# Patient Record
Sex: Female | Born: 1975 | Race: Black or African American | Hispanic: No | Marital: Married | Smoking: Never smoker
Health system: Southern US, Community
[De-identification: ages and names within clinical notes are randomized; demographics above are authoritative.]

## PROBLEM LIST (undated history)

## (undated) ENCOUNTER — Inpatient Hospital Stay (HOSPITAL_COMMUNITY): Payer: Self-pay

## (undated) DIAGNOSIS — D219 Benign neoplasm of connective and other soft tissue, unspecified: Secondary | ICD-10-CM

## (undated) DIAGNOSIS — D649 Anemia, unspecified: Secondary | ICD-10-CM

## (undated) DIAGNOSIS — N736 Female pelvic peritoneal adhesions (postinfective): Secondary | ICD-10-CM

## (undated) DIAGNOSIS — J189 Pneumonia, unspecified organism: Secondary | ICD-10-CM

## (undated) DIAGNOSIS — N9489 Other specified conditions associated with female genital organs and menstrual cycle: Secondary | ICD-10-CM

## (undated) DIAGNOSIS — Z5189 Encounter for other specified aftercare: Secondary | ICD-10-CM

## (undated) DIAGNOSIS — N805 Endometriosis of intestine: Secondary | ICD-10-CM

## (undated) DIAGNOSIS — B999 Unspecified infectious disease: Secondary | ICD-10-CM

## (undated) DIAGNOSIS — L309 Dermatitis, unspecified: Secondary | ICD-10-CM

## (undated) DIAGNOSIS — D249 Benign neoplasm of unspecified breast: Secondary | ICD-10-CM

## (undated) DIAGNOSIS — I10 Essential (primary) hypertension: Secondary | ICD-10-CM

## (undated) HISTORY — DX: Benign neoplasm of connective and other soft tissue, unspecified: D21.9

## (undated) HISTORY — PX: DILATION AND CURETTAGE OF UTERUS: SHX78

## (undated) HISTORY — PX: OTHER SURGICAL HISTORY: SHX169

## (undated) HISTORY — DX: Benign neoplasm of unspecified breast: D24.9

## (undated) HISTORY — DX: Anemia, unspecified: D64.9

## (undated) HISTORY — DX: Female pelvic peritoneal adhesions (postinfective): N73.6

## (undated) HISTORY — DX: Dermatitis, unspecified: L30.9

## (undated) HISTORY — DX: Pneumonia, unspecified organism: J18.9

## (undated) HISTORY — DX: Endometriosis of intestine: N80.5

## (undated) HISTORY — DX: Unspecified infectious disease: B99.9

---

## 2002-02-12 DIAGNOSIS — D219 Benign neoplasm of connective and other soft tissue, unspecified: Secondary | ICD-10-CM

## 2002-02-12 HISTORY — DX: Benign neoplasm of connective and other soft tissue, unspecified: D21.9

## 2003-01-08 ENCOUNTER — Observation Stay (HOSPITAL_COMMUNITY): Admission: AD | Admit: 2003-01-08 | Discharge: 2003-01-09 | Payer: Self-pay | Admitting: Obstetrics and Gynecology

## 2003-03-18 ENCOUNTER — Inpatient Hospital Stay (HOSPITAL_COMMUNITY): Admission: AD | Admit: 2003-03-18 | Discharge: 2003-03-22 | Payer: Self-pay | Admitting: Obstetrics and Gynecology

## 2003-03-19 ENCOUNTER — Encounter (INDEPENDENT_AMBULATORY_CARE_PROVIDER_SITE_OTHER): Payer: Self-pay | Admitting: Specialist

## 2003-03-23 ENCOUNTER — Encounter: Admission: RE | Admit: 2003-03-23 | Discharge: 2003-04-22 | Payer: Self-pay | Admitting: Obstetrics and Gynecology

## 2003-04-11 ENCOUNTER — Inpatient Hospital Stay (HOSPITAL_COMMUNITY): Admission: AD | Admit: 2003-04-11 | Discharge: 2003-04-11 | Payer: Self-pay | Admitting: Obstetrics and Gynecology

## 2003-04-15 ENCOUNTER — Inpatient Hospital Stay (HOSPITAL_COMMUNITY): Admission: AD | Admit: 2003-04-15 | Discharge: 2003-04-25 | Payer: Self-pay | Admitting: Obstetrics and Gynecology

## 2003-04-20 ENCOUNTER — Encounter (INDEPENDENT_AMBULATORY_CARE_PROVIDER_SITE_OTHER): Payer: Self-pay | Admitting: *Deleted

## 2003-05-21 ENCOUNTER — Encounter: Admission: RE | Admit: 2003-05-21 | Discharge: 2003-06-20 | Payer: Self-pay | Admitting: Obstetrics and Gynecology

## 2003-06-02 ENCOUNTER — Other Ambulatory Visit: Admission: RE | Admit: 2003-06-02 | Discharge: 2003-06-02 | Payer: Self-pay | Admitting: Obstetrics and Gynecology

## 2003-06-04 ENCOUNTER — Encounter: Admission: RE | Admit: 2003-06-04 | Discharge: 2003-06-04 | Payer: Self-pay | Admitting: Obstetrics and Gynecology

## 2003-07-21 ENCOUNTER — Encounter: Admission: RE | Admit: 2003-07-21 | Discharge: 2003-08-20 | Payer: Self-pay | Admitting: Obstetrics and Gynecology

## 2003-09-20 ENCOUNTER — Encounter: Admission: RE | Admit: 2003-09-20 | Discharge: 2003-10-20 | Payer: Self-pay | Admitting: Obstetrics and Gynecology

## 2003-10-21 ENCOUNTER — Encounter: Admission: RE | Admit: 2003-10-21 | Discharge: 2003-11-20 | Payer: Self-pay | Admitting: Obstetrics and Gynecology

## 2003-12-21 ENCOUNTER — Encounter: Admission: RE | Admit: 2003-12-21 | Discharge: 2004-01-20 | Payer: Self-pay | Admitting: Obstetrics and Gynecology

## 2004-02-20 ENCOUNTER — Encounter: Admission: RE | Admit: 2004-02-20 | Discharge: 2004-03-21 | Payer: Self-pay | Admitting: Obstetrics and Gynecology

## 2004-03-22 ENCOUNTER — Encounter: Admission: RE | Admit: 2004-03-22 | Discharge: 2004-04-21 | Payer: Self-pay | Admitting: Obstetrics and Gynecology

## 2005-02-12 DIAGNOSIS — I10 Essential (primary) hypertension: Secondary | ICD-10-CM

## 2005-02-12 HISTORY — DX: Essential (primary) hypertension: I10

## 2005-06-06 ENCOUNTER — Other Ambulatory Visit: Admission: RE | Admit: 2005-06-06 | Discharge: 2005-06-06 | Payer: Self-pay | Admitting: Obstetrics and Gynecology

## 2006-10-05 ENCOUNTER — Encounter: Admission: RE | Admit: 2006-10-05 | Discharge: 2006-10-05 | Payer: Self-pay | Admitting: Obstetrics and Gynecology

## 2006-11-01 ENCOUNTER — Ambulatory Visit: Admission: RE | Admit: 2006-11-01 | Discharge: 2006-11-01 | Payer: Self-pay | Admitting: Gynecology

## 2007-02-13 DIAGNOSIS — N805 Endometriosis of intestine: Secondary | ICD-10-CM

## 2007-02-13 DIAGNOSIS — N80559 Endometriosis of other parts of the colon, unspecified depth: Secondary | ICD-10-CM

## 2007-02-13 HISTORY — DX: Endometriosis of other parts of the colon, unspecified depth: N80.559

## 2007-02-13 HISTORY — DX: Endometriosis of intestine: N80.5

## 2008-03-05 ENCOUNTER — Ambulatory Visit: Payer: Self-pay | Admitting: Radiology

## 2008-03-05 ENCOUNTER — Emergency Department (HOSPITAL_BASED_OUTPATIENT_CLINIC_OR_DEPARTMENT_OTHER): Admission: EM | Admit: 2008-03-05 | Discharge: 2008-03-05 | Payer: Self-pay | Admitting: Emergency Medicine

## 2009-11-16 ENCOUNTER — Ambulatory Visit: Payer: Self-pay | Admitting: Diagnostic Radiology

## 2009-11-16 ENCOUNTER — Emergency Department (HOSPITAL_BASED_OUTPATIENT_CLINIC_OR_DEPARTMENT_OTHER): Admission: EM | Admit: 2009-11-16 | Discharge: 2009-11-16 | Payer: Self-pay | Admitting: Emergency Medicine

## 2009-11-19 ENCOUNTER — Emergency Department (HOSPITAL_BASED_OUTPATIENT_CLINIC_OR_DEPARTMENT_OTHER): Admission: EM | Admit: 2009-11-19 | Discharge: 2009-11-19 | Payer: Self-pay | Admitting: Emergency Medicine

## 2010-04-27 LAB — POCT CARDIAC MARKERS
CKMB, poc: <1 ng/mL — ABNORMAL LOW (ref 1.0–8.0)
CKMB, poc: <1 ng/mL — ABNORMAL LOW (ref 1.0–8.0)
Myoglobin, poc: 25.6 ng/mL (ref 12–200)
Myoglobin, poc: 34.2 ng/mL (ref 12–200)
Troponin i, poc: <0.05 ng/mL (ref 0.00–0.09)
Troponin i, poc: <0.05 ng/mL (ref 0.00–0.09)

## 2010-04-27 LAB — CBC
HCT: 38.6 % (ref 36.0–46.0)
Hemoglobin: 12.6 g/dL (ref 12.0–15.0)
MCH: 26.7 pg (ref 26.0–34.0)
MCHC: 32.5 g/dL (ref 30.0–36.0)
MCV: 82 fL (ref 78.0–100.0)
Platelets: 226 10*3/uL (ref 150–400)
RBC: 4.7 MIL/uL (ref 3.87–5.11)
RDW: 11.2 % — ABNORMAL LOW (ref 11.5–15.5)
WBC: 3.3 10*3/uL — ABNORMAL LOW (ref 4.0–10.5)

## 2010-04-27 LAB — BASIC METABOLIC PANEL
BUN: 11 mg/dL (ref 6–23)
CO2: 30 mEq/L (ref 19–32)
Calcium: 9.5 mg/dL (ref 8.4–10.5)
Chloride: 102 mEq/L (ref 96–112)
Creatinine, Ser: 0.8 mg/dL (ref 0.4–1.2)
GFR calc Af Amer: >60 mL/min (ref >60)
GFR calc non Af Amer: >60 mL/min (ref >60)
Glucose, Bld: 99 mg/dL (ref 70–99)
Potassium: 3.7 mEq/L (ref 3.5–5.1)
Sodium: 142 mEq/L (ref 135–145)

## 2010-04-27 LAB — DIFFERENTIAL
Basophils Absolute: 0 10*3/uL (ref 0.0–0.1)
Basophils Relative: 2 % — ABNORMAL HIGH (ref 0–1)
Eosinophils Absolute: 0.2 10*3/uL (ref 0.0–0.7)
Eosinophils Relative: 5 % (ref 0–5)
Lymphocytes Relative: 38 % (ref 12–46)
Lymphs Abs: 1.2 10*3/uL (ref 0.7–4.0)
Monocytes Absolute: 0.3 10*3/uL (ref 0.1–1.0)
Monocytes Relative: 9 % (ref 3–12)
Neutro Abs: 1.6 10*3/uL — ABNORMAL LOW (ref 1.7–7.7)
Neutrophils Relative %: 47 % (ref 43–77)

## 2010-04-27 LAB — D-DIMER, QUANTITATIVE: D-Dimer, Quant: 0.41 ug/mL-FEU (ref 0.00–0.48)

## 2010-06-27 NOTE — Consult Note (Signed)
**Note Terry-Identified via Obfuscation** Terry Bryant, Terry Bryant              ACCOUNT NO.:  [id number]   MEDICAL RECORD NO.:  [id number]          PATIENT TYPE:  OUT   LOCATION:  GYN                          FACILITY:  [facility]   PHYSICIAN:  Terry Bryant, M.D.DATE OF BIRTH:  November 10, 1975   DATE OF CONSULTATION:  11/01/2006  DATE OF DISCHARGE:  11/01/2006                                 CONSULTATION   CHIEF COMPLAINT:  Elevated CA-125 and pelvic cyst.   [age]-year-old African-American female seen in consultation request of  Dr. Estanislado Bryant regarding elevated CA-125.  The patient's history is  predominantly obtained from the patient, as records are not available to  document all of the following history.  Apparently, the patient  developed a postpartum abscess approximately 1 month postpartum in 2005.  She underwent exploratory laparotomy and removal of the left ovarian  abscess.  She subsequently developed pain in her right side, and an MRI  was obtained in August 23rd showing a serpiginous fluid-filled structure  in the right adnexa, consistent with a hydrosalpinx.  There are also 2  small fluid-filled structures which are part of the hydrosalpinx or  possibly associated with the ovary.  The right ovary is otherwise normal  except for some follicles.  It is unclear to me as to why CA-125 values  have been obtained, but they are nonetheless slightly elevated with a  value 53 units/mL in July 2008, and a repeat in August 2008 of 65  units/mL.  Aside from right lower quadrant discomfort and pain, the  patient seems to be doing well.  She has previously used birth control  pills, but more recently has a Mirena IUD in place and has irregular  menstrual periods.   PAST MEDICAL HISTORY/MEDICAL ILLNESSES:  None.   CURRENT MEDICATIONS:  None.   DRUG ALLERGIES:  None.   FAMILY HISTORY:  Negative gynecologic, breast or colon cancer.   REVIEW OF SYSTEMS:  Ten-point comprehensive review of systems negative  except as noted  above.   PHYSICAL EXAM:  Height 5 feet 6, weight 147 pounds, blood pressure  130/70, pulse 80, respiratory rate 20.  GENERAL:  The patient is a healthy African-American female in no acute  distress.  HEENT:  Negative.  NECK:  Supple without thyromegaly.  There is no supraclavicular or  inguinal adenopathy.  ABDOMEN:  Soft and nontender.  All incisions are well-healed.  No  hernias are noted.  On deep palpation there is no tenderness or rebound.  PELVIC:  Exam EGBUS, vagina, urethra are normal.  The cervix is parous.  There is an IUD string in place.  Uterus is anterior.  Normal shape,  size and consistency.  I do not palpate any adnexal masses.   IMPRESSION:  The patient with a past history of a tubo-ovarian abscess,  now with a serpiginous cystic area in the right adnexa consistent with a  hydrosalpinx.  Her CA-125 is slightly elevated, which is not surprising  given the evidence of prior infection and hydrosalpinx.  There is no  evidence that the patient has a malignancy on any of the imaging  available.  I, therefore, believe  that her elevated CA-125 and the MRI  findings of the pelvis are consistent with the sequelae of pelvic  inflammatory disease or postpartum infection.  Certainly, removal or  drainage and the hydrosalpinx might improve her pain, although certainly  would compromise any future fertility.  before undertaking any surgery,  we would recommend the patient be evaluated by a reproductive endocrine  and infertility specialist so as to give advice as to whether the tube  should be removed, or whether salpingostomy ought to be considered..  Clearly, the patient understands that if her tube is removed, she would  require in vitro fertilization for subsequent pregnancies.  I would  suggest following the CA-125 at the present time, and repeating the test  in approximately 2-3 months.  Would not suggest any further imaging  unless her CA-125 continues to rise more  significantly.      Terry Bryant, M.D.  Electronically Signed     DC/MEDQ  D:  11/03/2006  T:  11/04/2006  Job:  [id number]   cc:   Terry Bryant, M.D.  Fax: [phone]   Terry Bryant, R.N.  [address]  [place], [place] [address]

## 2010-06-30 NOTE — H&P (Signed)
NAME:  Terry Bryant, Terry Bryant                        ACCOUNT NO.:  [id number]   MEDICAL RECORD NO.:  [id number]                   PATIENT TYPE:  INP   LOCATION:  9172                                 FACILITY:  [facility]   PHYSICIAN:  Janine Limbo, M.D.            DATE OF BIRTH:  May 14, 1975   DATE OF ADMISSION:  03/18/2003  DATE OF DISCHARGE:                                HISTORY & PHYSICAL   REASON FOR ADMISSION:  The patient is a 35 year old Gravida I, Para 0 at 40  weeks who presents with definite spontaneous rupture of membranes at  approximately 10:45 a.m. today with possible leaking since yesterday.  Definitive meconium stained fluid was noticed by the patient today, but some  increased wetness was noted since yesterday.  She reports sporadic uterine  contractions and positive fetal movement.  The pregnancy has been remarkable  for:  1. PENICILLIN allergy.  2. History of anemia.  3. Hemorrhoids.  4. Fibroids.   PRENATAL LABS:  Blood type is O positive.  Rh antibody negative.  VDRL non-  reactive.  Rubella titer positive.  Hepatitis B surface antigen negative.  Sickle cell test negative.  GC and Chlamydia cultures were negative at 36  weeks.  Pap was normal.  Glucose challenge was elevated.  A three hour GGT  was normal. AP was normal.  Varicella titer is immune.  Group B Strep  culture was negative at 36 weeks.  An EDC of March 18, 2003 was  established by last menstrual period and was in agreement with an ultrasound  at approximately 13 and 18 weeks.   HISTORY OF PRESENT PREGNANCY:  The patient entered care at approximately six  weeks.  She had a fall at eight weeks while skating, but no problems from  that time.  She was seen at 11 weeks for her first visit.  The uterus was  measuring slightly a size larger than dates.  She had an ultrasound at her  next visit which showed multiple fibroids with the largest one at 4.1 cm.  She had another ultrasound done at 19 weeks that  showed normal growth and  development.  She had one episode of increased watery discharge at 26 weeks  with negative findings.  She had a positive Varicella titer noted.  Her  Glucola was 149 with a three hour GTT being normal.  She was admitted for a  23 hour observation from November 26 to January 09, 2003 for preterm labor.  Her ultrasound was normal.  She had a negative fetal fibronectin.  She was  on terbutaline.  Her cervix was outer open 1 cm and inner os closed and  soft.  She continued to have some contractions throughout the rest of her  pregnancy, but no further significant contractions.  The rest of her  pregnancy was essentially uncomplicated.  She made some indications that she  might be interested in an IUD.  However,  with her fibroids we would need to  ensure postpartum that these were not intramural fibroids.  She had an  evaluation at 48 and 1/2 weeks for an elevated blood pressure, but no  significant findings were noted on evaluation.   OB HISTORY:  The patient is a primigravida.   PAST MEDICAL HISTORY:  1. She was on Ortho-Evra in the past, but stopped in April of 2004.  2. She was treated for Chlamydia at age 35.  3. She reports the usual childhood illnesses.  4. Questionable Varicella history, but did have a positive Varicella titer.  5. History of anemia.  She had a flexible sigmoidoscopy in December of 2003     with hemorrhoids noted.  6. Occasional urinary tract infections.   ALLERGIES:  1. PENICILLIN which causes a questionable type of reaction.  2. BEE STINGS which cause swelling.   FAMILY HISTORY:  Maternal aunt has congestive heart failure, but is still  living.  Maternal grandfather had a MI, congestive heart failure and bypass  surgery.  Maternal grandmother had chronic hypertension.  Father had  questionable insulin-dependent diabetes mellitus.  Maternal grandfather also  had diabetes.  Paternal grandmother had a CVA and stroke.  A sister had   lupus.   GENETIC HISTORY:  Remarkable for the patient having twins on both sides.   SOCIAL HISTORY:  The patient is married to the father of the baby and he is  involved and supportive.  His name is Itzia Cunliffe.  The patient is [place] and of the [place] faith.  She has been followed by the certified  nurse midwife service at [facility].  She denies any alcohol, drug  or tobacco use during this pregnancy.  She has two years of college.  She is  employed in Manufacturing engineer.  Her husband has one year of college and  he is employed at [employer].   PHYSICAL EXAMINATION:  VITAL SIGNS:  Vital signs are stable and she is  afebrile.  HEENT:  Within normal limits.  LUNGS:  Breath sounds are clear.  HEART:  Regular rate and rhythm without murmur.  BREASTS:  Soft and non-tender.  ABDOMEN:  Fundal height is approximately 38 cm.  Estimated fetal weight is 7  to 7.5 pounds.  Uterine contractions are every five minutes, mild to  moderate quality.  CERVICAL EXAM:  Cervical exam was performed at the office.  She was 1, 60%  with a probable vertex and a -3 station and ballotable.  PELVIC EXAM:  Deferred when she came to the hospital, but a bedside  ultrasound was performed verifying vertex presentation.  The patient is  noted to be leaking light meconium stained fluid.  Fetal heart rate is  reactive with no decelerations.  EXTREMITIES:  Deep tendon reflexes are 2+ without clonus.  There is a trace  edema noted.   IMPRESSION:  1. Intrauterine pregnancy at 40 weeks.  2. Spontaneous rupture of membranes with questionable prolonged leaking.  3. Negative Group B Strep.  4. Meconium stained fluid.   PLAN:  1. Admit to birthing suite for consult with Dr. Stefano Gaul who is the     attending physician.  2. Routine certified nurse midwife orders.  3. Plan IV antibiotics with clindamycin 900 mg q.8h.  4. IV pain medication and epidural p.r.n. 5. We will plan IUPC insertion with next  cervical exam for amnioinfusion for     better monitoring of contractions.     [name] [name],  C.N.M.                   Janine Limbo, M.D.   Leeanne Mannan  D:  03/18/2003  T:  03/18/2003  Job:  [id number]

## 2010-06-30 NOTE — Discharge Summary (Signed)
Terry Bryant, Terry Bryant                        ACCOUNT NO.:  [id number]   MEDICAL RECORD NO.:  [id number]                   PATIENT TYPE:  INP   LOCATION:  9162                                 FACILITY:  [facility]   PHYSICIAN:  Naima A. Dillard, M.D.              DATE OF BIRTH:  1975/11/02   DATE OF ADMISSION:  01/08/2003  DATE OF DISCHARGE:  01/09/2003                                 DISCHARGE SUMMARY   ADMISSION DIAGNOSES:  1. Intrauterine pregnancy 30-2/7 weeks.  2. Preterm uterine contractions.   DISCHARGE DIAGNOSES:  1. Intrauterine pregnancy 30-2/7 weeks.  2. Preterm uterine contractions.   PROCEDURE:  None.   HOSPITAL COURSE:  Ms. Pulido is a 35 year old gravida 1, para 0, at 30-2/7  weeks who presented with right lower quadrant pain for two days on the  evening of January 08, 2003. Her pregnancy was remarkable for: 1)  Penicillin allergy; 2) Anemia; 3) Hemorrhoids; 4) Fibroids. On admission,  the patient was having contractions every two to three minutes initially.  She received terbutaline subcu and they then spaced out somewhat to every  four to five minutes. Cervix was fingertip, external os closed, internal os  25%,  and presentation was at a -3 station. GC, Chlamydia, and group B strep  cultures were done.  Fetal  fibronectin was done, and a UA and CBC were  done. The fetal fibronectin was negative; however, the patient continued to  contract. She was then admitted for overnight observation. By the morning of  January 09, 2003, she was still having contractions every nine minutes with  irritability between. She had had two variables at approximately 7 a.m., but  fetal heart rate had been reactive prior to and subsequently to that. She  had been maintained on 5 mg of p.o. terbutaline through the night. She had  an ultrasound subsequently that showed an intrauterine pregnancy vertex with  an estimated fetal weight in the 75th to 90th percentile. Cervix was 4 cm  long,  fluid was 15.3, BPP was 6/8 with no fetal breathing movements, but was  8/10 with an inclusion of reactive NST. 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 Prior to going to ultrasound, fetal heart rate was  reactive with minimal contractions. Upon return to ultrasound she had a  segment of contractions very two to four minutes. She did receive one dose  of subcu terbutaline with resolution of that pattern. A consultation was  held with Dr. Normand Sloop. The patient was deemed to have received full benefit  of her hospital stay and was discharged home.   DISCHARGE INSTRUCTIONS:  The patient is to be on bedrest. She is given a  note to be out of work for January 11, 2003, and January 12, 2003. She is  to maintain pelvic rest.   DISCHARGE MEDICATIONS:  Terbutaline 5 mg one p.o. q.4h. around-the-clock  while awake, prenatal vitamins one p.o. daily.  DISCHARGE FOLLOWUP:  On January 12, 2003, with an  appointment at [facility]. Signs and symptoms of preterm labor were also reviewed with the  patient.     [name] [name], C.N.M.                   Naima A. Normand Sloop, M.D.    Leeanne Mannan  D:  01/09/2003  T:  01/09/2003  Job:  [id number]

## 2010-06-30 NOTE — Discharge Summary (Signed)
Terry Bryant, Terry Bryant                        ACCOUNT NO.:  [id number]   MEDICAL RECORD NO.:  [id number]                   PATIENT TYPE:  INP   LOCATION:  9116                                 FACILITY:  [facility]   PHYSICIAN:  Terry Fat. Bryant, M.D.              DATE OF BIRTH:  Jan 01, 1976   DATE OF ADMISSION:  03/18/2003  DATE OF DISCHARGE:  03/22/2003                                 DISCHARGE SUMMARY   ADMISSION DIAGNOSES:  1. Intrauterine pregnancy at term.  2. Spontaneous rupture of the membranes.  3. Negative group B Streptococcus.  4. Meconium-stained fluid.   DISCHARGE DIAGNOSES:  1. Intrauterine pregnancy at term.  2. Spontaneous rupture of the membranes.  3. Negative group B Streptococcus.  4. Meconium-stained fluid.  5. Status post low transverse cesarean section.  6. Failure to progress in labor.  7. Breast feeding.  8. Plans oral contraceptives for birth control.   PROCEDURE THIS ADMISSION:  Primary low transverse cesarean section for  delivery of a viable female infant named Terry Bryant who had Apgars of 7 and 9  and weighed 6 pounds 6 ounces on March 19, 2003 attended in delivery by  Dr. Osborn Bryant and Terry Bryant, C.N.M.   HOSPITAL COURSE:  Terry Bryant is a 35 year old black female gravida 1 para 0  at [redacted] weeks gestation who presented on March 18, 2003 with spontaneous  rupture of the membranes and meconium-stained fluid, possibly with rupture  since March 17, 2003.  She was admitted to labor and delivery at [facility] and started on IV antibiotics secondary to possibly prolonged  rupture of the membranes.  She was also started on Pitocin augmentation per  low-dose protocol and progressed in labor to approximately 6 cm by 3 a.m. on  March 19, 2003. S he had an IUPC at this time and was noted to have  adequate labor and was continued with adequate labor throughout the night  and by 6:45 a.m. was still 6 cm, 70%, with increased edema of the cervix  and  the vertex still at a -1 to -2 station.  At that time the fetal heart rate  was reassuring and the patient was offered another hour of observation prior  to proceeding with cesarean section for failure to progress.  However, about  45 minutes later the fetal heart rate started developing variables with late  return and her cervix was reexamined and was noted to have still made no  more change, and the patient was recommended at this point to proceed with  cesarean section secondary to failure to progress.  The Pitocin was  discontinued and the fetal heart rate was more reassuring subsequently.  The  patient then did proceed with a cesarean section for delivery of a viable  female infant named Terry Bryant who weighed 6 pounds 6 ounces and had Apgars of  7 and 9.  The delivery was attended by Dr. Marylene Land  Su Bryant and Terry Bryant, C.N.M.  The infant was in an OP presentation at delivery.  Please  see operative note for further details.  Postoperatively the patient has  done well.  She is ambulating, voiding, and eating without difficulty.  Her  vital signs are stable and she was afebrile throughout her hospital stay.  She did have orthostatic vital signs performed secondary to anemia and they  were stable.  She is breastfeeding without difficulty.  She plans to use  oral contraceptives at this time but is considering the possibility of an  IUD in the future.  She is deemed ready for discharge today.   DISCHARGE INSTRUCTIONS:  As per the [facility] OB/GYN handout.   DISCHARGE MEDICATIONS:  1. Motrin 600 mg p.o. q.6h. p.r.n. for pain.  2. Tylox one to two p.o. q.4-6h. p.r.n. for pain.  3. Prenatal vitamins daily.  4. Iron supplement daily.   DISCHARGE LABORATORY DATA:  Her hemoglobin is 7.5, wbc count is 24.2,  platelets are 211.   DISCHARGE FOLLOW-UP:  Will be in 4-6 weeks at [facility] OB[facility]N or  p.r.n.     Terry Bryant, C.N.M.              Terry Fat  Bryant, M.D.    Terry Bryant  D:  03/22/2003  T:  03/22/2003  Job:  [id number]

## 2010-06-30 NOTE — Op Note (Signed)
[name], [name]                        ACCOUNT NO.:  [id number]   MEDICAL RECORD NO.:  [id number]                   PATIENT TYPE:  INP   LOCATION:  9318                                 FACILITY:  [facility]   PHYSICIAN:  Crist Fat. Rivard, M.D.              DATE OF BIRTH:  Oct 06, 1975   DATE OF PROCEDURE:  04/20/2003  DATE OF DISCHARGE:                                 OPERATIVE REPORT   PREOPERATIVE DIAGNOSIS:  Probable left tubo-ovarian abscess.   POSTOPERATIVE DIAGNOSES:  1. Left tubo-ovarian abscess.  2. Pelvic adhesions.  3. Right ovarian simple cyst.   ANESTHESIA:  General.   PROCEDURES:  1. Laparoscopy.  2. Exploratory laparotomy.  3. Lysis of adhesions.  4. Left salpingo-oophorectomy.   SURGEON:  Crist Fat. Rivard, M.D.   ASSISTANTRenaldo Reel. Latham, C.N.M.   ESTIMATED BLOOD LOSS:  500 mL.   DESCRIPTION OF PROCEDURE:  After being informed of the planned procedure  with possible complications including bleeding, infection, injury to other  organs, need for laparotomy, informed consent was obtained.  The patient is  taken to OR #8, given general anesthesia with endotracheal intubation  without any complication.  She is placed in the lithotomy position, prepped  and draped in a sterile fashion.  A Foley catheter is inserted in her  bladder and an intrauterine manipulator is placed in her uterus.  We proceed  with infiltration of the umbilical area using 5 mL of Marcaine 0.25%,  perform a semi-elliptical incision, insert Veress needle, insufflate  pneumoperitoneum with a maximum pressure of 15 mmHg of CO2.  We remove the  Veress needle and insert the 10 mm trocar.  We can then insert the  laparoscope mounted on video monitor.   Observation:  Upon entry we note adhesions covering the whole lower abdomen  from the umbilicus to the cul-de-sac involving the bowels with the anterior  abdominal wall, left and right pelvic wall, and it is completely impossible  for [place] to go  around these adhesions and definitely impossible to reach the  pelvis.  The scope and the 10 mm trocar are then removed.  The incision is  closed with subcuticular suture of 4-0 Vicryl, and the decision is made to  proceed with laparotomy.  The patient is repositioned in dorsal decubitus  position, and we perform a midline incision from umbilicus to symphysis  pubis.  This is brought down to the fascia.  The fascia is incised in a  midline fashion and peritoneum is entered bluntly.  We can now complete the  incision down to the bladder flap, place self-retaining retractor, and  proceed with systematic lysis of all adhesions to be able to pack the bowels  back up in the upper abdomen and to evaluate the pelvis.  This is done both  bluntly and sharply and is actually fairly simple since these adhesions are  fairly young.  We are then able  to locate the left tube, which is grossly  dilated to about three times its normal diameter.  It is stuck into a thick  adhesion process with the posterior cul-de-sac and with the ovary.  With  blunt dissection we are able to free both the tube and the ovary and by the  same token, we rupture the ovarian abscess, which has a significant amount  of purulent material.  This is cultured both for aerobes and anaerobes.  There is very little, if any, healthy ovarian tissue and the tube is in poor  condition, the mesosalpinx having been torn during the dissection.  Decision  is made to proceed with left salpingo-oophorectomy after we visualize the  right adnexa, which shows a normal right tube and a right ovary with a small  simple cyst of about 2 cm.  The infundibulopelvic ligament on the left is  isolated and clamped with Rogers clamp to reduce the bleeding from the blunt  dissection, and the tubo-ovarian ligament and tube are then clamped with a  Rogers clamp as well.  Before sectioning, we isolate the round ligament,  clamp, section, and suture it, which allows  [place] entry into the  retroperitoneum to evaluate the location of the left ureter.  It is very  difficult to visualize the left ureter due to the inflammatory process and  thickening of all tissue surrounding the area, but we are able to palpate it  away from the IP ligament and we feel confident we can section this ligament  safely.  This is performed with Mayo scissors.  The IP ligament is then  sutured with a transfix suture of 0 Vicryl.  The tube and tubo-ovarian  ligament are sectioned.  The tube and ovary are removed, and this pedicle is  sutured with a transfix suture of 0 Vicryl as well.  A dose of indigo  carmine is given to the patient IV to further elaborate on the integrity of  the left ureter.  We then proceed with profuse irrigation of the pelvis with  warm saline until clear return.  We note a site of bleeding on the left  round ligament, which is again clamped and sutured, which controls the  bleeding.  There is a little bit of oozing in the posterior cul-de-sac,  which greatly reduces with compression and warm irrigation.  We turn our  attention to the right ovary, and cautery allows [place] to just drain the small  cyst.  No further treatment is performed on that ovary.  A sheet of  Interceed is placed on the uterus, all the surface of dissection of  adhesions with the bowel to reduce the risk of recurrence of adhesions, and  a sheet of Gelfoam is placed in the posterior cul-de-sac as well as the  anterior cul-de-sac to reduce bleeding.  We then irrigate again with warm  saline.  We are satisfied with hemostasis, and we remove our abdominal  packs.  The bowel is then evaluated for all the areas where adhesions were  sectioned and we confirm integrity of the bowel.  We then remove retractors  and proceed with closure of the abdominal wall.  The fascia is closed with  two running suture of 1 Vicryl meeting midline.  The wound is irrigated with warm saline and the skin is closed with  staples.   Instrument and sponge count is complete x2.  Estimated blood loss is 500 mL,  and the patient received two units of packed red cells  during the procedure  due to her preop hemoglobin being 7.7.  Instrument and sponge count is  complete x2.  The procedure is well-tolerated by the patient, who is taken  to the recovery room in a well and stable condition.  The intrauterine  manipulator has been removed, though the Foley is kept in place.                                               Crist Fat Rivard, M.D.    SAR/MEDQ  D:  04/20/2003  T:  04/21/2003  Job:  [id number]

## 2010-06-30 NOTE — Op Note (Signed)
Terry Bryant, Terry Bryant                        ACCOUNT NO.:  [id number]   MEDICAL RECORD NO.:  [id number]                   PATIENT TYPE:  INP   LOCATION:  9116                                 FACILITY:  [facility]   PHYSICIAN:  Osborn Coho, M.D.                DATE OF BIRTH:  03/24/1975   DATE OF PROCEDURE:  03/19/2003  DATE OF DISCHARGE:                                 OPERATIVE REPORT   PREOPERATIVE DIAGNOSIS:  1. Term intrauterine pregnancy.  2. Arrest of dilation.   POSTOPERATIVE DIAGNOSIS:  1. Term intrauterine pregnancy.  2. Arrest of dilation.   PROCEDURE:  Primary low transverse cesarean section via Pfannenstiel skin  incision.   ANESTHESIA:  Epidural.   ATTENDING PHYSICIAN:  Osborn Coho, M.D.   ASSISTANT:  Concha Pyo. Duplantis, C.N.M.   FLUIDS REPLACED:  2200 mL.   ESTIMATED BLOOD LOSS:  1500 mL.   URINE OUTPUT:  200 mL.   COMPLICATIONS:  None.   FINDINGS:  Live female infant with Apgars of 7 at one minute and 9 at five  minutes.  Weight 6 pounds 6 ounces.  Approximately 2 cm intramural fibroid  right lateral uterus.   DESCRIPTION OF PROCEDURE:  The patient was taken to the operating room after  the risks, benefits and alternatives discussed with the patient and the  patient verbalized understanding and consent signed and witnessed.  The  patient was given a surgical level via the epidural and prepped and draped  in the normal sterile fashion.  A Pfannenstiel skin incision was made and  carried through to the underlying layer of fascia with the Bovie.  The  fascia was extended bilaterally with the Mayo scissors.  The muscle was  separated in the midline and the peritoneum entered bluntly and extended  manually.  The bladder blade was placed and the bladder flap created with  Metzenbaum scissors.  The uterine incision was made with the scalpel and  extended bilaterally with the bandage scissors.  The infant was delivered  without difficulty.  There was a  full body cord noted starting around the  nuchal region.  The infant was handed to the awaiting pediatricians.  The  placenta was removed manually and sent to pathology.  The placenta tore off  in pieces as it was being manually removed.  The uterus was cleared of all  clots and debris.  There was a significant amount of bleeding noted and 40  units of Pitocin were needed for the uterus to become firm.  There was an  approximately 2 cm fibroid at the right aspect of the uterine incision in  the intramural layer.  The uterine incision  was repaired with 0 Vicryl in a running locked fashion  and a second imbricating layer was performed.  Several figure-of-eight 0  Vicryl stitches were placed for hemostasis.  Irrigation was performed.  The  peritoneum was repaired with 3-0 chromic in a  running fashion after the  uterine incision was noted to be hemostatic.  The fascia was repaired with 0  Vicryl in a running locked fashion.  The subcutaneous tissue was made  hemostatic with the Bovie after irrigation.  The skin was closed with  staples.  The patient tolerated the procedure well and returns to the  recovery room in stable condition.                                               Osborn Coho, M.D.    AR/MEDQ  D:  03/19/2003  T:  03/19/2003  Job:  [id number]

## 2010-06-30 NOTE — Discharge Summary (Signed)
Terry Bryant, Terry Bryant                        ACCOUNT NO.:  [id number]   MEDICAL RECORD NO.:  [id number]                   PATIENT TYPE:  INP   LOCATION:  9318                                 [facility]ACILITY:  [facility]   PHYSICIAN:  Naima A. Dillard, M.D.              DATE OF BIRTH:  1975/09/09   DATE OF ADMISSION:  04/15/2003  DATE OF DISCHARGE:  04/25/2003                                 DISCHARGE SUMMARY   ADMITTING DIAGNOSES:  1. Possible left pelvic abscess and right hemorrhagic cyst.  2. Status post cesarean section x4 weeks.   DISCHARGE DIAGNOSES:  1. Left tuboovarian abscess.  2. Pelvic adhesions.  3. Right ovarian simple cyst.  4. Anemia.   PROCEDURES:  1. Laparoscopic procedure.  2. Exploratory laparotomy.  3. Lysis of adhesions.  4. Left salpingo-oophorectomy.   HOSPITAL COURSE:  Terry Bryant is a 36 year old gravida 1 para 1 at  approximately 4 weeks post C-section who was evaluated at [facility] for pain and fever.  She had never had a fever in her postoperative stay  following her cesarean section and was discharged home in  the usual manner.  She had been evaluated this past weekend for a suture noted from the vagina  and that suture was evaluated and actually cut at the length of the cervix.  She denied any fever and pain at that time.  The fever and pain started on  April 15, 2003.  She denied any other problems.  On admission she had left  lower quadrant tenderness.  There was positive rebound on the left and  minimal guarding.  Her incision was clean, dry, and intact.  She had right  adnexal tenderness.  She had an ultrasound that showed a right hemorrhagic  cyst about 3-4 cm and a left ovarian abscess about 4 cm.  The patient was  admitted for IV therapy.  She was started on gentamycin and clindamycin.  She began to have less pin the next day.  Blood cultures were negative.  GC  and chlamydia cultures were negative.  However, over the next several days  she  continued to spike fevers.  A CT scan was done showing bilateral pelvic  adnexal fluid collections.  She remained with a normal white blood cell  count.  Temperature went up to a maximum of 103.2.  Infectious disease was  consulted on hospital day #2.  Zosyn was begun and gentamycin and  clindamycin were discontinued.  Infectious disease official consult was done  with a recommendation to continue Zosyn.  On March 7 she continued to spike  temperatures.  She was made n.p.o. after midnight that night for surgery the  next day by Dr. Estanislado Pandy.  That was done on March 8.  A laparoscopic procedure  was attempted; however, it was unable to be done secondary to inability to  view pelvic structures due to adhesions.  An exploratory laparotomy, lysis  of adhesions, and left salpingo-oophorectomy were done.  Findings were a  left tuboovarian abscess, pelvic adhesions, and a right ovarian simple cyst.  The patient received 2 units packed red blood cells during surgery due to  hemoglobin of approximately 7.7.  She had some diarrhea prior to that time.  C. difficile cultures were done and were negative.  She continued on the  Zosyn.  The abscess culture E. coli.  The white blood cell count remained  11.6, hemoglobin was stable.  The plan was made to keep her until she had  been afebrile for 48 hours.  She began to have some diarrhea on  postoperative day #2 and C. difficile toxin assay were negative.  Cultures  remained E. coli sensitive to the antibiotics that she was on.  Over the  next few days she continued to have some temperature spikes; however, she  began to feel better.  She was placed on Augmentin.  By April 24, 2003 she  had been afebrile for 24 hours.  The plan was made to evaluate her on the  following day for possible discharge.   On April 25, 2003 which was postoperative day #5 and day #10 of antibiotics,  the patient was doing well.  She was up ad lib, she was tolerating a regular  diet.   She did have some slight swelling in the left side of her abdomen;  however, this was soft and nontender.  She denied any nausea, vomiting, or  diarrhea.  She did have some oral thrush with white patches on her tongue  but no pain.  Her incision was intact with staples intact.  She had been  afebrile for 48 hours.  Dr. Normand Sloop was consulted and the decision was made  to discharge the patient home.  HIV testing was done prior to her discharge;  results are currently pending. The patient was deemed to have received the  full benefit of her hospital stay and was discharged home.   DISCHARGE INSTRUCTIONS:  The patient is to continue to rest.   DISCHARGE MEDICATIONS:  1. Augmentin 875 mg p.o. b.i.d. for 12 more days for a total of 21 days.  2. Nystatin oral solution 5 mL q.6h. p.r.n. oral thrush.  3. Prenatal vitamins one p.o. daily.  4. Motrin 600 mg p.o. q.6h. p.r.n. pain.  5. Tylox one to two p.o. q.3-4h. p.r.n. pain.   DISCHARGE FOLLOW-UP:  On Tuesday, April 27, 2003 in the office for staple  removal.  The patient is to call with any fever above 100.4, significant  abdominal pain, or any other problems.     [name] [name], C.N.M.                   Naima A. Normand Sloop, M.D.    Leeanne Mannan  D:  04/25/2003  T:  04/26/2003  Job:  [id number]

## 2010-06-30 NOTE — H&P (Signed)
NAME:  Terry Bryant, START                        ACCOUNT NO.:  [id number]   MEDICAL RECORD NO.:  [id number]                   PATIENT TYPE:  INP   LOCATION:  9318                                 FACILITY:  [facility]   PHYSICIAN:  Osborn Coho, M.D.                DATE OF BIRTH:  1975/07/27   DATE OF ADMISSION:  04/15/2003  DATE OF DISCHARGE:                                HISTORY & PHYSICAL   HISTORY OF PRESENT ILLNESS:  Terry Bryant presents today complaining of lower  abdominal pain that started this morning and was evaluated at [facility] office for this pain and fever that she noted that started  this morning.  She is status post cesarean section on March 19, 2003, for  failure to progress in labor, having reached 6 cm and failing to progress  beyond that and developed a nonreassuring fetal heart rate tracing also.  She did receive antibiotics postpartum and was never febrile in her  postoperative stay.  She was also evaluated this past weekend for a suture  noted from her vagina and that suture was evaluated and actually cut at the  length of the cervix.  She denied any pain at that time and denied any fever  at the time when she was seen on the weekend, but reports that pain and  fever have started today.  She denies any nausea, vomiting, headaches, or  visual disturbances.  She denies any urinary symptoms or constipation also.  Upon discharge she was breast feeding her infant.   OB-GYN HISTORY:  She is gravida 1, para 1-0-0-1 now and four weeks'  postpartum.  She, as previously mentioned, just had a cesarean section for  delivery on March 19, 2003, for failure to progress.  She used to be on  Ortho-Evra, stopped that in April of 2004 and plans to go on oral  contraceptives following her six-week checkup.  She was diagnosed with  Chlamydia at age 88 and was treated for that, has had no problems since that  time.   PAST MEDICAL HISTORY:  1. She reports having had the  usual childhood diseases.  2. She reports a history of anemia.  3. History of hemorrhoids and flexible sigmoidoscopy in December of 2003.  4. History of occasional urinary tract infection.   ALLERGIES:  She is allergic to PENICILLIN, but cannot remember what kind of  reaction she gets.   FAMILY HISTORY:  Significant for maternal aunt with congestive heart failure  and maternal grandfather with MI, congestive heart failure, and history of  bypass surgery, maternal grandmother with hypertension, father with insulin-  dependent diabetes, maternal grandmother with CVA, and sister with lupus.   SOCIAL HISTORY:  She is married to Terry Bryant who is involved and  supportive.  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  They are of the [place] faith.  They deny  any illicit drug use,  alcohol, or smoking.   PHYSICAL EXAMINATION:  VITAL SIGNS:  Temperature is 101.2.  Her other vital  signs are stable.  LUNGS:  Clear.  HEART:  Regular rate and rhythm.  BREASTS:  Soft and nontender.  ABDOMEN:  Soft with bowel sounds bilaterally.  Left lower quadrant  tenderness.  Left is greater than right and positive rebound on the left  with minimal guarding.  Her incision is clean and dry and well-healed.  PELVIC:  Speculum exam reveals mild uterine bleeding.  Os is closed.  Uterus  feels about 9-10 weeks' size and has right adnexal tenderness and increased  tenderness on the left.  She had ultrasound at the office today that showed  a right hemorrhagic cyst about 3-4 cm and a left ovarian abscess about 4 cm.   ASSESSMENT:  A 35 year old with left pelvic abscess and right hemorrhagic  cyst, status post cesarean section x4 weeks.   PLAN:  Dr. Su Hilt is to admit for IV antibiotics and to follow her closely.     Concha Pyo. Duplantis, C.N.M.              Osborn Coho, M.D.    SJD/MEDQ  D:  04/15/2003  T:  04/15/2003  Job:  [id number]

## 2010-06-30 NOTE — H&P (Signed)
Terry Bryant, Terry Bryant                        ACCOUNT NO.:  [id number]   MEDICAL RECORD NO.:  [id number]                   PATIENT TYPE:  INP   LOCATION:  9162                                 [facility]ILITY[facility]  [facility]   PHYSICIAN:  Naima A. Dillard, M.D.              DATE OF BIRTH:  08-22-1975   DATE OF ADMISSION:  01/08/2003  DATE OF DISCHARGE:                                HISTORY & PHYSICAL   BRIEF HISTORY:  This is a 35 year old gravida 1, para 0 at 30-2/7 weeks who  presents with complaints of right lower quadrant pain for 2 days.  She  denies leaking or bleeding and reports positive fetal movement.  Pregnancy  has been remarkable for (1) PENICILLIN allergy, (2) anemia, (3) hemorrhoids,  (4) fibroids - largest of which is 4 cm.  She presented this evening to the  maternity admissions and was found to be having contractions every 2-3  minutes.  She was given terbutaline twice with a slight reduction in  contractions but the contractions persisted every 4-5 minutes.  Her cervix  was minimally changed but decision was made to admit the patient for  monitoring and ultrasound in the morning.   PAST OBSTETRICAL HISTORY:  Patient is a primigravida.   MEDICAL HISTORY:  Remarkable for history of Chlamydia at age 82, occasional  yeast infections, history of anemia for which she uses iron, history of  hemorrhoids.   FAMILY HISTORY:  Remarkable for an aunt and grandfather with heart disease,  a grandmother with hypertension, father with diabetes and a grandmother with  stroke and a sister with lupus.   GENETIC HISTORY:  Remarkable for twins on both sides of the family.   PRENATAL LABORATORIES:  Hemoglobin 12.1, platelets 250.  Blood type O  positive, antibody screen negative, sickle cell negative, RPR nonreactive,  hepatitis negative, HIV declined, Pap smear normal, gonorrhea negative,  Chlamydia negative, cystic fibrosis negative.   SOCIAL HISTORY:  Patient is married to [name] who is  involved and  supportive.  She is of the [employer].  She works as a Archivist.  She denies any alcohol, tobacco, or drug use.   OBJECTIVE DATA:  VITAL SIGNS:  Stable, afebrile.  HEENT:  Within normal limits.  THYROID:  Normal, not enlarged.  CHEST:  Clear to auscultation.  HEART RATE:  Regular rate and rhythm.  ABDOMEN:  Gravid at 30 cm.  Unsure presentation.  Fetal monitor denotes a  reactive fetal heart rate with uterine contractions currently every 4-5  minutes despite two doses of terbutaline.  CERVICAL:  Fingertip dilated and the external os closed and the internal os  25% effaced, -3 station with unsure presentation.  Fetal fibronectin was  negative.  GC, Chlamydia, and group B strep are pending.  Urinalysis shows  specific gravity 1.010 and all parameters are negative.  CBC shows a white  count of 7.4, hemoglobin of 11.2, and platelets  of 224.  EXTREMITIES:  Within normal limits.   ASSESSMENT:  1. Intrauterine pregnancy at 30-2/7 weeks.  2. Preterm labor with minimal cervical change which is persistent.  3. Fetal fibronectin negative.   PLAN:  1. Admit to antenatal unit per Dr. Normand Sloop.  2. Routine MD orders.  3. Continuous fetal monitoring.  4. Terbutaline 5 mg p.o. q.4h.  5. Ambien p.r.n.  6. Ultrasound in the morning to assess cervical length and fetal status and     further assessment to occur in the morning.     Marie L. Williams, C.N.M.                 Naima A. Normand Sloop, M.D.    MLW/MEDQ  D:  01/08/2003  T:  01/08/2003  Job:  [id number]

## 2010-12-29 ENCOUNTER — Other Ambulatory Visit: Payer: Self-pay | Admitting: Obstetrics and Gynecology

## 2010-12-29 DIAGNOSIS — N6320 Unspecified lump in the left breast, unspecified quadrant: Secondary | ICD-10-CM

## 2011-01-03 ENCOUNTER — Emergency Department (INDEPENDENT_AMBULATORY_CARE_PROVIDER_SITE_OTHER): Payer: Self-pay

## 2011-01-03 ENCOUNTER — Emergency Department (HOSPITAL_BASED_OUTPATIENT_CLINIC_OR_DEPARTMENT_OTHER)
Admission: EM | Admit: 2011-01-03 | Discharge: 2011-01-03 | Disposition: A | Discharge status: Home / Self Care | Payer: Self-pay | Attending: Emergency Medicine | Admitting: Emergency Medicine

## 2011-01-03 ENCOUNTER — Other Ambulatory Visit: Payer: Self-pay

## 2011-01-03 ENCOUNTER — Encounter: Payer: Self-pay | Admitting: Family Medicine

## 2011-01-03 DIAGNOSIS — I10 Essential (primary) hypertension: Secondary | ICD-10-CM | POA: Insufficient documentation

## 2011-01-03 DIAGNOSIS — M549 Dorsalgia, unspecified: Secondary | ICD-10-CM

## 2011-01-03 DIAGNOSIS — M546 Pain in thoracic spine: Secondary | ICD-10-CM | POA: Insufficient documentation

## 2011-01-03 HISTORY — DX: Essential (primary) hypertension: I10

## 2011-01-03 MED ORDER — KETOROLAC TROMETHAMINE 60 MG/2ML IM SOLN
60.0000 mg | Freq: Once | INTRAMUSCULAR | Status: AC
  Administered 2011-01-03: 60 mg via INTRAMUSCULAR
  Filled 2011-01-03: qty 2

## 2011-01-03 MED ORDER — HYDROCODONE-ACETAMINOPHEN 5-500 MG PO TABS: 1.0000 | ORAL_TABLET | Freq: Four times a day (QID) | ORAL | Status: AC | PRN

## 2011-01-03 MED ORDER — HYDROCODONE-ACETAMINOPHEN 5-325 MG PO TABS
1.0000 | ORAL_TABLET | Freq: Once | ORAL | Status: AC
  Administered 2011-01-03: 1 via ORAL
  Filled 2011-01-03: qty 1

## 2011-01-03 NOTE — ED Provider Notes (Signed)
History     CSN: [id number] Arrival date & time: 01/03/2011 10:25 AM   First MD Initiated Contact with Patient 01/03/11 1049      Chief Complaint  Patient presents with  . Back Pain    (Consider location/radiation/quality/duration/timing/severity/associated sxs/prior treatment) Patient is a 35 y.o. female presenting with back pain.  Back Pain  This is a new problem. The current episode started 2 days ago. The problem occurs constantly. The problem has been gradually worsening. The pain is associated with no known injury. The pain is present in the thoracic spine. The quality of the pain is described as aching. The pain does not radiate. The pain is severe. The symptoms are aggravated by bending and twisting. The pain is the same all the time. Pertinent negatives include no fever, no abdominal pain, no abdominal swelling, no dysuria, no pelvic pain, no paresthesias, no tingling and no weakness. She has tried NSAIDs for the symptoms. The treatment provided mild relief.    Past Medical History  Diagnosis Date  . Hypertension     Past Surgical History  Procedure Date  . Tubal ligation   . Cesarean section     No family history on file.  History  Substance Use Topics  . Smoking status: Never Smoker   . Smokeless tobacco: Not on file  . Alcohol Use: No    OB History    Grav Para Term Preterm Abortions TAB SAB Ect Mult Living                  Review of Systems  Constitutional: Negative for fever.  Gastrointestinal: Negative for abdominal pain.  Genitourinary: Negative for dysuria and pelvic pain.  Musculoskeletal: Positive for back pain.  Neurological: Negative for tingling, weakness and paresthesias.  All other systems reviewed and are negative.    Allergies  Penicillins  Home Medications   Current Outpatient Rx  Name Route Sig Dispense Refill  . NIFEDIPINE ER OSMOTIC 30 MG PO TB24 Oral Take 30 mg by mouth daily.        BP 144/99  Pulse 72  Temp(Src) 98.1  F (36.7 C) (Oral)  Ht 5\' 6"  (1.676 m)  Wt 150 lb (68.04 kg)  BMI 24.21 kg/m2  SpO2 100%  Physical Exam  Nursing note and vitals reviewed. Constitutional: She is oriented to person, place, and time. She appears well-developed and well-nourished. No distress.  HENT:  Head: Normocephalic and atraumatic.  Neck: Normal range of motion. Neck supple.  Cardiovascular: Normal rate and regular rhythm.  Exam reveals no gallop and no friction rub.   No murmur heard. Pulmonary/Chest: Effort normal and breath sounds normal. No respiratory distress. She has no wheezes.  Abdominal: Soft. She exhibits no distension. There is no tenderness.  Musculoskeletal: Normal range of motion.       There is ttp in the soft tissues of the thoracic region.  There is no bony ttp or stepoffs.  Neurological: She is alert and oriented to person, place, and time.  Skin: Skin is warm and dry. She is not diaphoretic.    ED Course  Procedures (including critical care time)  Labs Reviewed - No data to display No results found.   No diagnosis found.   Date: 01/03/2011  Rate: 67  Rhythm: normal sinus rhythm  QRS Axis: normal  Intervals: normal  ST/T Wave abnormalities: normal  Conduction Disutrbances:none  Narrative Interpretation:   Old EKG Reviewed: unchanged    MDM  Pain appears to be musculoskeletal in  etiology.  Sats are 100%, HR 72, doubt PE.  EKG looks okay.  Will discharge with nsaids, pain meds, time.  Return prn.        Geoffery Lyons, MD 01/03/11 1210

## 2011-01-03 NOTE — ED Notes (Signed)
Pt c/o upper back pain x 3 days. Pt sts pain worse with movement and deep inspiration. Pt denies injury, denies cough, fever, n/v, cp.

## 2011-01-12 ENCOUNTER — Other Ambulatory Visit: Payer: Self-pay | Admitting: Obstetrics and Gynecology

## 2011-01-12 ENCOUNTER — Ambulatory Visit
Admission: RE | Admit: 2011-01-12 | Discharge: 2011-01-12 | Disposition: A | Discharge status: Home / Self Care | Payer: Self-pay | Source: Ambulatory Visit | Attending: Obstetrics and Gynecology | Admitting: Obstetrics and Gynecology

## 2011-01-12 ENCOUNTER — Other Ambulatory Visit: Payer: Self-pay

## 2011-01-12 DIAGNOSIS — N6320 Unspecified lump in the left breast, unspecified quadrant: Secondary | ICD-10-CM

## 2011-01-30 ENCOUNTER — Ambulatory Visit (INDEPENDENT_AMBULATORY_CARE_PROVIDER_SITE_OTHER): Payer: Self-pay | Admitting: Surgery

## 2011-02-16 ENCOUNTER — Ambulatory Visit (INDEPENDENT_AMBULATORY_CARE_PROVIDER_SITE_OTHER): Payer: BC Managed Care – PPO | Admitting: Surgery

## 2011-02-16 ENCOUNTER — Encounter (HOSPITAL_COMMUNITY): Payer: Self-pay | Admitting: *Deleted

## 2011-02-16 ENCOUNTER — Encounter (INDEPENDENT_AMBULATORY_CARE_PROVIDER_SITE_OTHER): Payer: Self-pay | Admitting: Surgery

## 2011-02-16 VITALS — BP 120/76 | HR 72 | Temp 97.4°F | Resp 12 | Ht 65.0 in | Wt 150.4 lb

## 2011-02-16 DIAGNOSIS — IMO0001 Reserved for inherently not codable concepts without codable children: Secondary | ICD-10-CM

## 2011-02-16 DIAGNOSIS — Z5189 Encounter for other specified aftercare: Secondary | ICD-10-CM

## 2011-02-16 DIAGNOSIS — D249 Benign neoplasm of unspecified breast: Secondary | ICD-10-CM | POA: Insufficient documentation

## 2011-02-16 HISTORY — DX: Benign neoplasm of unspecified breast: D24.9

## 2011-02-16 HISTORY — DX: Reserved for inherently not codable concepts without codable children: IMO0001

## 2011-02-16 HISTORY — DX: Encounter for other specified aftercare: Z51.89

## 2011-02-16 NOTE — Patient Instructions (Signed)
We will schedule outpatient surgery to remove the lump (Papilloma) from your LEFT breast

## 2011-02-16 NOTE — Progress Notes (Signed)
Patient ID: Terry Bryant, female   DOB: [birth year], 36 y.o.   MRN: [id number]  Chief Complaint  Patient presents with  . Other    new pt- eval lt breast papilloma   Referred by Dr. Guinevere Ferrari  HPI Terry Bryant is a 36 y.o. female.  She found a left breast mass about a month ago. She then had a mammogram and ultrasound and a mass was noted in the 3:00 position left breast at the areolar margin. Biopsy showed an intraductal papilloma. We were asked to see her to evaluate her for possible excisional biopsy.  The patient is having no symptoms. She's had no prior breast problems. She has a negative family history for breast and ovarian cancer. HPI  Past Medical History  Diagnosis Date  . Hypertension     Past Surgical History  Procedure Date  . Tubal ligation   . Cesarean section     Family History  Problem Relation Age of Onset  . Heart disease Mother   . Heart disease Father     Social History History  Substance Use Topics  . Smoking status: Never Smoker   . Smokeless tobacco: Not on file  . Alcohol Use: Yes     rarely    Allergies  Allergen Reactions  . Penicillins     Current Outpatient Prescriptions  Medication Sig Dispense Refill  . NIFEdipine (PROCARDIA XL/ADALAT-CC) 30 MG 24 hr tablet Take 30 mg by mouth daily.          Review of Systems Review of Systems  Constitutional: Negative for fever, chills and unexpected weight change.  HENT: Negative for hearing loss, congestion, sore throat, trouble swallowing and voice change.   Eyes: Negative for visual disturbance.  Respiratory: Negative for cough and wheezing.   Cardiovascular: Negative for chest pain, palpitations and leg swelling.  Gastrointestinal: Negative for nausea, vomiting, abdominal pain, diarrhea, constipation, blood in stool, abdominal distention and anal bleeding.  Genitourinary: Negative for hematuria, vaginal bleeding and difficulty urinating.  Musculoskeletal: Negative for arthralgias.    Skin: Negative for rash and wound.  Neurological: Negative for seizures, syncope and headaches.  Hematological: Negative for adenopathy. Does not bruise/bleed easily.  Psychiatric/Behavioral: Negative for confusion.    Blood pressure 120/76, pulse 72, temperature 97.4 F (36.3 C), temperature source Temporal, resp. rate 12, height 5\' 5"  (1.651 m), weight 150 lb 6.4 oz (68.221 kg).  Physical Exam Physical Exam  Vitals reviewed. Constitutional: She is oriented to person, place, and time. She appears well-developed and well-nourished. No distress.  HENT:  Head: Normocephalic and atraumatic.  Mouth/Throat: Oropharynx is clear and moist.  Eyes: Conjunctivae and EOM are normal. Pupils are equal, round, and reactive to light. No scleral icterus.  Neck: Normal range of motion. Neck supple. No tracheal deviation present. No thyromegaly present.  Cardiovascular: Normal rate, regular rhythm, normal heart sounds and intact distal pulses.  Exam reveals no gallop and no friction rub.   No murmur heard. Pulmonary/Chest: Effort normal and breath sounds normal. No respiratory distress. She has no wheezes. She has no rales.    Abdominal: Soft. Bowel sounds are normal. She exhibits no distension and no mass. There is no tenderness. There is no rebound and no guarding.  Musculoskeletal: Normal range of motion. She exhibits no edema and no tenderness.  Neurological: She is alert and oriented to person, place, and time.  Skin: Skin is warm and dry. No rash noted. She is not diaphoretic. No erythema.  Psychiatric: She has a  normal mood and affect. Her behavior is normal. Judgment and thought content normal.  Breast mass per diagram. Lymphatics: No axillary or supraclavicular nodes  Data Reviewed I have reviewed her mammogram reports, ultrasound reports, biopsy reports, and pathology report.  Assessment    Left breast intraductal papilloma, 3 o'clock position    Plan    Excisional biopsy I have  discussed the indications for the lumpectomy and described the procedure. She understand that the chance of removal of the abnormal area is very good.We also discussed the possibility of a second procedure to get additional tissue if this is cancer. Risks of surgery such as bleeding and infection have also been explained, as well as the implications of not doing the surgery. She understands and wishes to proceed.        Nhu Glasby J 02/16/2011, 10:14 AM

## 2011-02-20 ENCOUNTER — Encounter (HOSPITAL_COMMUNITY): Payer: Self-pay | Admitting: Pharmacy Technician

## 2011-02-22 ENCOUNTER — Ambulatory Visit (HOSPITAL_COMMUNITY): Payer: BC Managed Care – PPO | Admitting: Anesthesiology

## 2011-02-22 ENCOUNTER — Encounter (HOSPITAL_COMMUNITY): Payer: Self-pay | Admitting: Anesthesiology

## 2011-02-22 ENCOUNTER — Encounter (HOSPITAL_COMMUNITY)
Admission: RE | Disposition: A | Discharge status: Home / Self Care | Payer: Self-pay | Source: Ambulatory Visit | Attending: Surgery

## 2011-02-22 ENCOUNTER — Encounter (HOSPITAL_COMMUNITY): Payer: Self-pay | Admitting: *Deleted

## 2011-02-22 ENCOUNTER — Ambulatory Visit (HOSPITAL_COMMUNITY)
Admission: RE | Admit: 2011-02-22 | Discharge: 2011-02-22 | Disposition: A | Discharge status: Home / Self Care | Payer: BC Managed Care – PPO | Source: Ambulatory Visit | Attending: Surgery | Admitting: Surgery

## 2011-02-22 ENCOUNTER — Other Ambulatory Visit (INDEPENDENT_AMBULATORY_CARE_PROVIDER_SITE_OTHER): Payer: Self-pay | Admitting: Surgery

## 2011-02-22 DIAGNOSIS — Z79899 Other long term (current) drug therapy: Secondary | ICD-10-CM | POA: Insufficient documentation

## 2011-02-22 DIAGNOSIS — I10 Essential (primary) hypertension: Secondary | ICD-10-CM | POA: Insufficient documentation

## 2011-02-22 DIAGNOSIS — D249 Benign neoplasm of unspecified breast: Secondary | ICD-10-CM

## 2011-02-22 HISTORY — PX: BREAST CYST EXCISION: SHX579

## 2011-02-22 HISTORY — DX: Encounter for other specified aftercare: Z51.89

## 2011-02-22 LAB — SURGICAL PCR SCREEN: MRSA, PCR: NEGATIVE

## 2011-02-22 SURGERY — EXCISION, CYST, BREAST
Anesthesia: General | Site: Breast | Laterality: Left | Wound class: Clean

## 2011-02-22 MED ORDER — ACETAMINOPHEN 10 MG/ML IV SOLN
INTRAVENOUS | Status: DC | PRN
  Administered 2011-02-22: 1000 mg via INTRAVENOUS

## 2011-02-22 MED ORDER — HYDROCODONE-ACETAMINOPHEN 5-325 MG PO TABS
ORAL_TABLET | ORAL | Status: AC
  Filled 2011-02-22: qty 1

## 2011-02-22 MED ORDER — BUPIVACAINE-EPINEPHRINE (PF) 0.5% -1:200000 IJ SOLN
INTRAMUSCULAR | Status: AC
  Filled 2011-02-22: qty 10

## 2011-02-22 MED ORDER — DEXAMETHASONE SODIUM PHOSPHATE 10 MG/ML IJ SOLN
INTRAMUSCULAR | Status: DC | PRN
  Administered 2011-02-22: 10 mg via INTRAVENOUS

## 2011-02-22 MED ORDER — ACETAMINOPHEN 10 MG/ML IV SOLN
INTRAVENOUS | Status: AC
  Filled 2011-02-22: qty 100

## 2011-02-22 MED ORDER — LIDOCAINE HCL 1 % IJ SOLN
INTRAMUSCULAR | Status: AC
  Filled 2011-02-22: qty 20

## 2011-02-22 MED ORDER — ONDANSETRON HCL 4 MG/2ML IJ SOLN
INTRAMUSCULAR | Status: DC | PRN
  Administered 2011-02-22: 4 mg via INTRAVENOUS

## 2011-02-22 MED ORDER — CIPROFLOXACIN IN D5W 400 MG/200ML IV SOLN
400.0000 mg | INTRAVENOUS | Status: AC
  Administered 2011-02-22: 400 mg via INTRAVENOUS

## 2011-02-22 MED ORDER — CHLORHEXIDINE GLUCONATE 4 % EX LIQD
1.0000 | Freq: Once | CUTANEOUS | Status: DC
Start: 2011-02-23 — End: 2011-02-22

## 2011-02-22 MED ORDER — HYDROCODONE-ACETAMINOPHEN 5-325 MG PO TABS: 1.0000 | ORAL_TABLET | Freq: Four times a day (QID) | ORAL | Status: AC | PRN

## 2011-02-22 MED ORDER — CHLORHEXIDINE GLUCONATE 4 % EX LIQD: 1.0000 "application " | Freq: Once | CUTANEOUS | Status: DC

## 2011-02-22 MED ORDER — LIDOCAINE HCL 1 % IJ SOLN
INTRAMUSCULAR | Status: DC | PRN
  Administered 2011-02-22: 50 mg via INTRADERMAL

## 2011-02-22 MED ORDER — BUPIVACAINE HCL (PF) 0.25 % IJ SOLN
INTRAMUSCULAR | Status: DC | PRN
  Administered 2011-02-22: 30 mL

## 2011-02-22 MED ORDER — LACTATED RINGERS IV SOLN: INTRAVENOUS | Status: DC

## 2011-02-22 MED ORDER — LACTATED RINGERS IV SOLN
INTRAVENOUS | Status: DC
  Administered 2011-02-22: 1000 mL via INTRAVENOUS

## 2011-02-22 MED ORDER — BUPIVACAINE HCL (PF) 0.25 % IJ SOLN
INTRAMUSCULAR | Status: AC
  Filled 2011-02-22: qty 30

## 2011-02-22 MED ORDER — LACTATED RINGERS IV SOLN
INTRAVENOUS | Status: DC | PRN
  Administered 2011-02-22 (×2): via INTRAVENOUS

## 2011-02-22 MED ORDER — PROMETHAZINE HCL 25 MG/ML IJ SOLN: 6.2500 mg | INTRAMUSCULAR | Status: DC | PRN

## 2011-02-22 MED ORDER — FENTANYL CITRATE 0.05 MG/ML IJ SOLN
INTRAMUSCULAR | Status: DC | PRN
  Administered 2011-02-22: 50 ug via INTRAVENOUS
  Administered 2011-02-22: 25 ug via INTRAVENOUS

## 2011-02-22 MED ORDER — CIPROFLOXACIN IN D5W 400 MG/200ML IV SOLN
INTRAVENOUS | Status: AC
  Filled 2011-02-22: qty 200

## 2011-02-22 MED ORDER — 0.9 % SODIUM CHLORIDE (POUR BTL) OPTIME
TOPICAL | Status: DC | PRN
  Administered 2011-02-22: 1000 mL

## 2011-02-22 MED ORDER — PROPOFOL 10 MG/ML IV EMUL
INTRAVENOUS | Status: DC | PRN
  Administered 2011-02-22: 200 mg via INTRAVENOUS

## 2011-02-22 MED ORDER — FENTANYL CITRATE 0.05 MG/ML IJ SOLN: 25.0000 ug | INTRAMUSCULAR | Status: DC | PRN

## 2011-02-22 MED ORDER — HYDROCODONE-ACETAMINOPHEN 5-325 MG PO TABS
1.0000 | ORAL_TABLET | ORAL | Status: DC | PRN
  Administered 2011-02-22: 1 via ORAL

## 2011-02-22 MED ORDER — MIDAZOLAM HCL 5 MG/5ML IJ SOLN
INTRAMUSCULAR | Status: DC | PRN
  Administered 2011-02-22: 2 mg via INTRAVENOUS

## 2011-02-22 MED ORDER — EPHEDRINE SULFATE 50 MG/ML IJ SOLN
INTRAMUSCULAR | Status: DC | PRN
  Administered 2011-02-22: 10 mg via INTRAVENOUS

## 2011-02-22 SURGICAL SUPPLY — 32 items
ADH SKN CLS APL DERMABOND .7 (GAUZE/BANDAGES/DRESSINGS) ×2
BINDER BREAST XLRG (GAUZE/BANDAGES/DRESSINGS) ×1 IMPLANT
BLADE HEX COATED 2.75 (ELECTRODE) ×2 IMPLANT
BLADE SURG 15 STRL LF DISP TIS (BLADE) ×1 IMPLANT
BLADE SURG 15 STRL SS (BLADE) ×2
CANISTER SUCTION 1200CC (MISCELLANEOUS) ×2 IMPLANT
CHLORAPREP W/TINT 26ML (MISCELLANEOUS) ×2 IMPLANT
CLOTH BEACON ORANGE TIMEOUT ST (SAFETY) ×1 IMPLANT
DECANTER SPIKE VIAL GLASS SM (MISCELLANEOUS) ×1 IMPLANT
DERMABOND ADVANCED (GAUZE/BANDAGES/DRESSINGS) ×2
DERMABOND ADVANCED .7 DNX12 (GAUZE/BANDAGES/DRESSINGS) ×1 IMPLANT
DRAPE LAPAROTOMY TRNSV 102X78 (DRAPE) ×2 IMPLANT
DRAPE UTILITY XL STRL (DRAPES) ×2 IMPLANT
ELECT REM PT RETURN 9FT ADLT (ELECTROSURGICAL) ×2
ELECTRODE REM PT RTRN 9FT ADLT (ELECTROSURGICAL) ×1 IMPLANT
GLOVE EUDERMIC 7 POWDERFREE (GLOVE) ×2 IMPLANT
GOWN PREVENTION PLUS XLARGE (GOWN DISPOSABLE) ×4 IMPLANT
NDL HYPO 25X1 1.5 SAFETY (NEEDLE) ×1 IMPLANT
NEEDLE HYPO 25X1 1.5 SAFETY (NEEDLE) ×2 IMPLANT
NS IRRIG 1000ML POUR BTL (IV SOLUTION) ×1 IMPLANT
PACK BASIC VI WITH GOWN DISP (CUSTOM PROCEDURE TRAY) ×2 IMPLANT
PENCIL BUTTON HOLSTER BLD 10FT (ELECTRODE) ×2 IMPLANT
SPONGE LAP 4X18 X RAY DECT (DISPOSABLE) ×2 IMPLANT
SUT MNCRL AB 4-0 PS2 18 (SUTURE) ×2 IMPLANT
SUT VIC AB 3-0 SH 18 (SUTURE) ×1 IMPLANT
SYR BULB IRRIGATION 50ML (SYRINGE) ×1 IMPLANT
SYR CONTROL 10ML LL (SYRINGE) ×2 IMPLANT
TOWEL OR 17X26 10 PK STRL BLUE (TOWEL DISPOSABLE) ×1 IMPLANT
TOWEL OR NON WOVEN STRL DISP B (DISPOSABLE) ×2 IMPLANT
WATER STERILE IRR 1000ML POUR (IV SOLUTION) ×2 IMPLANT
YANKAUER SUCT BULB TIP 10FT TU (MISCELLANEOUS) ×2 IMPLANT
YANKAUER SUCT BULB TIP NO VENT (SUCTIONS) ×2 IMPLANT

## 2011-02-22 NOTE — Transfer of Care (Signed)
Immediate Anesthesia Transfer of Care Note  Patient: Terry Bryant  Procedure(s) Performed:  CYST EXCISION BREAST - Removal Left Breast Mass  Patient Location: PACU  Anesthesia Type: General  Level of Consciousness: awake and alert   Airway & Oxygen Therapy: Patient Spontanous Breathing and Patient connected to face mask oxygen  Post-op Assessment: Report given to PACU RN and Post -op Vital signs reviewed and stable  Post vital signs: Reviewed and stable  Complications: No apparent anesthesia complications

## 2011-02-22 NOTE — Progress Notes (Signed)
Pt up in room and ambulated in hall to bathroom.  Pt tolerated well. Pt voided mod amount.

## 2011-02-22 NOTE — H&P (View-Only) (Signed)
Patient ID: Terry Bryant, female   DOB: 02/11/1976, 36 y.o.   MRN: [id number]  Chief Complaint  Patient presents with  . Other    new pt- eval lt breast papilloma   Referred by Dr. Brozzetti  HPI Terry Bryant is a 36 y.o. female.  She found a left breast mass about a month ago. She then had a mammogram and ultrasound and a mass was noted in the 3:00 position left breast at the areolar margin. Biopsy showed an intraductal papilloma. We were asked to see her to evaluate her for possible excisional biopsy.  The patient is having no symptoms. She's had no prior breast problems. She has a negative family history for breast and ovarian cancer. HPI  Past Medical History  Diagnosis Date  . Hypertension     Past Surgical History  Procedure Date  . Tubal ligation   . Cesarean section     Family History  Problem Relation Age of Onset  . Heart disease Mother   . Heart disease Father     Social History History  Substance Use Topics  . Smoking status: Never Smoker   . Smokeless tobacco: Not on file  . Alcohol Use: Yes     rarely    Allergies  Allergen Reactions  . Penicillins     Current Outpatient Prescriptions  Medication Sig Dispense Refill  . NIFEdipine (PROCARDIA XL/ADALAT-CC) 30 MG 24 hr tablet Take 30 mg by mouth daily.          Review of Systems Review of Systems  Constitutional: Negative for fever, chills and unexpected weight change.  HENT: Negative for hearing loss, congestion, sore throat, trouble swallowing and voice change.   Eyes: Negative for visual disturbance.  Respiratory: Negative for cough and wheezing.   Cardiovascular: Negative for chest pain, palpitations and leg swelling.  Gastrointestinal: Negative for nausea, vomiting, abdominal pain, diarrhea, constipation, blood in stool, abdominal distention and anal bleeding.  Genitourinary: Negative for hematuria, vaginal bleeding and difficulty urinating.  Musculoskeletal: Negative for arthralgias.    Skin: Negative for rash and wound.  Neurological: Negative for seizures, syncope and headaches.  Hematological: Negative for adenopathy. Does not bruise/bleed easily.  Psychiatric/Behavioral: Negative for confusion.    Blood pressure 120/76, pulse 72, temperature 97.4 F (36.3 C), temperature source Temporal, resp. rate 12, height 5' 5" (1.651 m), weight 150 lb 6.4 oz (68.221 kg).  Physical Exam Physical Exam  Vitals reviewed. Constitutional: She is oriented to person, place, and time. She appears well-developed and well-nourished. No distress.  HENT:  Head: Normocephalic and atraumatic.  Mouth/Throat: Oropharynx is clear and moist.  Eyes: Conjunctivae and EOM are normal. Pupils are equal, round, and reactive to light. No scleral icterus.  Neck: Normal range of motion. Neck supple. No tracheal deviation present. No thyromegaly present.  Cardiovascular: Normal rate, regular rhythm, normal heart sounds and intact distal pulses.  Exam reveals no gallop and no friction rub.   No murmur heard. Pulmonary/Chest: Effort normal and breath sounds normal. No respiratory distress. She has no wheezes. She has no rales.    Abdominal: Soft. Bowel sounds are normal. She exhibits no distension and no mass. There is no tenderness. There is no rebound and no guarding.  Musculoskeletal: Normal range of motion. She exhibits no edema and no tenderness.  Neurological: She is alert and oriented to person, place, and time.  Skin: Skin is warm and dry. No rash noted. She is not diaphoretic. No erythema.  Psychiatric: She has a   normal mood and affect. Her behavior is normal. Judgment and thought content normal.  Breast mass per diagram. Lymphatics: No axillary or supraclavicular nodes  Data Reviewed I have reviewed her mammogram reports, ultrasound reports, biopsy reports, and pathology report.  Assessment    Left breast intraductal papilloma, 3 o'clock position    Plan    Excisional biopsy I have  discussed the indications for the lumpectomy and described the procedure. She understand that the chance of removal of the abnormal area is very good.We also discussed the possibility of a second procedure to get additional tissue if this is cancer. Risks of surgery such as bleeding and infection have also been explained, as well as the implications of not doing the surgery. She understands and wishes to proceed.        Harshita Bernales J 02/16/2011, 10:14 AM    

## 2011-02-22 NOTE — Anesthesia Procedure Notes (Signed)
Procedure Name: LMA Insertion Date/Time: 02/22/2011 8:45 AM Performed by: [place], Zhoey Blackstock C Pre-anesthesia Checklist: Patient identified, Timeout performed, Emergency Drugs available, Suction available and Patient being monitored Patient Re-evaluated:Patient Re-evaluated prior to inductionOxygen Delivery Method: Circle System Utilized Preoxygenation: Pre-oxygenation with 100% oxygen Intubation Type: IV induction LMA: LMA inserted LMA Size: 4.0 Number of attempts: 1 Tube secured with: Tape Dental Injury: Teeth and Oropharynx as per pre-operative assessment

## 2011-02-22 NOTE — Anesthesia Preprocedure Evaluation (Signed)
Anesthesia Evaluation  Patient identified by MRN, date of birth, ID band Patient awake    Reviewed: Allergy & Precautions, H&P , NPO status , Patient's Chart, lab work & pertinent test results  Airway Mallampati: II TM Distance: >3 FB Neck ROM: full    Dental No notable dental hx. (+) Teeth Intact and Dental Advidsory Given   Pulmonary neg pulmonary ROS,  clear to auscultation  Pulmonary exam normal       Cardiovascular Exercise Tolerance: Good hypertension, On Medications neg cardio ROS regular Normal    Neuro/Psych Negative Neurological ROS  Negative Psych ROS   GI/Hepatic negative GI ROS, Neg liver ROS,   Endo/Other  Negative Endocrine ROS  Renal/GU negative Renal ROS  Genitourinary negative   Musculoskeletal   Abdominal Normal abdominal exam  (+)   Peds  Hematology negative hematology ROS (+)   Anesthesia Other Findings   Reproductive/Obstetrics negative OB ROS                           Anesthesia Physical Anesthesia Plan  ASA: II  Anesthesia Plan: General and General LMA   Post-op Pain Management:    Induction:   Airway Management Planned:   Additional Equipment:   Intra-op Plan:   Post-operative Plan:   Informed Consent: I have reviewed the patients History and Physical, chart, labs and discussed the procedure including the risks, benefits and alternatives for the proposed anesthesia with the patient or authorized representative who has indicated his/her understanding and acceptance.   Dental Advisory Given  Plan Discussed with: CRNA  Anesthesia Plan Comments:         Anesthesia Quick Evaluation

## 2011-02-22 NOTE — Op Note (Signed)
Mitzy MYSHA PEELER  Sep 21, 1975  [id number]  02/22/2011   Preoperative diagnosis: left breast mass probable papilloma  Postoperative diagnosis: the same  Procedure: removal of left breast mass  Surgeon: Currie Paris, MD, FACS  Anesthesia: General  Clinical History and Indications: this patient presented with a palpable left breast mass in the 3:00 position of the left breast near the areolar margin. A needle core biopsy had shown what appeared to be a papilloma and excision was recommended. The patient agreed to proceed.  Description of Procedure:I saw the patient in the holding area and reviewed the plans with her again. She had no further questions. The left breast was marked as the operative side.  The patient was taken to the operating room and identified with the patient's cooperation the exact location of the mass which was at the areolar margin and marked that. The patient then underwent satisfactory general anesthesia. The left breast was prepped and draped. A timeout was done.  I made a curvilinear incision at the areolar margin. The underlying breast tissue was quite dense. I elevated the skin flap medially towards the nipple and then laterally. The nodular density was palpable within this dense breast tissue. I grasped the area with an Allis clamp. Using the cautery I excised widely around the mass. I thought I had it completely out. Careful palpation of the remaining breast tissue showed an area of what I thought was dense fibrosis and a second 1 cm nodule thought might represent a fibroadenoma. These 2 areas were also excised. They were very deep.  For several minutes making sure everything was dry. I then injected 30 cc of 0.25% plain Marcaine to help with postop pain relief. The incision was closed with several layers of 3-0 Vicryl followed by 4-0 Monocryl subcuticular and Dermabond on the skin.    The patient tolerated the procedure well. There were no operative  complications. All counts were correct.   EBL: minimal  Currie Paris, MD, FACS 02/22/2011 9:26 AM

## 2011-02-22 NOTE — Interval H&P Note (Signed)
History and Physical Interval Note:  02/22/2011 8:29 AM  Terry Bryant  has presented today for surgery, with the diagnosis of papilloma left breast  The various methods of treatment have been discussed with the patient and family. After consideration of risks, benefits and other options for treatment, the patient has consented to  Procedure(s): CYST EXCISION BREAST as a surgical intervention .  The patients' history has been reviewed, patient examined, no change in status, stable for surgery.  I have reviewed the patients' chart and labs.  Questions were answered to the patient's satisfaction.     [name]

## 2011-02-22 NOTE — Anesthesia Postprocedure Evaluation (Signed)
Anesthesia Post Note  Patient: Terry Bryant  Procedure(s) Performed:  CYST EXCISION BREAST - Removal Left Breast Mass  Anesthesia type: General  Patient location: PACU  Post pain: Pain level controlled  Post assessment: Post-op Vital signs reviewed  Last Vitals:  Filed Vitals:   02/22/11 1015  BP: 117/72  Pulse: 68  Temp:   Resp: 15    Post vital signs: Reviewed  Level of consciousness: sedated  Complications: No apparent anesthesia complications

## 2011-02-23 MED FILL — Mupirocin Oint 2%: CUTANEOUS | Qty: 22 | Status: AC

## 2011-02-26 ENCOUNTER — Telehealth (INDEPENDENT_AMBULATORY_CARE_PROVIDER_SITE_OTHER): Payer: Self-pay | Admitting: General Surgery

## 2011-02-26 ENCOUNTER — Encounter (HOSPITAL_COMMUNITY): Payer: Self-pay | Admitting: Surgery

## 2011-02-26 NOTE — Telephone Encounter (Signed)
Patient made aware of path results. Will follow up at appt and call with any questions prior.  

## 2011-02-26 NOTE — Telephone Encounter (Signed)
Message copied by Liliana Cline on Mon Feb 26, 2011  9:13 AM ------      Message from: Currie Paris      Created: Fri Feb 23, 2011  4:33 PM       Tell her path is benign and as expected

## 2011-03-06 ENCOUNTER — Encounter (INDEPENDENT_AMBULATORY_CARE_PROVIDER_SITE_OTHER): Payer: BC Managed Care – PPO | Admitting: Surgery

## 2011-03-13 ENCOUNTER — Ambulatory Visit (INDEPENDENT_AMBULATORY_CARE_PROVIDER_SITE_OTHER): Payer: BC Managed Care – PPO | Admitting: Surgery

## 2011-03-13 ENCOUNTER — Encounter (INDEPENDENT_AMBULATORY_CARE_PROVIDER_SITE_OTHER): Payer: Self-pay | Admitting: Surgery

## 2011-03-13 VITALS — BP 140/86 | HR 70 | Temp 97.8°F | Resp 18 | Ht 65.0 in | Wt 153.8 lb

## 2011-03-13 DIAGNOSIS — D249 Benign neoplasm of unspecified breast: Secondary | ICD-10-CM

## 2011-03-13 NOTE — Progress Notes (Signed)
Terry Bryant    [id number] 03/13/2011    [birth year]   CC: Post op Excisonal left breast biopsy  HPI: The patient returns for post op follow-up. She underwent a excision of a left peri-areolar intraductal papilloma  on 1/0/10/13. Over all she feels that she is doing well. Mild burning pain at incision  PE: The incision is healing nicely and there is no evidence of infection or hematoma.  Marland Kitchen  DATA REVIEWED: Pathology report showed Intraductal papilloma and a nearby tubular adenoma  IMPRESSION: Patient doing well.   PLAN: RTC PRN. Gave her copy of path.

## 2011-03-13 NOTE — Patient Instructions (Signed)
We will see you again on an as needed basis. Please call the office at [phone] if you have any questions or concerns. Thank you for allowing us to take care of you.  

## 2011-06-01 ENCOUNTER — Telehealth: Payer: Self-pay | Admitting: Obstetrics and Gynecology

## 2011-06-01 NOTE — Telephone Encounter (Signed)
Routed to triage poole

## 2011-06-06 ENCOUNTER — Ambulatory Visit (INDEPENDENT_AMBULATORY_CARE_PROVIDER_SITE_OTHER): Payer: BC Managed Care – PPO | Admitting: Obstetrics and Gynecology

## 2011-06-06 ENCOUNTER — Ambulatory Visit (INDEPENDENT_AMBULATORY_CARE_PROVIDER_SITE_OTHER): Payer: BC Managed Care – PPO

## 2011-06-06 ENCOUNTER — Encounter: Payer: Self-pay | Admitting: Obstetrics and Gynecology

## 2011-06-06 VITALS — BP 118/64 | Wt 150.0 lb

## 2011-06-06 DIAGNOSIS — N926 Irregular menstruation, unspecified: Secondary | ICD-10-CM

## 2011-06-06 DIAGNOSIS — N80559 Endometriosis of other parts of the colon, unspecified depth: Secondary | ICD-10-CM | POA: Insufficient documentation

## 2011-06-06 DIAGNOSIS — N736 Female pelvic peritoneal adhesions (postinfective): Secondary | ICD-10-CM

## 2011-06-06 DIAGNOSIS — N805 Endometriosis of intestine: Secondary | ICD-10-CM

## 2011-06-06 HISTORY — DX: Female pelvic peritoneal adhesions (postinfective): N73.6

## 2011-06-06 LAB — US OB TRANSVAGINAL

## 2011-06-06 NOTE — Progress Notes (Signed)
S: 36 yo pt known for endometriosis and S/P LSO with trying to conceive since January 2012 (Mirena removed)     LMP: 04/30/11  5+2 weeks with EDD:02/04/12  Cycle was monthly since April 2012     Mild cramping on/off. No bleeding.   O: VS normal      Pelvic exam normal with no cervical motion tenderness. Uterus approx 10 weeks ( fibroids?)      Ultrasound today:  yolk sac seen, multiple fibroids (largest right post at 4 cm), S=D  A: IUP confirmed P: ultrasound in 2 weeks for viability      PNV

## 2011-06-19 ENCOUNTER — Encounter: Payer: Self-pay | Admitting: Obstetrics and Gynecology

## 2011-06-19 ENCOUNTER — Ambulatory Visit (INDEPENDENT_AMBULATORY_CARE_PROVIDER_SITE_OTHER): Payer: BC Managed Care – PPO

## 2011-06-19 ENCOUNTER — Ambulatory Visit (INDEPENDENT_AMBULATORY_CARE_PROVIDER_SITE_OTHER): Payer: BC Managed Care – PPO | Admitting: Obstetrics and Gynecology

## 2011-06-19 VITALS — BP 110/82 | Ht 64.5 in | Wt 156.0 lb

## 2011-06-19 DIAGNOSIS — O3680X0 Pregnancy with inconclusive fetal viability, not applicable or unspecified: Secondary | ICD-10-CM

## 2011-06-19 DIAGNOSIS — N926 Irregular menstruation, unspecified: Secondary | ICD-10-CM

## 2011-06-19 LAB — US OB TRANSVAGINAL

## 2011-06-19 NOTE — Progress Notes (Signed)
Pt has no complaints today.  Here for viability u/s.  Sono:  AUA: [redacted]w[redacted]d  EDC  02/03/12 FHR:  166 Single IUP. + FHT Yok sac and amnion seen. C/w LMP GA Multiple fibroids again noted. No change from 06/06/11 study.

## 2011-06-20 LAB — PRENATAL PANEL VII
Basophils Relative: 0 % (ref 0–1)
HCT: 32.5 % — ABNORMAL LOW (ref 36.0–46.0)
HIV: NONREACTIVE
Hemoglobin: 10 g/dL — ABNORMAL LOW (ref 12.0–15.0)
Hepatitis B Surface Ag: NEGATIVE
Lymphs Abs: 1.4 10*3/uL (ref 0.7–4.0)
MCHC: 30.8 g/dL (ref 30.0–36.0)
Monocytes Absolute: 0.5 10*3/uL (ref 0.1–1.0)
Monocytes Relative: 7 % (ref 3–12)
Neutro Abs: 4.2 10*3/uL (ref 1.7–7.7)
Neutrophils Relative %: 66 % (ref 43–77)
RBC: 4.18 MIL/uL (ref 3.87–5.11)
Rh Type: POSITIVE
Rubella: 215.4 IU/mL — ABNORMAL HIGH

## 2011-06-21 LAB — CULTURE, OB URINE

## 2011-07-02 ENCOUNTER — Encounter: Payer: BC Managed Care – PPO | Admitting: Obstetrics and Gynecology

## 2011-07-11 ENCOUNTER — Ambulatory Visit (INDEPENDENT_AMBULATORY_CARE_PROVIDER_SITE_OTHER): Payer: BC Managed Care – PPO | Admitting: Obstetrics and Gynecology

## 2011-07-11 DIAGNOSIS — Z331 Pregnant state, incidental: Secondary | ICD-10-CM

## 2011-07-17 ENCOUNTER — Encounter: Payer: Self-pay | Admitting: Obstetrics and Gynecology

## 2011-07-17 ENCOUNTER — Ambulatory Visit (INDEPENDENT_AMBULATORY_CARE_PROVIDER_SITE_OTHER): Payer: BC Managed Care – PPO | Admitting: Obstetrics and Gynecology

## 2011-07-17 ENCOUNTER — Ambulatory Visit (INDEPENDENT_AMBULATORY_CARE_PROVIDER_SITE_OTHER): Payer: BC Managed Care – PPO

## 2011-07-17 VITALS — BP 108/72 | Wt 159.0 lb

## 2011-07-17 DIAGNOSIS — Z2089 Contact with and (suspected) exposure to other communicable diseases: Secondary | ICD-10-CM

## 2011-07-17 DIAGNOSIS — Z98891 History of uterine scar from previous surgery: Secondary | ICD-10-CM

## 2011-07-17 DIAGNOSIS — Z202 Contact with and (suspected) exposure to infections with a predominantly sexual mode of transmission: Secondary | ICD-10-CM

## 2011-07-17 DIAGNOSIS — O3680X0 Pregnancy with inconclusive fetal viability, not applicable or unspecified: Secondary | ICD-10-CM

## 2011-07-17 DIAGNOSIS — Z9889 Other specified postprocedural states: Secondary | ICD-10-CM

## 2011-07-17 DIAGNOSIS — Z331 Pregnant state, incidental: Secondary | ICD-10-CM

## 2011-07-17 HISTORY — DX: History of uterine scar from previous surgery: Z98.891

## 2011-07-17 LAB — POCT WET PREP (WET MOUNT): Clue Cells Wet Prep Whiff POC: POSITIVE

## 2011-07-17 MED ORDER — METRONIDAZOLE 500 MG PO TABS: 500.0000 mg | ORAL_TABLET | Freq: Two times a day (BID) | ORAL | Status: AC

## 2011-07-17 NOTE — Progress Notes (Signed)
Pt here for new ob visit History reviewed Pt without complaints Physical Examination: General appearance - alert, well appearing, and in no distress Mental status - normal mood, behavior, speech, dress, motor activity, and thought processes Neck - supple, no significant adenopathy, thyroid exam: thyroid is normal in size without nodules or tenderness Chest - clear to auscultation, no wheezes, rales or rhonchi, symmetric air entry Heart - normal rate and regular rhythm Abdomen - soft, nontender, nondistended, no masses or organomegaly Breasts - breasts appear normal, no suspicious masses, no skin or nipple changes or axillary nodes Pelvic - normal external genitalia, vulva, vagina, cervix, uterus and adnexa.  12 week size Rectal - normal rectal, no masses, rectal exam not indicated Back exam - full range of motion, no tenderness, palpable spasm or pain on motion Neurological - alert, oriented, normal speech, no focal findings or movement disorder noted Musculoskeletal - no joint tenderness, deformity or swelling Extremities - no edema, redness or tenderness in the calves or thighs Skin - normal coloration and turgor, no rashes, no suspicious skin lesions noted New OB exam BV will treat with flagyl H/o cesarean section need to review op note to determine if pt a candidate for VBAC Pap sent no Mammogram due no declined genetic testing. US done to establish viability I was unable to hear FHTS RT 4 weeks

## 2011-07-17 NOTE — Patient Instructions (Signed)
ABCs of Pregnancy A Antepartum care is very important. Be sure you see your doctor and get prenatal care as soon as you think you are pregnant. At this time, you will be tested for infection, genetic abnormalities and potential problems with you and the pregnancy. This is the time to discuss diet, exercise, work, medications, labor, pain medication during labor and the possibility of a cesarean delivery. Ask any questions that may concern you. It is important to see your doctor regularly throughout your pregnancy. Avoid exposure to toxic substances and chemicals - such as cleaning solvents, lead and mercury, some insecticides, and paint. Pregnant women should avoid exposure to paint fumes, and fumes that cause you to feel ill, dizzy or faint. When possible, it is a good idea to have a pre-pregnancy consultation with your caregiver to begin some important recommendations your caregiver suggests such as, taking folic acid, exercising, quitting smoking, avoiding alcoholic beverages, etc. B Breastfeeding is the healthiest choice for both you and your baby. It has many nutritional benefits for the baby and health benefits for the mother. It also creates a very tight and loving bond between the baby and mother. Talk to your doctor, your family and friends, and your employer about how you choose to feed your baby and how they can support you in your decision. Not all birth defects can be prevented, but a woman can take actions that may increase her chance of having a healthy baby. Many birth defects happen very early in pregnancy, sometimes before a woman even knows she is pregnant. Birth defects or abnormalities of any child in your or the father's family should be discussed with your caregiver. Get a good support bra as your breast size changes. Wear it especially when you exercise and when nursing.  C Celebrate the news of your pregnancy with the your spouse/father and family. Childbirth classes are helpful to  take for you and the spouse/father because it helps to understand what happens during the pregnancy, labor and delivery. Cesarean delivery should be discussed with your doctor so you are prepared for that possibility. The pros and cons of circumcision if it is a boy, should be discussed with your pediatrician. Cigarette smoking during pregnancy can result in low birth weight babies. It has been associated with infertility, miscarriages, tubal pregnancies, infant death (mortality) and poor health (morbidity) in childhood. Additionally, cigarette smoking may cause long-term learning disabilities. If you smoke, you should try to quit before getting pregnant and not smoke during the pregnancy. Secondary smoke may also harm a mother and her developing baby. It is a good idea to ask people to stop smoking around you during your pregnancy and after the baby is born. Extra calcium is necessary when you are pregnant and is found in your prenatal vitamin, in dairy products, green leafy vegetables and in calcium supplements. D A healthy diet according to your current weight and height, along with vitamins and mineral supplements should be discussed with your caregiver. Domestic abuse or violence should be made known to your doctor right away to get the situation corrected. Drink more water when you exercise to keep hydrated. Discomfort of your back and legs usually develops and progresses from the middle of the second trimester through to delivery of the baby. This is because of the enlarging baby and uterus, which may also affect your balance. Do not take illegal drugs. Illegal drugs can seriously harm the baby and you. Drink extra fluids (water is best) throughout pregnancy to help   your body keep up with the increases in your blood volume. Drink at least 6 to 8 glasses of water, fruit juice, or milk each day. A good way to know you are drinking enough fluid is when your urine looks almost like clear water or is very light  yellow.  E Eat healthy to get the nutrients you and your unborn baby need. Your meals should include the five basic food groups. Exercise (30 minutes of light to moderate exercise a day) is important and encouraged during pregnancy, if there are no medical problems or problems with the pregnancy. Exercise that causes discomfort or dizziness should be stopped and reported to your caregiver. Emotions during pregnancy can change from being ecstatic to depression and should be understood by you, your partner and your family. F Fetal screening with ultrasound, amniocentesis and monitoring during pregnancy and labor is common and sometimes necessary. Take 400 micrograms of folic acid daily both before, when possible, and during the first few months of pregnancy to reduce the risk of birth defects of the brain and spine. All women who could possibly become pregnant should take a vitamin with folic acid, every day. It is also important to eat a healthy diet with fortified foods (enriched grain products, including cereals, rice, breads, and pastas) and foods with natural sources of folate (orange juice, green leafy vegetables, beans, peanuts, broccoli, asparagus, peas, and lentils). The father should be involved with all aspects of the pregnancy including, the prenatal care, childbirth classes, labor, delivery, and postpartum time. Fathers may also have emotional concerns about being a father, financial needs, and raising a family. G Genetic testing should be done appropriately. It is important to know your family and the father's history. If there have been problems with pregnancies or birth defects in your family, report these to your doctor. Also, genetic counselors can talk with you about the information you might need in making decisions about having a family. You can call a major medical center in your area for help in finding a board-certified genetic counselor. Genetic testing and counseling should be done  before pregnancy when possible, especially if there is a history of problems in the mother's or father's family. Certain ethnic backgrounds are more at risk for genetic defects. H Get familiar with the hospital where you will be having your baby. Get to know how long it takes to get there, the labor and delivery area, and the hospital procedures. Be sure your medical insurance is accepted there. Get your home ready for the baby including, clothes, the baby's room (when possible), furniture and car seat. Hand washing is important throughout the day, especially after handling raw meat and poultry, changing the baby's diaper or using the bathroom. This can help prevent the spread of many bacteria and viruses that cause infection. Your hair may become dry and thinner, but will return to normal a few weeks after the baby is born. Heartburn is a common problem that can be treated by taking antacids recommended by your caregiver, eating smaller meals 5 or 6 times a day, not drinking liquids when eating, drinking between meals and raising the head of your bed 2 to 3 inches. I Insurance to cover you, the baby, doctor and hospital should be reviewed so that you will be prepared to pay any costs not covered by your insurance plan. If you do not have medical insurance, there are usually clinics and services available for you in your community. Take 30 milligrams of iron during   your pregnancy as prescribed by your doctor to reduce the risk of low red blood cells (anemia) later in pregnancy. All women of childbearing age should eat a diet rich in iron. J There should be a joint effort for the mother, father and any other children to adapt to the pregnancy financially, emotionally, and psychologically during the pregnancy. Join a support group for moms-to-be. Or, join a class on parenting or childbirth. Have the family participate when possible. K Know your limits. Let your caregiver know if you experience any of the  following:   Pain of any kind.   Strong cramps.   You develop a lot of weight in a short period of time (5 pounds in 3 to 5 days).   Vaginal bleeding, leaking of amniotic fluid.   Headache, vision problems.   Dizziness, fainting, shortness of breath.   Chest pain.   Fever of 102 F (38.9 C) or higher.   Gush of clear fluid from your vagina.   Painful urination.   Domestic violence.   Irregular heartbeat (palpitations).   Rapid beating of the heart (tachycardia).   Constant feeling sick to your stomach (nauseous) and vomiting.   Trouble walking, fluid retention (edema).   Muscle weakness.   If your baby has decreased activity.   Persistent diarrhea.   Abnormal vaginal discharge.   Uterine contractions at 20-minute intervals.   Back pain that travels down your leg.  L Learn and practice that what you eat and drink should be in moderation and healthy for you and your baby. Legal drugs such as alcohol and caffeine are important issues for pregnant women. There is no safe amount of alcohol a woman can drink while pregnant. Fetal alcohol syndrome, a disorder characterized by growth retardation, facial abnormalities, and central nervous system dysfunction, is caused by a woman's use of alcohol during pregnancy. Caffeine, found in tea, coffee, soft drinks and chocolate, should also be limited. Be sure to read labels when trying to cut down on caffeine during pregnancy. More than 200 foods, beverages, and over-the-counter medications contain caffeine and have a high salt content! There are coffees and teas that do not contain caffeine. M Medical conditions such as diabetes, epilepsy, and high blood pressure should be treated and kept under control before pregnancy when possible, but especially during pregnancy. Ask your caregiver about any medications that may need to be changed or adjusted during pregnancy. If you are currently taking any medications, ask your caregiver if it  is safe to take them while you are pregnant or before getting pregnant when possible. Also, be sure to discuss any herbs or vitamins you are taking. They are medicines, too! Discuss with your doctor all medications, prescribed and over-the-counter, that you are taking. During your prenatal visit, discuss the medications your doctor may give you during labor and delivery. N Never be afraid to ask your doctor or caregiver questions about your health, the progress of the pregnancy, family problems, stressful situations, and recommendation for a pediatrician, if you do not have one. It is better to take all precautions and discuss any questions or concerns you may have during your office visits. It is a good idea to write down your questions before you visit the doctor. O Over-the-counter cough and cold remedies may contain alcohol or other ingredients that should be avoided during pregnancy. Ask your caregiver about prescription, herbs or over-the-counter medications that you are taking or may consider taking while pregnant.  P Physical activity during pregnancy can   benefit both you and your baby by lessening discomfort and fatigue, providing a sense of well-being, and increasing the likelihood of early recovery after delivery. Light to moderate exercise during pregnancy strengthens the belly (abdominal) and back muscles. This helps improve posture. Practicing yoga, walking, swimming, and cycling on a stationary bicycle are usually safe exercises for pregnant women. Avoid scuba diving, exercise at high altitudes (over 3000 feet), skiing, horseback riding, contact sports, etc. Always check with your doctor before beginning any kind of exercise, especially during pregnancy and especially if you did not exercise before getting pregnant. Q Queasiness, stomach upset and morning sickness are common during pregnancy. Eating a couple of crackers or dry toast before getting out of bed. Foods that you normally love may  make you feel sick to your stomach. You may need to substitute other nutritious foods. Eating 5 or 6 small meals a day instead of 3 large ones may make you feel better. Do not drink with your meals, drink between meals. Questions that you have should be written down and asked during your prenatal visits. R Read about and make plans to baby-proof your home. There are important tips for making your home a safer environment for your baby. Review the tips and make your home safer for you and your baby. Read food labels regarding calories, salt and fat content in the food. S Saunas, hot tubs, and steam rooms should be avoided while you are pregnant. Excessive high heat may be harmful during your pregnancy. Your caregiver will screen and examine you for sexually transmitted diseases and genetic disorders during your prenatal visits. Learn the signs of labor. Sexual relations while pregnant is safe unless there is a medical or pregnancy problem and your caregiver advises against it. T Traveling long distances should be avoided especially in the third trimester of your pregnancy. If you do have to travel out of state, be sure to take a copy of your medical records and medical insurance plan with you. You should not travel long distances without seeing your doctor first. Most airlines will not allow you to travel after 36 weeks of pregnancy. Toxoplasmosis is an infection caused by a parasite that can seriously harm an unborn baby. Avoid eating undercooked meat and handling cat litter. Be sure to wear gloves when gardening. Tingling of the hands and fingers is not unusual and is due to fluid retention. This will go away after the baby is born. U Womb (uterus) size increases during the first trimester. Your kidneys will begin to function more efficiently. This may cause you to feel the need to urinate more often. You may also leak urine when sneezing, coughing or laughing. This is due to the growing uterus pressing  against your bladder, which lies directly in front of and slightly under the uterus during the first few months of pregnancy. If you experience burning along with frequency of urination or bloody urine, be sure to tell your doctor. The size of your uterus in the third trimester may cause a problem with your balance. It is advisable to maintain good posture and avoid wearing high heels during this time. An ultrasound of your baby may be necessary during your pregnancy and is safe for you and your baby. V Vaccinations are an important concern for pregnant women. Get needed vaccines before pregnancy. Center for Disease Control (www.cdc.gov) has clear guidelines for the use of vaccines during pregnancy. Review the list, be sure to discuss it with your doctor. Prenatal vitamins are helpful   and healthy for you and the baby. Do not take extra vitamins except what is recommended. Taking too much of certain vitamins can cause overdose problems. Continuous vomiting should be reported to your caregiver. Varicose veins may appear especially if there is a family history of varicose veins. They should subside after the delivery of the baby. Support hose helps if there is leg discomfort. W Being overweight or underweight during pregnancy may cause problems. Try to get within 15 pounds of your ideal weight before pregnancy. Remember, pregnancy is not a time to be dieting! Do not stop eating or start skipping meals as your weight increases. Both you and your baby need the calories and nutrition you receive from a healthy diet. Be sure to consult with your doctor about your diet. There is a formula and diet plan available depending on whether you are overweight or underweight. Your caregiver or nutritionist can help and advise you if necessary. X Avoid X-rays. If you must have dental work or diagnostic tests, tell your dentist or physician that you are pregnant so that extra care can be taken. X-rays should only be taken when  the risks of not taking them outweigh the risk of taking them. If needed, only the minimum amount of radiation should be used. When X-rays are necessary, protective lead shields should be used to cover areas of the body that are not being X-rayed. Y Your baby loves you. Breastfeeding your baby creates a loving and very close bond between the two of you. Give your baby a healthy environment to live in while you are pregnant. Infants and children require constant care and guidance. Their health and safety should be carefully watched at all times. After the baby is born, rest or take a nap when the baby is sleeping. Z Get your ZZZs. Be sure to get plenty of rest. Resting on your side as often as possible, especially on your left side is advised. It provides the best circulation to your baby and helps reduce swelling. Try taking a nap for 30 to 45 minutes in the afternoon when possible. After the baby is born rest or take a nap when the baby is sleeping. Try elevating your feet for that amount of time when possible. It helps the circulation in your legs and helps reduce swelling.  Most information courtesy of the CDC. Document Released: 01/29/2005 Document Revised: 01/18/2011 Document Reviewed: 10/13/2008 [employer] Patient Information 2012 [employer]. 

## 2011-07-17 NOTE — Progress Notes (Signed)
Pt without complaints Declines genetic screening Last pap: Oct 2012/WNL

## 2011-07-18 LAB — US OB LIMITED

## 2011-08-02 ENCOUNTER — Telehealth: Payer: Self-pay | Admitting: Obstetrics and Gynecology

## 2011-08-02 NOTE — Telephone Encounter (Signed)
Triage/cht received 

## 2011-08-03 ENCOUNTER — Encounter: Payer: Self-pay | Admitting: Obstetrics and Gynecology

## 2011-08-03 NOTE — Telephone Encounter (Signed)
Spoke with pt rgd msg pt c/o rash on abdomen no change in soap or detergent no fever no hot to touch just very itchy consult with [facility] per [facility] pt to take zyrtec,claratin, use cold compress ot ice pack to prevent itching appt 08/06/11 at 3:15 for eval if rash spreads or get worse call office pt wants [facility] letter email to her at [email or web address] advised pt will email pt voice understanding

## 2011-08-04 ENCOUNTER — Telehealth: Payer: Self-pay | Admitting: Obstetrics and Gynecology

## 2011-08-04 NOTE — Telephone Encounter (Signed)
Discussed round ligament pain, common discomforts of pg, comfort measures ice, tylenol, motrin to 28 weeks, change position slower, 8 water daily, frequent voids. Report if unresolved has f/o on Monday. Lavera Guise, CNM

## 2011-08-06 ENCOUNTER — Ambulatory Visit (INDEPENDENT_AMBULATORY_CARE_PROVIDER_SITE_OTHER): Payer: BC Managed Care – PPO

## 2011-08-06 VITALS — BP 116/58 | Wt 163.0 lb

## 2011-08-06 DIAGNOSIS — Z331 Pregnant state, incidental: Secondary | ICD-10-CM

## 2011-08-06 DIAGNOSIS — D649 Anemia, unspecified: Secondary | ICD-10-CM | POA: Insufficient documentation

## 2011-08-06 NOTE — Progress Notes (Signed)
w-i to eval "rash" on abdomen.  Called office r/e small rash above umbilicus on Friday.  Using Kenalog topically since.  Inspection-very small papular rash within striae just above umbilicus in area of about 2-3 cm.  Rash confined to area and disc'd w/ pt likely r/t to skin stretching again this pregnancy along lines of striae.  Doesn't appear to be PUPPS.  Good relief from comfort measures disc'd.  Pt to CTO and refer to Derm prn. Pt's skin is loose and scarring from previous sx's noted.  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 Will cx appt 7/5 and RTO in 3 weeks, and then visit after that (2-3 wks) for anatomy, or f/u prn. Disc'd anemia and iron rich food sheet given; also rec'd Floridex 2 tsp po bid.

## 2011-08-06 NOTE — Progress Notes (Signed)
No complaints

## 2011-08-17 ENCOUNTER — Encounter: Payer: BC Managed Care – PPO | Admitting: Obstetrics and Gynecology

## 2011-08-30 ENCOUNTER — Encounter: Payer: BC Managed Care – PPO | Admitting: Obstetrics and Gynecology

## 2011-08-31 ENCOUNTER — Ambulatory Visit (INDEPENDENT_AMBULATORY_CARE_PROVIDER_SITE_OTHER): Payer: BC Managed Care – PPO | Admitting: Obstetrics and Gynecology

## 2011-08-31 ENCOUNTER — Encounter: Payer: Self-pay | Admitting: Obstetrics and Gynecology

## 2011-08-31 VITALS — BP 100/66 | Wt 161.0 lb

## 2011-08-31 DIAGNOSIS — Z3689 Encounter for other specified antenatal screening: Secondary | ICD-10-CM

## 2011-08-31 DIAGNOSIS — J301 Allergic rhinitis due to pollen: Secondary | ICD-10-CM

## 2011-08-31 DIAGNOSIS — Z9109 Other allergy status, other than to drugs and biological substances: Secondary | ICD-10-CM

## 2011-08-31 MED ORDER — LORATADINE 10 MG PO TABS: 10.0000 mg | ORAL_TABLET | Freq: Every day | ORAL | Status: DC

## 2011-08-31 NOTE — Progress Notes (Signed)
No concerns per pt 

## 2011-08-31 NOTE — Progress Notes (Signed)
[redacted]w[redacted]d C/o slight breathlessness, no hx of asthma but history of allergies in Summer. To tx Claritin, also explained that slight breathlessness is common in pregnancy. Poor Dentation, poor hygiene ROB x 2 weeks for anatomy USS.

## 2011-08-31 NOTE — Addendum Note (Signed)
Addended by: Earl Gala on: 08/31/2011 05:32 PM   Modules accepted: Orders

## 2011-09-05 ENCOUNTER — Inpatient Hospital Stay (HOSPITAL_COMMUNITY)
Admission: AD | Admit: 2011-09-05 | Discharge: 2011-09-06 | Disposition: A | Discharge status: Home / Self Care | Payer: BC Managed Care – PPO | Source: Ambulatory Visit | Attending: Obstetrics and Gynecology | Admitting: Obstetrics and Gynecology

## 2011-09-05 ENCOUNTER — Encounter: Payer: Self-pay | Admitting: Obstetrics and Gynecology

## 2011-09-05 ENCOUNTER — Telehealth: Payer: Self-pay | Admitting: Obstetrics and Gynecology

## 2011-09-05 ENCOUNTER — Ambulatory Visit (INDEPENDENT_AMBULATORY_CARE_PROVIDER_SITE_OTHER): Payer: BC Managed Care – PPO | Admitting: Obstetrics and Gynecology

## 2011-09-05 ENCOUNTER — Encounter (HOSPITAL_COMMUNITY): Payer: Self-pay | Admitting: *Deleted

## 2011-09-05 VITALS — BP 124/70 | Wt 160.0 lb

## 2011-09-05 DIAGNOSIS — N760 Acute vaginitis: Secondary | ICD-10-CM

## 2011-09-05 DIAGNOSIS — I10 Essential (primary) hypertension: Secondary | ICD-10-CM

## 2011-09-05 DIAGNOSIS — D249 Benign neoplasm of unspecified breast: Secondary | ICD-10-CM | POA: Diagnosis not present

## 2011-09-05 DIAGNOSIS — B9689 Other specified bacterial agents as the cause of diseases classified elsewhere: Secondary | ICD-10-CM

## 2011-09-05 DIAGNOSIS — O10019 Pre-existing essential hypertension complicating pregnancy, unspecified trimester: Secondary | ICD-10-CM | POA: Insufficient documentation

## 2011-09-05 DIAGNOSIS — O3410 Maternal care for benign tumor of corpus uteri, unspecified trimester: Secondary | ICD-10-CM | POA: Insufficient documentation

## 2011-09-05 DIAGNOSIS — Z331 Pregnant state, incidental: Secondary | ICD-10-CM

## 2011-09-05 DIAGNOSIS — D259 Leiomyoma of uterus, unspecified: Secondary | ICD-10-CM | POA: Diagnosis present

## 2011-09-05 DIAGNOSIS — O200 Threatened abortion: Secondary | ICD-10-CM | POA: Insufficient documentation

## 2011-09-05 HISTORY — DX: Essential (primary) hypertension: I10

## 2011-09-05 HISTORY — DX: Leiomyoma of uterus, unspecified: D25.9

## 2011-09-05 NOTE — MAU Note (Signed)
Pt G2 P1 at 18.2wks, having contractions today.  Pt was seen in the office today and was instructed to increase fluid intake and take motrin-no relief.

## 2011-09-05 NOTE — Progress Notes (Signed)
Pt c/o of severe cramping, ? Contractions. Pain started last night at 7 p.m comes and goes. History of PTL with last pregnancy.

## 2011-09-05 NOTE — Progress Notes (Signed)
Complains of severe cramping.  Normal GI and GU function.  Cervix closed and long.  Fundus is tender.  Take ibuprofen 600 mg every 6 hours as needed. Ultrasound scheduled for Monday. Preterm labor discussed. Urine culture. Declines genetic screening. Dr. Stefano Gaul

## 2011-09-05 NOTE — Telephone Encounter (Signed)
TC from pt at 18wks c/o severe pelvic cramping/ctx. Denies LOF, VB, or DC. Was seen in office today cervix closed, pt has taken 600mg  motrin without relief, instructed to come to MAU

## 2011-09-05 NOTE — Telephone Encounter (Signed)
Tc to pt regarding msg, lm on vm to call back. 

## 2011-09-05 NOTE — Telephone Encounter (Signed)
TC from pt.  States has been having cramping that comes and goes since last PM.   Has tried increased water, Ibuprofen, rest and pain is increasing.  Has hx PTL at 30 wks.  Per DD to office for eval.

## 2011-09-06 DIAGNOSIS — O3410 Maternal care for benign tumor of corpus uteri, unspecified trimester: Secondary | ICD-10-CM

## 2011-09-06 DIAGNOSIS — O4700 False labor before 37 completed weeks of gestation, unspecified trimester: Secondary | ICD-10-CM

## 2011-09-06 LAB — GC/CHLAMYDIA PROBE AMP, GENITAL: Chlamydia, DNA Probe: NEGATIVE

## 2011-09-06 LAB — URINALYSIS, ROUTINE W REFLEX MICROSCOPIC
Bilirubin Urine: NEGATIVE
Glucose, UA: NEGATIVE mg/dL
Ketones, ur: NEGATIVE mg/dL
Nitrite: NEGATIVE
Specific Gravity, Urine: 1.01 (ref 1.005–1.030)
pH: 5.5 (ref 5.0–8.0)

## 2011-09-06 LAB — WET PREP, GENITAL: Trich, Wet Prep: NONE SEEN

## 2011-09-06 MED ORDER — LACTATED RINGERS IV BOLUS (SEPSIS)
500.0000 mL | Freq: Once | INTRAVENOUS | Status: AC
  Administered 2011-09-06: 500 mL via INTRAVENOUS

## 2011-09-06 MED ORDER — KETOROLAC TROMETHAMINE 30 MG/ML IJ SOLN
30.0000 mg | Freq: Once | INTRAMUSCULAR | Status: AC
  Administered 2011-09-06: 30 mg via INTRAVENOUS
  Filled 2011-09-06: qty 1

## 2011-09-06 MED ORDER — HYDROCODONE-ACETAMINOPHEN 5-325 MG PO TABS
1.0000 | ORAL_TABLET | Freq: Once | ORAL | Status: AC
  Administered 2011-09-06: 1 via ORAL
  Filled 2011-09-06: qty 1

## 2011-09-06 MED ORDER — IBUPROFEN 200 MG PO TABS: 600.0000 mg | ORAL_TABLET | Freq: Four times a day (QID) | ORAL | Status: DC

## 2011-09-06 MED ORDER — METRONIDAZOLE 500 MG PO TABS: 500.0000 mg | ORAL_TABLET | Freq: Two times a day (BID) | ORAL | Status: DC

## 2011-09-06 MED ORDER — HYDROCODONE-ACETAMINOPHEN 5-325 MG PO TABS: 1.0000 | ORAL_TABLET | Freq: Four times a day (QID) | ORAL | Status: DC | PRN

## 2011-09-06 MED ORDER — PROGESTERONE MICRONIZED 200 MG PO CAPS
200.0000 mg | ORAL_CAPSULE | Freq: Once | ORAL | Status: AC
  Administered 2011-09-06: 200 mg via VAGINAL
  Filled 2011-09-06: qty 1

## 2011-09-06 MED ORDER — METRONIDAZOLE 500 MG PO TABS
500.0000 mg | ORAL_TABLET | Freq: Once | ORAL | Status: AC
  Administered 2011-09-06: 500 mg via ORAL
  Filled 2011-09-06: qty 1

## 2011-09-06 MED ORDER — PANTOPRAZOLE SODIUM 40 MG PO TBEC: 40.0000 mg | DELAYED_RELEASE_TABLET | Freq: Every day | ORAL | Status: DC

## 2011-09-06 MED ORDER — LACTATED RINGERS IV SOLN
INTRAVENOUS | Status: DC
  Administered 2011-09-06: 01:00:00 via INTRAVENOUS

## 2011-09-06 MED ORDER — PROGESTERONE MICRONIZED 200 MG PO CAPS: ORAL_CAPSULE | ORAL | Status: DC

## 2011-09-06 MED ORDER — KETOROLAC TROMETHAMINE 60 MG/2ML IM SOLN
30.0000 mg | Freq: Once | INTRAMUSCULAR | Status: AC
  Administered 2011-09-06: 30 mg via INTRAMUSCULAR
  Filled 2011-09-06: qty 2

## 2011-09-06 NOTE — MAU Provider Note (Signed)
History     CSN: [id number]  Arrival date and time: 09/05/11 2247   None     Chief Complaint  Patient presents with  . Contractions   HPI Comments: Pt is a G2P1 at [redacted]w[redacted]d w c/o ctx/cramping that have been going on all day. SHe as seen in the office by Dr Stefano Gaul today and cervix was closed, she has been taking motrin 600mg  PO, last dose at 1930. Denies any VB, LOF, D/C, feels some flutters.       Past Medical History  Diagnosis Date  . Hypertension   . 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 Endometriosis of colon 2009  . Pelvic adhesions   . Pneumonia AGE 72  . Blood transfusion 02-16-11    '05 with ovary surgery  . Preterm labor 2004  . Infection     OCC  . Infection AGE 25    CHLAMYDIA  . Anemia     TAKES FE OCC  . Fibroids 2004  . Hypertension 2007    DR [facility]   ON MED  . Eczema     Past Surgical History  Procedure Date  . Cesarean section   . Breast cyst excision 02/22/2011    Procedure: CYST EXCISION BREAST;  Surgeon: Currie Paris, MD;  Location: [facility];  Service: General;  Laterality: Left;  Removal Left Breast Mass  . Salpyngooophorectomy     LSO 2005 for TOA    Family History  Problem Relation Age of Onset  . Heart disease Mother   . Diabetes Father   . Heart disease Father     DIED AT 77  . Hypertension Maternal Grandmother   . Stroke Maternal Grandmother   . Heart disease Maternal Grandfather   . Diabetes Maternal Grandfather   . Lupus Sister   . Asthma Daughter     History  Substance Use Topics  . Smoking status: Never Smoker   . Smokeless tobacco: Never Used  . Alcohol Use: No     rarely  NONE SINCE PREGNANT    Allergies:  Allergies  Allergen Reactions  . Bee Venom   . Penicillins Swelling    A problem from birth -unknown reaction    Prescriptions prior to admission  Medication Sig Dispense Refill  . acetaminophen (TYLENOL) 325 MG tablet Take 650 mg by mouth every 6 (six) hours as needed.      Marland Kitchen ibuprofen  (ADVIL,MOTRIN) 200 MG tablet Take 600 mg by mouth every 6 (six) hours as needed.      . loratadine (CLARITIN) 10 MG tablet Take 1 tablet (10 mg total) by mouth daily.  30 tablet  2  . NIFEdipine (PROCARDIA XL/ADALAT-CC) 30 MG 24 hr tablet Take 30 mg by mouth daily after breakfast.       . triamcinolone ointment (KENALOG) 0.1 % Apply 1 application topically 2 (two) times daily as needed. Eczema      . HYDROcodone-acetaminophen (NORCO) 5-325 MG per tablet as needed.      . metroNIDAZOLE (FLAGYL) 500 MG tablet       . naproxen sodium (ANAPROX) 220 MG tablet Take 660 mg by mouth 2 (two) times daily as needed. Pain          Review of Systems  Genitourinary: Negative for dysuria, urgency and frequency.  All other systems reviewed and are negative.   Physical Exam   Blood pressure 116/68, pulse 86, temperature 97.8 F (36.6 C), resp. rate 18, height 5\' 5"  (1.651 m), weight 163  lb 3.2 oz (74.027 kg), last menstrual period 04/30/2011.  Physical Exam  Nursing note and vitals reviewed. Constitutional: She is oriented to person, place, and time. She appears well-developed and well-nourished. She appears distressed.       Breathing w ctx, grimaces, winces  HENT:  Head: Normocephalic.  Eyes: Pupils are equal, round, and reactive to light.  Neck: Normal range of motion.  Cardiovascular: Normal rate, regular rhythm and normal heart sounds.   Respiratory: Effort normal and breath sounds normal.  GI: Soft. Bowel sounds are normal. She exhibits mass. She exhibits no distension. There is no tenderness.       FH about 22wks +FHT's Uterus soft, mild ctx palpable  Non-tender to couch   Genitourinary: Vagina normal.       cx cl/th/high   Musculoskeletal: Normal range of motion. She exhibits no edema.  Neurological: She is alert and oriented to person, place, and time.  Skin: Skin is warm and dry.  Psychiatric: She has a normal mood and affect. Her behavior is normal.   FHT's 160 per doppler toco  - 3-4 min 30-40sec, vs frequent UI MAU Course  Procedures    Assessment and Plan  IUP at [redacted]w[redacted]d Preterm ctx, UI Known fibroid uterus Hx LTCS w 4wk post-op laparotomy for TOA and ahesions - 8 years ago Chronic HTN on procardia, stable  Wet prep - pending UA pending Per c/w Dr Pennie Rushing,  toradol 30mg  IV and 30mg  IM  IVF's LR 500cc bolus prometrium 200mg  PV    Terry Bryant M 09/06/2011, 12:08 AM   Addendum: 09/06/11 at 0200  Pain improved, rare ctx now UA WNL Wet prep + clue toco - occ ctx   DC'D home w Rx:  motrin 600mg  ATC,  prometrium 200mg  PV HS,  Flagyl 500mg  BID x7d  protonix 40mg   vicodin PRN  Keep f/u Monday Call w inc ctx, VB, LOF

## 2011-09-07 LAB — CULTURE, OB URINE: Colony Count: 50000

## 2011-09-10 ENCOUNTER — Ambulatory Visit (INDEPENDENT_AMBULATORY_CARE_PROVIDER_SITE_OTHER): Payer: BC Managed Care – PPO | Admitting: Obstetrics and Gynecology

## 2011-09-10 ENCOUNTER — Ambulatory Visit (INDEPENDENT_AMBULATORY_CARE_PROVIDER_SITE_OTHER): Payer: BC Managed Care – PPO

## 2011-09-10 ENCOUNTER — Encounter: Payer: Self-pay | Admitting: Obstetrics and Gynecology

## 2011-09-10 VITALS — BP 102/64 | Wt 163.0 lb

## 2011-09-10 DIAGNOSIS — D259 Leiomyoma of uterus, unspecified: Secondary | ICD-10-CM

## 2011-09-10 DIAGNOSIS — Z3689 Encounter for other specified antenatal screening: Secondary | ICD-10-CM

## 2011-09-10 DIAGNOSIS — D219 Benign neoplasm of connective and other soft tissue, unspecified: Secondary | ICD-10-CM

## 2011-09-10 MED ORDER — VALACYCLOVIR HCL 500 MG PO TABS: ORAL_TABLET | ORAL | Status: DC

## 2011-09-10 NOTE — Progress Notes (Signed)
C/o bloody stools yesterday.

## 2011-09-10 NOTE — Progress Notes (Signed)
Ultrasound shows:  SIUP  S=D     [place] EDD: 02/04/2012            AFI: n/a           Cervical length: 4.47 cm           Placenta localization: anterior           Fetal presentation: Vertex                   Anatomy survey is normal  But all chambers of heart unseen           Gender : female    7 measurable fibroids 2-5cm in diameter Was seen in MAU on 09/06/11 with pain and contractions. Now improved with ATC motrin and vaginal progesterone.  Explained that there was no data to support use of progesterone, but it is unlikely to cause harm.  Pt wants to continue till 24 weeks. Needs followup US at that time to complete cardiac anatomy

## 2011-09-12 ENCOUNTER — Encounter: Payer: BC Managed Care – PPO | Admitting: Obstetrics and Gynecology

## 2011-09-13 LAB — US OB COMP + 14 WK

## 2011-10-06 ENCOUNTER — Telehealth: Payer: Self-pay | Admitting: Obstetrics and Gynecology

## 2011-10-06 NOTE — Telephone Encounter (Signed)
TC from patient--24 weeks with cold sx. Nasal congestion, sore throat. Denies fever, cough, or other OB sx. Wants to know what she can take.  Recommended Sudafed, Tylenol, saline nasal spray, rest, fluids, Vicks, humidifier, throat lozegens.  Has appointment on Monday--will f/u then or prn.

## 2011-10-08 ENCOUNTER — Other Ambulatory Visit: Payer: BC Managed Care – PPO

## 2011-10-08 ENCOUNTER — Ambulatory Visit (INDEPENDENT_AMBULATORY_CARE_PROVIDER_SITE_OTHER): Payer: BC Managed Care – PPO | Admitting: Obstetrics and Gynecology

## 2011-10-08 ENCOUNTER — Ambulatory Visit (INDEPENDENT_AMBULATORY_CARE_PROVIDER_SITE_OTHER): Payer: BC Managed Care – PPO

## 2011-10-08 ENCOUNTER — Encounter: Payer: Self-pay | Admitting: Obstetrics and Gynecology

## 2011-10-08 ENCOUNTER — Other Ambulatory Visit: Payer: Self-pay | Admitting: Obstetrics and Gynecology

## 2011-10-08 VITALS — BP 126/68 | Wt 166.5 lb

## 2011-10-08 DIAGNOSIS — Z3689 Encounter for other specified antenatal screening: Secondary | ICD-10-CM

## 2011-10-08 DIAGNOSIS — Z331 Pregnant state, incidental: Secondary | ICD-10-CM

## 2011-10-08 DIAGNOSIS — D219 Benign neoplasm of connective and other soft tissue, unspecified: Secondary | ICD-10-CM

## 2011-10-08 DIAGNOSIS — D259 Leiomyoma of uterus, unspecified: Secondary | ICD-10-CM

## 2011-10-08 LAB — US OB FOLLOW UP

## 2011-10-08 MED ORDER — NIFEDIPINE ER OSMOTIC RELEASE 30 MG PO TB24: 30.0000 mg | ORAL_TABLET | Freq: Every day | ORAL | Status: DC

## 2011-10-08 NOTE — Progress Notes (Signed)
Patient ID: Terry Bryant, female   DOB: 1975/02/25, 36 y.o.   MRN: [id number]  c/o of nasal congestion, runny nose, sneezing, no cough, discussed zyrtec, nettie pot, Refill on procardia XL. F/O [place] cardiac WNL cervix 3.42 anterior placenta fibroids: 4x4x4.5, 3.1x2.8x3.2, 3x3x2.5 Wants f/o with Dr. Estanislado Pandy hx of C/S and LSO, wants repeat C/S Lavera Guise, CNM

## 2011-10-16 ENCOUNTER — Telehealth: Payer: Self-pay | Admitting: Obstetrics and Gynecology

## 2011-10-16 NOTE — Telephone Encounter (Signed)
TC to pt. LM to return call regarding message. 

## 2011-10-16 NOTE — Telephone Encounter (Signed)
Nancy/cld pt

## 2011-10-16 NOTE — Telephone Encounter (Signed)
Tc from pt. Pt c/o right-sided abd pain since yesterday. No vaginal bldg or watery d/c. No fever. Positive fetal movement. Pt has tried Tylenol since yesterday without improvement. Pt admits to drinking approx 5 water bottles daily. Informed pt to increase water intake(64oz daily), apply warm heating pad/compress to area, and/or warm tub bath. Pt may try Ibuprofen 600mg  every 6 hrs x 24 hours. Told pt if no improvement or sx's worsen to cb. Pt agrees.

## 2011-10-17 ENCOUNTER — Telehealth: Payer: Self-pay | Admitting: Obstetrics and Gynecology

## 2011-10-17 NOTE — Telephone Encounter (Signed)
Pt called stating that she still has some of the R sided discomfort. She is taking Ib. Prn for that, prn. Today she has noticed a decrease in FM. She is 24.2 days  Today. She has felt baby move about ten times so far. I recommended that she drink some orange juice or a caffeinated beverage and to eat a high protein snack and then closely monitor FM for about an hr and a half and then report back to me at direct ext. Pt is agreeable. Melody Comas A

## 2011-10-18 ENCOUNTER — Telehealth: Payer: Self-pay | Admitting: Obstetrics and Gynecology

## 2011-10-18 ENCOUNTER — Inpatient Hospital Stay (HOSPITAL_COMMUNITY)
Admission: AD | Admit: 2011-10-18 | Discharge: 2011-10-18 | Disposition: A | Discharge status: Home / Self Care | Payer: BC Managed Care – PPO | Source: Ambulatory Visit | Attending: Obstetrics and Gynecology | Admitting: Obstetrics and Gynecology

## 2011-10-18 ENCOUNTER — Encounter: Payer: Self-pay | Admitting: Obstetrics and Gynecology

## 2011-10-18 ENCOUNTER — Encounter (HOSPITAL_COMMUNITY): Payer: Self-pay | Admitting: *Deleted

## 2011-10-18 DIAGNOSIS — D259 Leiomyoma of uterus, unspecified: Secondary | ICD-10-CM

## 2011-10-18 DIAGNOSIS — N760 Acute vaginitis: Secondary | ICD-10-CM | POA: Insufficient documentation

## 2011-10-18 DIAGNOSIS — A499 Bacterial infection, unspecified: Secondary | ICD-10-CM | POA: Insufficient documentation

## 2011-10-18 DIAGNOSIS — O2390 Unspecified genitourinary tract infection in pregnancy, unspecified trimester: Secondary | ICD-10-CM | POA: Insufficient documentation

## 2011-10-18 DIAGNOSIS — D249 Benign neoplasm of unspecified breast: Secondary | ICD-10-CM

## 2011-10-18 DIAGNOSIS — R109 Unspecified abdominal pain: Secondary | ICD-10-CM | POA: Insufficient documentation

## 2011-10-18 DIAGNOSIS — O4700 False labor before 37 completed weeks of gestation, unspecified trimester: Secondary | ICD-10-CM

## 2011-10-18 DIAGNOSIS — I10 Essential (primary) hypertension: Secondary | ICD-10-CM

## 2011-10-18 DIAGNOSIS — B9689 Other specified bacterial agents as the cause of diseases classified elsewhere: Secondary | ICD-10-CM | POA: Insufficient documentation

## 2011-10-18 LAB — FETAL FIBRONECTIN: Fetal Fibronectin: NEGATIVE

## 2011-10-18 MED ORDER — NIFEDIPINE 10 MG PO CAPS
10.0000 mg | ORAL_CAPSULE | ORAL | Status: AC
  Administered 2011-10-18 (×3): 10 mg via ORAL
  Filled 2011-10-18 (×3): qty 1

## 2011-10-18 MED ORDER — LACTATED RINGERS IV BOLUS (SEPSIS)
500.0000 mL | Freq: Once | INTRAVENOUS | Status: AC
  Administered 2011-10-18: 13:00:00 via INTRAVENOUS

## 2011-10-18 NOTE — Progress Notes (Signed)
CNM called to check on pt., uc's are less frequent since IV bolus, order received for FFN & SVE.

## 2011-10-18 NOTE — MAU Note (Signed)
Patient states she is having contractions every 10 minutes, denies any bleeding or leaking. Reports good fetal movement.

## 2011-10-18 NOTE — Telephone Encounter (Signed)
Nancy/ob °

## 2011-10-18 NOTE — Telephone Encounter (Signed)
TC to pt. States has been having contractions since 10/15/11.  Taking Tylenol and Motrin without relief.  Had decreased FM 10/17/11 but increased after drinking caffeine.  Today is  Having cramping/contractions q 10 min.   Per DD pt to MAU.  VM left for SL.

## 2011-10-19 ENCOUNTER — Telehealth: Payer: Self-pay | Admitting: Obstetrics and Gynecology

## 2011-10-22 ENCOUNTER — Telehealth: Payer: Self-pay

## 2011-10-22 ENCOUNTER — Ambulatory Visit (INDEPENDENT_AMBULATORY_CARE_PROVIDER_SITE_OTHER): Payer: BC Managed Care – PPO | Admitting: Obstetrics and Gynecology

## 2011-10-22 ENCOUNTER — Encounter: Payer: Self-pay | Admitting: Obstetrics and Gynecology

## 2011-10-22 VITALS — BP 104/60 | Wt 168.0 lb

## 2011-10-22 DIAGNOSIS — Z331 Pregnant state, incidental: Secondary | ICD-10-CM

## 2011-10-22 MED ORDER — IBUPROFEN 600 MG PO TABS: 600.0000 mg | ORAL_TABLET | Freq: Four times a day (QID) | ORAL | Status: AC | PRN

## 2011-10-22 NOTE — Progress Notes (Signed)
[redacted]w[redacted]d Pt was seen at MAU on 10/18/2011 for cx's. FFN was done and was Negative Pt states that she is still having some cx's but they are not regular.

## 2011-10-22 NOTE — Telephone Encounter (Signed)
Late entry for 10/17/2011. Pt called back to report that FM has improved after eating snack and having something sweet/caffeinated to drink. The baby has really become a lot more active. I encouraged pt to continue to observe and certainly let us know if that happens again. Pt voices understanding. Melody Comas A

## 2011-10-22 NOTE — Progress Notes (Signed)
[redacted]w[redacted]d CHTN on Procardia XL 30 mg daily: no symptoms of PIH Contractions irregular, R>L, FFN negative: Ibuprofen 600 mg every 6 hours as needed Previous c/s: desires VBAC. R&B reviewed. Consent given

## 2011-10-24 NOTE — MAU Provider Note (Signed)
  History     CSN: [id number]  Arrival date and time: 10/18/11 1152   None     Chief Complaint  Patient presents with  . Labor Eval   HPI Comments: Pt is a 36yo G2P1 at 24wks w c/o ctx. She denies any VB or LOF, +FM. VE per RN cl/th/high     Past Medical History  Diagnosis Date  . Hypertension   . 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 Endometriosis of colon 2009  . Pelvic adhesions   . Pneumonia AGE 76  . Blood transfusion 02-16-11    '05 with ovary surgery  . Preterm labor 2004  . Infection     OCC  . Infection AGE 2    CHLAMYDIA  . Anemia     TAKES FE OCC  . Fibroids 2004  . Hypertension 2007    DR [facility]   ON MED  . Eczema     Past Surgical History  Procedure Date  . Cesarean section   . Breast cyst excision 02/22/2011    Procedure: CYST EXCISION BREAST;  Surgeon: Currie Paris, MD;  Location: [facility];  Service: General;  Laterality: Left;  Removal Left Breast Mass  . Salpyngooophorectomy     LSO 2005 for TOA    Family History  Problem Relation Age of Onset  . Heart disease Mother   . Diabetes Father   . Heart disease Father     DIED AT 93  . Hypertension Maternal Grandmother   . Stroke Maternal Grandmother   . Heart disease Maternal Grandfather   . Diabetes Maternal Grandfather   . Lupus Sister   . Asthma Daughter     History  Substance Use Topics  . Smoking status: Never Smoker   . Smokeless tobacco: Never Used  . Alcohol Use: No     rarely  NONE SINCE PREGNANT    Allergies:  Allergies  Allergen Reactions  . Bee Venom Hives and Swelling  . Penicillins Swelling    A problem from birth -unknown reaction    No prescriptions prior to admission    Review of Systems  All other systems reviewed and are negative.   Physical Exam   Blood pressure 121/71, pulse 90, temperature 98.1 F (36.7 C), temperature source Oral, resp. rate 18, height 5\' 4"  (1.626 m), weight 167 lb 12.8 oz (76.114 kg), last menstrual period 04/30/2011,  SpO2 100.00%.  Physical Exam  Nursing note and vitals reviewed. Constitutional: She is oriented to person, place, and time. She appears well-developed and well-nourished.  Neck: Normal range of motion.  Cardiovascular: Normal rate, regular rhythm and normal heart sounds.   Respiratory: Effort normal and breath sounds normal.  GI: Soft. Bowel sounds are normal.  Genitourinary: Vagina normal.  Musculoskeletal: Normal range of motion.  Neurological: She is alert and oriented to person, place, and time.  Skin: Skin is warm and dry.  Psychiatric: She has a normal mood and affect. Her behavior is normal.    MAU Course  Procedures    Assessment and Plan  IUP at 24wks No cervical change Ctx decreased after IVF's and procarda FFN neg Pt dc'd home in stable condition F/u in office next week   Terry Bryant M 10/24/2011, 3:33 AM

## 2011-11-07 ENCOUNTER — Other Ambulatory Visit: Payer: BC Managed Care – PPO

## 2011-11-07 ENCOUNTER — Ambulatory Visit (INDEPENDENT_AMBULATORY_CARE_PROVIDER_SITE_OTHER): Payer: BC Managed Care – PPO | Admitting: Obstetrics and Gynecology

## 2011-11-07 VITALS — BP 104/62 | Wt 173.0 lb

## 2011-11-07 DIAGNOSIS — Z3490 Encounter for supervision of normal pregnancy, unspecified, unspecified trimester: Secondary | ICD-10-CM

## 2011-11-07 DIAGNOSIS — Z98891 History of uterine scar from previous surgery: Secondary | ICD-10-CM

## 2011-11-07 DIAGNOSIS — I10 Essential (primary) hypertension: Secondary | ICD-10-CM

## 2011-11-07 DIAGNOSIS — Z331 Pregnant state, incidental: Secondary | ICD-10-CM

## 2011-11-07 DIAGNOSIS — Z9889 Other specified postprocedural states: Secondary | ICD-10-CM

## 2011-11-07 NOTE — Progress Notes (Signed)
[redacted]w[redacted]d CHTN on Procardia XL 30 mg daily some headaches GFM Some discharge without symptoms Still undecided for mode of delivery Fetal monitoring and plan of care reviewed

## 2011-11-07 NOTE — Progress Notes (Signed)
Pt stated been having headaches for the past week. Pt stated no other issues.  glucola given @ 3:35 draw @ 4:35 .

## 2011-11-08 ENCOUNTER — Telehealth: Payer: Self-pay | Admitting: Obstetrics and Gynecology

## 2011-11-08 LAB — CBC
Hemoglobin: 9.8 g/dL — ABNORMAL LOW (ref 12.0–15.0)
MCHC: 31 g/dL (ref 30.0–36.0)
RBC: 3.84 MIL/uL — ABNORMAL LOW (ref 3.87–5.11)
WBC: 7.6 10*3/uL (ref 4.0–10.5)

## 2011-11-08 LAB — GLUCOSE TOLERANCE, 1 HOUR (50G) W/O FASTING: Glucose, 1 Hour GTT: 118 mg/dL (ref 70–140)

## 2011-11-08 LAB — RPR

## 2011-11-08 MED ORDER — POLYSACCHARIDE IRON COMPLEX 150 MG PO CAPS: 150.0000 mg | ORAL_CAPSULE | Freq: Two times a day (BID) | ORAL | Status: DC

## 2011-11-08 NOTE — Telephone Encounter (Signed)
Message copied by Stephens Shire on Thu Nov 08, 2011  2:19 PM ------      Message from: Silverio Lay      Created: Thu Nov 08, 2011  1:28 PM       Please inform anemia. Please call in Nu-iron 150 mg BID #60  2 RF. To use with Colace BID

## 2011-11-08 NOTE — Progress Notes (Signed)
Quick Note:  Please inform anemia. Please call in Nu-iron 150 mg BID #60 2 RF. To use with Colace BID ______

## 2011-11-08 NOTE — Telephone Encounter (Signed)
LM for pt to call back regarding anemia.

## 2011-11-08 NOTE — Telephone Encounter (Signed)
Spoke with pt informed that iron was low and called in Rx.  Pt voiced understanding.

## 2011-11-10 ENCOUNTER — Inpatient Hospital Stay (HOSPITAL_COMMUNITY)
Admission: AD | Admit: 2011-11-10 | Discharge: 2011-11-10 | Disposition: A | Discharge status: Home / Self Care | Payer: BC Managed Care – PPO | Source: Ambulatory Visit | Attending: Obstetrics and Gynecology | Admitting: Obstetrics and Gynecology

## 2011-11-10 ENCOUNTER — Telehealth: Payer: Self-pay | Admitting: Obstetrics and Gynecology

## 2011-11-10 ENCOUNTER — Encounter (HOSPITAL_COMMUNITY): Payer: Self-pay | Admitting: Obstetrics and Gynecology

## 2011-11-10 DIAGNOSIS — O10019 Pre-existing essential hypertension complicating pregnancy, unspecified trimester: Secondary | ICD-10-CM | POA: Insufficient documentation

## 2011-11-10 DIAGNOSIS — O4700 False labor before 37 completed weeks of gestation, unspecified trimester: Secondary | ICD-10-CM

## 2011-11-10 DIAGNOSIS — E860 Dehydration: Secondary | ICD-10-CM

## 2011-11-10 DIAGNOSIS — O3410 Maternal care for benign tumor of corpus uteri, unspecified trimester: Secondary | ICD-10-CM

## 2011-11-10 DIAGNOSIS — D259 Leiomyoma of uterus, unspecified: Secondary | ICD-10-CM | POA: Insufficient documentation

## 2011-11-10 DIAGNOSIS — D249 Benign neoplasm of unspecified breast: Secondary | ICD-10-CM

## 2011-11-10 DIAGNOSIS — I10 Essential (primary) hypertension: Secondary | ICD-10-CM

## 2011-11-10 DIAGNOSIS — B9689 Other specified bacterial agents as the cause of diseases classified elsewhere: Secondary | ICD-10-CM

## 2011-11-10 LAB — WET PREP, GENITAL: Trich, Wet Prep: NONE SEEN

## 2011-11-10 LAB — URINALYSIS, ROUTINE W REFLEX MICROSCOPIC
Bilirubin Urine: NEGATIVE
Leukocytes, UA: NEGATIVE
Nitrite: NEGATIVE
Specific Gravity, Urine: >1.03 — ABNORMAL HIGH (ref 1.005–1.030)
Urobilinogen, UA: 1 mg/dL (ref 0.0–1.0)

## 2011-11-10 MED ORDER — IBUPROFEN 600 MG PO TABS: 600.0000 mg | ORAL_TABLET | Freq: Four times a day (QID) | ORAL | Status: DC

## 2011-11-10 MED ORDER — LACTATED RINGERS IV BOLUS (SEPSIS)
500.0000 mL | Freq: Once | INTRAVENOUS | Status: AC
  Administered 2011-11-10: 500 mL via INTRAVENOUS

## 2011-11-10 MED ORDER — IBUPROFEN 800 MG PO TABS
800.0000 mg | ORAL_TABLET | Freq: Once | ORAL | Status: AC
  Administered 2011-11-10: 800 mg via ORAL
  Filled 2011-11-10: qty 1

## 2011-11-10 MED ORDER — CYCLOBENZAPRINE HCL 10 MG PO TABS: 5.0000 mg | ORAL_TABLET | Freq: Three times a day (TID) | ORAL | Status: DC | PRN

## 2011-11-10 MED ORDER — CYCLOBENZAPRINE HCL 5 MG PO TABS: 5.0000 mg | ORAL_TABLET | Freq: Three times a day (TID) | ORAL | Status: DC | PRN

## 2011-11-10 NOTE — MAU Provider Note (Signed)
History     CSN: [id number]  Arrival date and time: 11/10/11 1657   First Provider Initiated Contact with Patient 11/10/11 1817      Chief Complaint  Patient presents with  . Contractions    HPI the patient presents this afternoon having had the acute onset of uterine contractions which rapidly proceeded to every 2-3 minutes and 9/10 in intensity. The patient has had 2 previous episodes of similar contractions 1 at 18 weeks and again at 24 weeks. At 18 weeks her contractions responded well to around-the-clock ibuprofen and vaginal progesterone. The episode at 24 weeks responded well to IV fluids and Procardia. The patient takes Procardia daily for hypertension. She denies any leakage of fluid any vaginal bleeding and has not recently had intercourse. She denies any urinary symptoms or gastrointestinal symptoms. She does have a history of uterine fibroids.   She has had one previous pregnancy delivered by cesarean section for nonreassuring fetal heart rate tracing in 2005. That postoperative course was complicated by a pelvic abscess requiring laparotomy and salpingo-oophorectomy.  OB History    Grav Para Term Preterm Abortions TAB SAB Ect Mult Living   2 1 1       1       Past Medical History  Diagnosis Date  . Hypertension   . 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 Endometriosis of colon 2009  . Pelvic adhesions   . Pneumonia AGE 36  . Blood transfusion 02-16-11    '05 with ovary surgery  . Preterm labor 2004  . Infection     OCC  . Infection AGE 38    CHLAMYDIA  . Anemia     TAKES FE OCC  . Fibroids 2004  . Hypertension 2007    DR [facility]   ON MED  . Eczema     Past Surgical History  Procedure Date  . Cesarean section   . Breast cyst excision 02/22/2011    Procedure: CYST EXCISION BREAST;  Surgeon: Currie Paris, MD;  Location: [facility];  Service: General;  Laterality: Left;  Removal Left Breast Mass  . Salpyngooophorectomy     LSO 2005 for TOA    Family  History  Problem Relation Age of Onset  . Heart disease Mother   . Diabetes Father   . Heart disease Father     DIED AT 10  . Hypertension Maternal Grandmother   . Stroke Maternal Grandmother   . Heart disease Maternal Grandfather   . Diabetes Maternal Grandfather   . Lupus Sister   . Asthma Daughter     History  Substance Use Topics  . Smoking status: Never Smoker   . Smokeless tobacco: Never Used  . Alcohol Use: No     rarely  NONE SINCE PREGNANT    Allergies:  Allergies  Allergen Reactions  . Bee Venom Hives and Swelling  . Penicillins Swelling    A problem from birth -unknown reaction    Prescriptions prior to admission  Medication Sig Dispense Refill  . acetaminophen (TYLENOL) 325 MG tablet Take 650 mg by mouth every 6 (six) hours as needed. For pain      . ibuprofen (ADVIL,MOTRIN) 200 MG tablet Take 600 mg by mouth every 6 (six) hours as needed. For pain      . iron polysaccharides (NU-IRON) 150 MG capsule Take 1 capsule (150 mg total) by mouth 2 (two) times daily.  60 capsule  3  . loratadine (CLARITIN) 10 MG tablet Take 1 tablet (  10 mg total) by mouth daily.  30 tablet  2  . NIFEdipine (PROCARDIA-XL/ADALAT-CC/NIFEDICAL-XL) 30 MG 24 hr tablet Take 1 tablet (30 mg total) by mouth daily after breakfast.  30 tablet  4  . Prenatal Vit-Fe Fumarate-FA (PRENATAL MULTIVITAMIN) TABS Take 1 tablet by mouth daily.      Marland Kitchen triamcinolone ointment (KENALOG) 0.1 % Apply 1 application topically daily as needed. Eczema        Review of Systems  Constitutional: Negative.   HENT: Negative.   Eyes: Negative.   Respiratory: Negative.   Cardiovascular: Negative.   Gastrointestinal: Negative.   Genitourinary: Negative.   Musculoskeletal: Negative.   Skin: Negative.   Neurological: Negative.   Endo/Heme/Allergies: Negative.    Physical Exam   Blood pressure 130/84, pulse 74, temperature 97.5 F (36.4 C), resp. rate 16, height 5\' 4"  (1.626 m), weight 169 lb (76.658 kg), last  menstrual period 04/30/2011, SpO2 100.00%.  Physical Exam  Constitutional: She appears well-developed and well-nourished.  GI: Soft.       Gravid uterus consistent with 27 weeks and 5 days.  Uterus is nontender.   Genitourinary: Vaginal discharge found.       Vaginal discharge is white and tan without any discoloration. There is no vaginal fluid which pools. Cervix is long and closed.    MAU Course  Procedures Cultures for gonorrhea and Chlamydia Fetal fibronectin Wet prep Urinalysis  Assessment and Plan  Impression: Preterm contractions without cervical change with history of same Patient admits to poor oral intake including fluids and food Uterine fibroid Chronic hypertension well controlled with Procardia  Plan: The patient is given a single dose of ibuprofen 800 mg orally IV fluid bolus Await results  Asaiah Scarber P 11/10/2011, 6:50 PM    Results for orders placed during the hospital encounter of 11/10/11 (from the past 24 hour(s))  URINALYSIS, ROUTINE W REFLEX MICROSCOPIC     Status: Abnormal   Collection Time   11/10/11  6:16 PM      Component Value Range   Color, Urine YELLOW  YELLOW   APPearance HAZY (*) CLEAR   Specific Gravity, Urine >1.030 (*) 1.005 - 1.030   pH 5.5  5.0 - 8.0   Glucose, UA NEGATIVE  NEGATIVE mg/dL   Hgb urine dipstick NEGATIVE  NEGATIVE   Bilirubin Urine NEGATIVE  NEGATIVE   Ketones, ur NEGATIVE  NEGATIVE mg/dL   Protein, ur NEGATIVE  NEGATIVE mg/dL   Urobilinogen, UA 1.0  0.0 - 1.0 mg/dL   Nitrite NEGATIVE  NEGATIVE   Leukocytes, UA NEGATIVE  NEGATIVE  WET PREP, GENITAL     Status: Abnormal   Collection Time   11/10/11  6:20 PM      Component Value Range   Yeast Wet Prep HPF POC NONE SEEN  NONE SEEN   Trich, Wet Prep NONE SEEN  NONE SEEN   Clue Cells Wet Prep HPF POC NONE SEEN  NONE SEEN   WBC, Wet Prep HPF POC MODERATE (*) NONE SEEN  FETAL FIBRONECTIN     Status: Normal   Collection Time   11/10/11  6:20 PM      Component  Value Range   Fetal Fibronectin NEGATIVE  NEGATIVE    Recommendation: Discharge home to increase fluid intake Ibuprofen 600 mg by mouth every 6 hours for 3 days then discontinue Flexeril 5 mg by mouth up to 3 times a day as needed for pain Keep followup appointment on 11/21/2011 at [facility] Continue Procardia for hypertension  Call for any worsening of condition

## 2011-11-10 NOTE — MAU Note (Signed)
Patient states she is having contractions every 3-4 minutes. Denies any bleeding or leaking but does have an increase in normal vaginal discharge. Reports good fetal movement.

## 2011-11-10 NOTE — Telephone Encounter (Signed)
TC from patient--27 5/7 weeks, sudden onset of pain just below pubic bone x 15 minutes.  No awareness of contractions, no dysuria, bleeding, N/V, diarrhea/constipation, or any other sx.  Notes +FM.  Hx remarkable for 7 fibroids 2-5 cm in diameter on 19 week [place], previous C/S in 2005, several MAU and office evaluations for cramping/pressure, with negative FFN 10/18/11; chronic hypertension on Procardia 30 mg XL.  Reviewed possibility of fibroids causing pain--will try Tylenol now.  Offered evaluation in MAU or continued observation of status.  Patient will continue to observe at present, but will come to MAU if no improvement.

## 2011-11-11 ENCOUNTER — Telehealth: Payer: Self-pay | Admitting: Obstetrics and Gynecology

## 2011-11-11 NOTE — Telephone Encounter (Signed)
TC from patient--Contractions since this am, now stronger and more frequent.  Does have fibroids.  No leaking or bleeding, reports +FM.  Recommended patient come to MAU.

## 2011-11-13 LAB — GC/CHLAMYDIA PROBE AMP, GENITAL
Chlamydia, DNA Probe: NEGATIVE
GC Probe Amp, Genital: NEGATIVE

## 2011-11-15 ENCOUNTER — Telehealth: Payer: Self-pay | Admitting: Obstetrics and Gynecology

## 2011-11-15 ENCOUNTER — Encounter (HOSPITAL_COMMUNITY): Payer: Self-pay | Admitting: *Deleted

## 2011-11-15 ENCOUNTER — Inpatient Hospital Stay (HOSPITAL_COMMUNITY)
Admission: AD | Admit: 2011-11-15 | Discharge: 2011-11-15 | Disposition: A | Discharge status: Home / Self Care | Payer: BC Managed Care – PPO | Source: Ambulatory Visit | Attending: Obstetrics and Gynecology | Admitting: Obstetrics and Gynecology

## 2011-11-15 DIAGNOSIS — I10 Essential (primary) hypertension: Secondary | ICD-10-CM

## 2011-11-15 DIAGNOSIS — D249 Benign neoplasm of unspecified breast: Secondary | ICD-10-CM

## 2011-11-15 DIAGNOSIS — N760 Acute vaginitis: Secondary | ICD-10-CM

## 2011-11-15 DIAGNOSIS — O4700 False labor before 37 completed weeks of gestation, unspecified trimester: Secondary | ICD-10-CM | POA: Insufficient documentation

## 2011-11-15 DIAGNOSIS — D259 Leiomyoma of uterus, unspecified: Secondary | ICD-10-CM

## 2011-11-15 DIAGNOSIS — O09219 Supervision of pregnancy with history of pre-term labor, unspecified trimester: Secondary | ICD-10-CM

## 2011-11-15 DIAGNOSIS — R609 Edema, unspecified: Secondary | ICD-10-CM

## 2011-11-15 DIAGNOSIS — O479 False labor, unspecified: Secondary | ICD-10-CM

## 2011-11-15 LAB — URINALYSIS, ROUTINE W REFLEX MICROSCOPIC
Bilirubin Urine: NEGATIVE
Glucose, UA: NEGATIVE mg/dL
Hgb urine dipstick: NEGATIVE
Specific Gravity, Urine: 1.01 (ref 1.005–1.030)
Urobilinogen, UA: 0.2 mg/dL (ref 0.0–1.0)

## 2011-11-15 NOTE — Progress Notes (Signed)
History  Terry Bryant is a 36 y.o. G2P1001 at [redacted]w[redacted]d  C/o of swelling yesterday to ankles denies ha, visual spots bluurring, epigastric pain, no swelling to face or hands. No vag bleeding or srom, with +FM.    Chief Complaint  Patient presents with  . Labor Eval   @SFHPI @  Prior to Admission medications   Medication Sig Start Date End Date Taking? Authorizing Provider  acetaminophen (TYLENOL) 325 MG tablet Take 650 mg by mouth every 6 (six) hours as needed. For pain   Yes Historical Provider, MD  ibuprofen (ADVIL,MOTRIN) 600 MG tablet Take 1 tablet (600 mg total) by mouth every 6 (six) hours. 11/10/11  Yes Hal Morales, MD  iron polysaccharides (NU-IRON) 150 MG capsule Take 1 capsule (150 mg total) by mouth 2 (two) times daily. 11/08/11  Yes Crist Fat Rivard, MD  loratadine (CLARITIN) 10 MG tablet Take 1 tablet (10 mg total) by mouth daily. 08/31/11 08/30/12 Yes Earl Gala, CNM  NIFEdipine (PROCARDIA-XL/ADALAT-CC/NIFEDICAL-XL) 30 MG 24 hr tablet Take 1 tablet (30 mg total) by mouth daily after breakfast. 10/08/11  Yes Lavera Guise, CNM  Prenatal Vit-Fe Fumarate-FA (PRENATAL MULTIVITAMIN) TABS Take 1 tablet by mouth daily.   Yes Historical Provider, MD  triamcinolone ointment (KENALOG) 0.1 % Apply 1 application topically daily as needed. For eczema   Yes Historical Provider, MD  cyclobenzaprine (FLEXERIL) 5 MG tablet Take 1 tablet (5 mg total) by mouth 3 (three) times daily as needed for muscle spasms (Irregular contraction pain). 11/10/11   Hal Morales, MD    Patient Active Problem List  Diagnosis  . Endometriosis of colon  . Pelvic adhesions  . H/O cesarean section  . Anemia  . Hypertension  . Bacterial vaginosis  . Fibroid uterus  . Intraductal papilloma of breast   Vitals:  Blood pressure 122/75, pulse 92, temperature 97.3 F (36.3 C), temperature source Oral, height 5\' 4"  (1.626 m), weight 174 lb 6.4 oz (79.107 kg), last menstrual period 04/30/2011, SpO2 99.00%. OB  History    Grav Para Term Preterm Abortions TAB SAB Ect Mult Living   2 1 1       1      11/15/2011 8:51 PM   Medication List     As of 11/15/2011  8:51 PM    ASK your doctor about these medications         acetaminophen 325 MG tablet   Commonly known as: TYLENOL   Take 650 mg by mouth every 6 (six) hours as needed. For pain      cyclobenzaprine 5 MG tablet   Commonly known as: FLEXERIL   Take 1 tablet (5 mg total) by mouth 3 (three) times daily as needed for muscle spasms (Irregular contraction pain).      ibuprofen 600 MG tablet   Commonly known as: ADVIL,MOTRIN   Take 1 tablet (600 mg total) by mouth every 6 (six) hours.      iron polysaccharides 150 MG capsule   Commonly known as: NIFEREX   Take 1 capsule (150 mg total) by mouth 2 (two) times daily.      loratadine 10 MG tablet   Commonly known as: CLARITIN   Take 1 tablet (10 mg total) by mouth daily.      NIFEdipine 30 MG 24 hr tablet   Commonly known as: PROCARDIA-XL/ADALAT-CC/NIFEDICAL-XL   Take 1 tablet (30 mg total) by mouth daily after breakfast.      prenatal multivitamin Tabs   Take 1 tablet  by mouth daily.      triamcinolone ointment 0.1 %   Commonly known as: KENALOG   Apply 1 application topically daily as needed. For eczema        ,   Terry Bryant  Past Medical History  Diagnosis Date  . Hypertension   . 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 Endometriosis of colon 2009  . Pelvic adhesions   . Pneumonia AGE 29  . Blood transfusion 02-16-11    '05 with ovary surgery  . Preterm labor 2004  . Infection     OCC  . Infection AGE 31    CHLAMYDIA  . Anemia     TAKES FE OCC  . Fibroids 2004  . Hypertension 2007    DR [facility]   ON MED  . Eczema     Past Surgical History  Procedure Date  . Cesarean section   . Breast cyst excision 02/22/2011    Procedure: CYST EXCISION BREAST;  Surgeon: Currie Paris, MD;  Location: [facility];  Service: General;  Laterality: Left;  Removal Left  Breast Mass  . Salpyngooophorectomy     LSO 2005 for TOA    Family History  Problem Relation Age of Onset  . Heart disease Mother   . Diabetes Father   . Heart disease Father     DIED AT 60  . Hypertension Maternal Grandmother   . Stroke Maternal Grandmother   . Heart disease Maternal Grandfather   . Diabetes Maternal Grandfather   . Lupus Sister   . Asthma Daughter     History  Substance Use Topics  . Smoking status: Never Smoker   . Smokeless tobacco: Never Used  . Alcohol Use: No     rarely  NONE SINCE PREGNANT    Allergies:  Allergies  Allergen Reactions  . Bee Venom Hives and Swelling  . Penicillins Swelling    A problem from birth -unknown reaction    Prescriptions prior to admission  Medication Sig Dispense Refill  . acetaminophen (TYLENOL) 325 MG tablet Take 650 mg by mouth every 6 (six) hours as needed. For pain      . ibuprofen (ADVIL,MOTRIN) 600 MG tablet Take 1 tablet (600 mg total) by mouth every 6 (six) hours.  60 tablet  2  . iron polysaccharides (NU-IRON) 150 MG capsule Take 1 capsule (150 mg total) by mouth 2 (two) times daily.  60 capsule  3  . loratadine (CLARITIN) 10 MG tablet Take 1 tablet (10 mg total) by mouth daily.  30 tablet  2  . NIFEdipine (PROCARDIA-XL/ADALAT-CC/NIFEDICAL-XL) 30 MG 24 hr tablet Take 1 tablet (30 mg total) by mouth daily after breakfast.  30 tablet  4  . Prenatal Vit-Fe Fumarate-FA (PRENATAL MULTIVITAMIN) TABS Take 1 tablet by mouth daily.      Marland Kitchen triamcinolone ointment (KENALOG) 0.1 % Apply 1 application topically daily as needed. For eczema      . cyclobenzaprine (FLEXERIL) 5 MG tablet Take 1 tablet (5 mg total) by mouth 3 (three) times daily as needed for muscle spasms (Irregular contraction pain).  30 tablet  1    @ROS @ Physical Exam   Blood pressure 122/75, pulse 92, temperature 97.3 F (36.3 C), temperature source Oral, height 5\' 4"  (1.626 m), weight 174 lb 6.4 oz (79.107 kg), last menstrual period 04/30/2011, SpO2  99.00%.  @PHYSEXAMBYAGE2 @ Labs:  Recent Results (from the past 24 hour(s))  URINALYSIS, ROUTINE W REFLEX MICROSCOPIC   Collection Time   11/15/11  8:00  PM      Component Value Range   Color, Urine YELLOW  YELLOW   APPearance CLEAR  CLEAR   Specific Gravity, Urine 1.010  1.005 - 1.030   pH 5.5  5.0 - 8.0   Glucose, UA NEGATIVE  NEGATIVE mg/dL   Hgb urine dipstick NEGATIVE  NEGATIVE   Bilirubin Urine NEGATIVE  NEGATIVE   Ketones, ur 40 (*) NEGATIVE mg/dL   Protein, ur NEGATIVE  NEGATIVE mg/dL   Urobilinogen, UA 0.2  0.0 - 1.0 mg/dL   Nitrite NEGATIVE  NEGATIVE   Leukocytes, UA NEGATIVE  NEGATIVE   ASSESSMENT: Patient Active Problem List  Diagnosis  . Endometriosis of colon  . Pelvic adhesions  . H/O cesarean section  . Anemia  . Hypertension  . Bacterial vaginosis  . Fibroid uterus  . Intraductal papilloma of breast   Physical Examination:  {General appearance - {alert, well appearing, and in no distress:abd soft, gravid, nt,  abdomen nontender No edema to lower extremities FHTS category 1 UC slight irritability resolving ED Course  Assessment/Plan [redacted]w[redacted]d Not in preterm labor, encouraged po fluids, protein snacks and meals. F/o as scheduled. Lavera Guise, CNM

## 2011-11-15 NOTE — MAU Note (Signed)
Patient states she is having contraction every 5 minutes. Denies any bleeding or leaking and reports good fetal movement.

## 2011-11-15 NOTE — Telephone Encounter (Signed)
TC from pt.  States is having cont q 3-4 min x q5 min.  +FM.  Has had 8+ glasses of water. Was seen 11/10/11 for dehydration and took Motrin x 3 days. Is having increased edema and headaches x a few days. Per MK pt to MAU. Pt verbalizes comprehension.

## 2011-11-21 ENCOUNTER — Ambulatory Visit (INDEPENDENT_AMBULATORY_CARE_PROVIDER_SITE_OTHER): Payer: BC Managed Care – PPO

## 2011-11-21 ENCOUNTER — Ambulatory Visit (INDEPENDENT_AMBULATORY_CARE_PROVIDER_SITE_OTHER): Payer: BC Managed Care – PPO | Admitting: Obstetrics and Gynecology

## 2011-11-21 ENCOUNTER — Encounter: Payer: BC Managed Care – PPO | Admitting: Obstetrics and Gynecology

## 2011-11-21 VITALS — BP 100/72 | Wt 176.0 lb

## 2011-11-21 DIAGNOSIS — Z331 Pregnant state, incidental: Secondary | ICD-10-CM

## 2011-11-21 DIAGNOSIS — I10 Essential (primary) hypertension: Secondary | ICD-10-CM

## 2011-11-21 DIAGNOSIS — O169 Unspecified maternal hypertension, unspecified trimester: Secondary | ICD-10-CM

## 2011-11-21 MED ORDER — NIFEDIPINE 10 MG PO CAPS: 10.0000 mg | ORAL_CAPSULE | ORAL | Status: DC | PRN

## 2011-11-21 NOTE — Progress Notes (Deleted)
[redacted]w[redacted]d   C/O:Increased pressure since Sunday.   U/S: CHTN, EFW  Frank Breech Presentation, Anterioe Placenta, AFI is Normal (85th%) Fibroids appear stable, Cervix is closed, Normal [place].

## 2011-11-21 NOTE — Progress Notes (Signed)
[redacted]w[redacted]d Ultrasound: Single gestation, frank breech presentation, 3 lbs. 4 oz. (57th percentile), cervix 4.9 cm, normal fluid. The patient complains of irregular painful contractions.  She takes Procardia XL 30 for her blood pressure.  She finds it difficult to work. Add Procardia 10 mg every 4 hours as needed for painful contractions. Hemoglobin 9.8.  Glucola 118.  RPR nonreactive. Return to office in 2 weeks. Antenatal testing starting at 32 weeks. Dr. Stefano Gaul

## 2011-11-22 LAB — US OB FOLLOW UP

## 2011-12-05 ENCOUNTER — Inpatient Hospital Stay (HOSPITAL_COMMUNITY)
Admission: AD | Admit: 2011-12-05 | Discharge: 2011-12-05 | Disposition: A | Discharge status: Home / Self Care | Payer: BC Managed Care – PPO | Source: Ambulatory Visit | Attending: Obstetrics and Gynecology | Admitting: Obstetrics and Gynecology

## 2011-12-05 ENCOUNTER — Ambulatory Visit (INDEPENDENT_AMBULATORY_CARE_PROVIDER_SITE_OTHER): Payer: BC Managed Care – PPO | Admitting: Obstetrics and Gynecology

## 2011-12-05 ENCOUNTER — Encounter: Payer: Self-pay | Admitting: Obstetrics and Gynecology

## 2011-12-05 ENCOUNTER — Encounter (HOSPITAL_COMMUNITY): Payer: Self-pay | Admitting: *Deleted

## 2011-12-05 VITALS — BP 112/76 | Wt 175.0 lb

## 2011-12-05 DIAGNOSIS — O479 False labor, unspecified: Secondary | ICD-10-CM

## 2011-12-05 DIAGNOSIS — O4700 False labor before 37 completed weeks of gestation, unspecified trimester: Secondary | ICD-10-CM | POA: Insufficient documentation

## 2011-12-05 DIAGNOSIS — N760 Acute vaginitis: Secondary | ICD-10-CM

## 2011-12-05 DIAGNOSIS — B9689 Other specified bacterial agents as the cause of diseases classified elsewhere: Secondary | ICD-10-CM

## 2011-12-05 DIAGNOSIS — I10 Essential (primary) hypertension: Secondary | ICD-10-CM

## 2011-12-05 DIAGNOSIS — D259 Leiomyoma of uterus, unspecified: Secondary | ICD-10-CM

## 2011-12-05 DIAGNOSIS — Z8751 Personal history of pre-term labor: Secondary | ICD-10-CM

## 2011-12-05 DIAGNOSIS — D249 Benign neoplasm of unspecified breast: Secondary | ICD-10-CM

## 2011-12-05 LAB — URINALYSIS, ROUTINE W REFLEX MICROSCOPIC
Bilirubin Urine: NEGATIVE
Glucose, UA: NEGATIVE mg/dL
Hgb urine dipstick: NEGATIVE
Ketones, ur: NEGATIVE mg/dL
Protein, ur: NEGATIVE mg/dL
Urobilinogen, UA: 0.2 mg/dL (ref 0.0–1.0)

## 2011-12-05 MED ORDER — LACTATED RINGERS IV SOLN
INTRAVENOUS | Status: DC
  Administered 2011-12-05 (×2): via INTRAVENOUS

## 2011-12-05 MED ORDER — NIFEDIPINE 10 MG PO CAPS
10.0000 mg | ORAL_CAPSULE | Freq: Once | ORAL | Status: AC
  Administered 2011-12-05: 10 mg via ORAL
  Filled 2011-12-05: qty 1

## 2011-12-05 NOTE — MAU Note (Signed)
Pt sent from MD office for further evaluation, was having uc's in office, SVE & FFN done, pt states she is closed.  Pt denies bleeding or LOF.

## 2011-12-05 NOTE — Progress Notes (Signed)
Pt stated been having irregular painful contractions all day.

## 2011-12-05 NOTE — Progress Notes (Signed)
Comfortable, uc are minimal now compared to earlier O BP 114/72  Pulse 92  Temp 97.8 F (36.6 C) (Oral)  Resp 18  LMP 04/30/2011      fhts category 1      abd soft between uc      Contractions few      Vag LTC A [redacted]w[redacted]d P not in PTL, discharge Terry Bryant, CNM

## 2011-12-05 NOTE — Progress Notes (Signed)
[redacted]w[redacted]d Glucola normal C/O contractions all day, >10/hour, all painful, no change in discharge FFN done. OOW until 12/10/11 NST: contractions every 2 minutes. Pt sent to MAU

## 2011-12-05 NOTE — MAU Provider Note (Signed)
History   Terry Bryant was transferred from office after seeing Dr Estanislado Pandy today at the office with symptoms of Threatened PTL. Had prolonged monitoring at the office and was contracting every 2 - 4 mins. The patient describes her contractions as painful. At the office today FFN was collected and SVE per DR Rivard was Cx closed. Hx of Threantened PTL with last pregnancy but eventually delivered at term. For evaluation of Threatened PTL and prolonged monitoring. Steroids Deferred until [facility] result available.  CSN: [id number]  Arrival date and time: 12/05/11 1629   None     Chief Complaint  Patient presents with  . Laboring   HPI  OB History    Grav Para Term Preterm Abortions TAB SAB Ect Mult Living   2 1 1       1       Past Medical History  Diagnosis Date  . Hypertension   . 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 Endometriosis of colon 2009  . Pelvic adhesions   . Pneumonia AGE 36  . Blood transfusion 02-16-11    '05 with ovary surgery  . Preterm labor 2004  . Infection     OCC  . Infection AGE 36    CHLAMYDIA  . Anemia     TAKES FE OCC  . Fibroids 2004  . Hypertension 2007    DR [facility]   ON MED  . Eczema     Past Surgical History  Procedure Date  . Cesarean section   . Breast cyst excision 02/22/2011    Procedure: CYST EXCISION BREAST;  Surgeon: Currie Paris, MD;  Location: [facility];  Service: General;  Laterality: Left;  Removal Left Breast Mass  . Salpyngooophorectomy     LSO 2005 for TOA    Family History  Problem Relation Age of Onset  . Heart disease Mother   . Diabetes Father   . Heart disease Father     DIED AT 32  . Hypertension Maternal Grandmother   . Stroke Maternal Grandmother   . Heart disease Maternal Grandfather   . Diabetes Maternal Grandfather   . Lupus Sister   . Asthma Daughter     History  Substance Use Topics  . Smoking status: Never Smoker   . Smokeless tobacco: Never Used  . Alcohol Use: No     rarely  NONE SINCE  PREGNANT    Allergies:  Allergies  Allergen Reactions  . Bee Venom Hives and Swelling  . Penicillins Swelling    A problem from birth -unknown reaction    Prescriptions prior to admission  Medication Sig Dispense Refill  . acetaminophen (TYLENOL) 325 MG tablet Take 650 mg by mouth every 6 (six) hours as needed. For pain      . iron polysaccharides (NU-IRON) 150 MG capsule Take 1 capsule (150 mg total) by mouth 2 (two) times daily.  60 capsule  3  . NIFEdipine (PROCARDIA) 10 MG capsule Take 10 mg by mouth every 4 (four) hours as needed. For contractions      . NIFEdipine (PROCARDIA-XL/ADALAT-CC/NIFEDICAL-XL) 30 MG 24 hr tablet Take 30 mg by mouth daily after breakfast. For blood pressure      . Prenatal Vit-Fe Fumarate-FA (PRENATAL MULTIVITAMIN) TABS Take 1 tablet by mouth daily.      Marland Kitchen triamcinolone ointment (KENALOG) 0.1 % Apply 1 application topically daily as needed. For eczema        Review of Systems  Constitutional: Negative.   HENT: Negative.   Eyes:  Negative.   Respiratory: Negative.   Cardiovascular: Negative.   Gastrointestinal: Negative.   Genitourinary: Negative.   Musculoskeletal: Negative.   Skin: Negative.   Neurological: Negative.   Endo/Heme/Allergies: Negative.   Psychiatric/Behavioral: Negative.    Physical Exam   Blood pressure 114/72, pulse 92, temperature 97.8 F (36.6 C), temperature source Oral, resp. rate 18, last menstrual period 04/30/2011.  Physical Exam  Constitutional: She is oriented to person, place, and time. She appears well-developed and well-nourished.  HENT:  Head: Normocephalic and atraumatic.  Eyes: Conjunctivae normal and EOM are normal. Pupils are equal, round, and reactive to light.  Neck: Normal range of motion. Neck supple.  Cardiovascular: Normal rate, regular rhythm and normal heart sounds.   Respiratory: Effort normal and breath sounds normal.  GI: Soft.       Abdomen soft in between contractions  Genitourinary: Vagina  normal and uterus normal.       PT CTX  2 - 4 mins - CTX painful  Musculoskeletal: Normal range of motion.  Neurological: She is alert and oriented to person, place, and time. She has normal reflexes.  Skin: Skin is warm and dry.  Psychiatric: She has a normal mood and affect.    MAU Course  Procedures Collaboration with Dr Normand Sloop with Management Plan  IV Hydration Prolonged EFM Procardia 10 mg q 20 mins as per protocol FFN negative  Assessment and Plan  Threatened PTL  Meron Bocchino, CNM. 12/05/2011, 6:40 PM

## 2011-12-05 NOTE — MAU Note (Signed)
C/o pelvic pain since this AM around 0600; hx of preterm labor; hx of chronic high BP; seen in office today and sent from office to MAU;

## 2011-12-12 ENCOUNTER — Ambulatory Visit (INDEPENDENT_AMBULATORY_CARE_PROVIDER_SITE_OTHER): Payer: BC Managed Care – PPO | Admitting: Obstetrics and Gynecology

## 2011-12-12 ENCOUNTER — Encounter: Payer: Self-pay | Admitting: Obstetrics and Gynecology

## 2011-12-12 VITALS — BP 110/70 | Wt 178.0 lb

## 2011-12-12 DIAGNOSIS — Z331 Pregnant state, incidental: Secondary | ICD-10-CM

## 2011-12-12 DIAGNOSIS — Z23 Encounter for immunization: Secondary | ICD-10-CM

## 2011-12-12 DIAGNOSIS — O368190 Decreased fetal movements, unspecified trimester, not applicable or unspecified: Secondary | ICD-10-CM

## 2011-12-12 DIAGNOSIS — O169 Unspecified maternal hypertension, unspecified trimester: Secondary | ICD-10-CM

## 2011-12-12 NOTE — Progress Notes (Signed)
103w2d   Pt states baby has not been active the last two days

## 2011-12-12 NOTE — Progress Notes (Signed)
[redacted]w[redacted]d  CHTN on Procardia Decreased fetal movement: NST reactive

## 2011-12-18 ENCOUNTER — Ambulatory Visit (INDEPENDENT_AMBULATORY_CARE_PROVIDER_SITE_OTHER): Payer: BC Managed Care – PPO

## 2011-12-18 ENCOUNTER — Encounter: Payer: Self-pay | Admitting: Obstetrics and Gynecology

## 2011-12-18 ENCOUNTER — Ambulatory Visit (INDEPENDENT_AMBULATORY_CARE_PROVIDER_SITE_OTHER): Payer: BC Managed Care – PPO | Admitting: Obstetrics and Gynecology

## 2011-12-18 VITALS — BP 112/62 | Wt 184.0 lb

## 2011-12-18 DIAGNOSIS — D259 Leiomyoma of uterus, unspecified: Secondary | ICD-10-CM

## 2011-12-18 DIAGNOSIS — Z98891 History of uterine scar from previous surgery: Secondary | ICD-10-CM

## 2011-12-18 DIAGNOSIS — O169 Unspecified maternal hypertension, unspecified trimester: Secondary | ICD-10-CM

## 2011-12-18 DIAGNOSIS — I10 Essential (primary) hypertension: Secondary | ICD-10-CM

## 2011-12-18 DIAGNOSIS — Z9889 Other specified postprocedural states: Secondary | ICD-10-CM

## 2011-12-18 NOTE — Progress Notes (Signed)
[redacted]w[redacted]d  Ultrasound shows:  EFW: 5 lb 4 oz 77th%ILE        [place] EDD: 01/29/2012            AFI: 18.1 cm            Cervical length: 3.20 cm            Placenta localization: anterior            Fetal presentation: vertex    Anatomy survey completed yes    Anatomy survey is normal    Comments: Vertex presentation, Anterior placenta. Normal fluid. AFI = 65th%tile. Concordant growth: EFW = 77th%tile. No late presenting fetal abnormality. CX closed. Normal adnexa Wants VBAC if she labors before 39 weeks, but wants scheduled C/S at 39 wks ortherwise.  scheduled

## 2011-12-19 LAB — US OB FOLLOW UP

## 2011-12-20 ENCOUNTER — Other Ambulatory Visit: Payer: BC Managed Care – PPO

## 2011-12-20 ENCOUNTER — Encounter: Payer: BC Managed Care – PPO | Admitting: Obstetrics and Gynecology

## 2011-12-25 ENCOUNTER — Ambulatory Visit (INDEPENDENT_AMBULATORY_CARE_PROVIDER_SITE_OTHER): Payer: BC Managed Care – PPO | Admitting: Obstetrics and Gynecology

## 2011-12-25 ENCOUNTER — Telehealth: Payer: Self-pay | Admitting: Obstetrics and Gynecology

## 2011-12-25 VITALS — BP 110/72 | Wt 181.0 lb

## 2011-12-25 DIAGNOSIS — O1002 Pre-existing essential hypertension complicating childbirth: Secondary | ICD-10-CM

## 2011-12-25 DIAGNOSIS — I10 Essential (primary) hypertension: Secondary | ICD-10-CM

## 2011-12-25 NOTE — Progress Notes (Signed)
[redacted]w[redacted]d  C/O: irregular heart palpitations  Every other day with normal duties.

## 2011-12-25 NOTE — Telephone Encounter (Signed)
Repeat C/S possible BTL scheduled for 01/28/12 @ 11:30 with SR/VL.  -Adrianne Pridgen

## 2011-12-25 NOTE — Progress Notes (Signed)
[redacted]w[redacted]d Doing well. Chest: Clear.  Heart: Regular rate and rhythm.  Grade 2/6 systolic murmur.  The patient denies chest pain and shortness of breath.  She agrees to observe palpitations only. Takes Procardia daily.  We'll schedule biophysical profile this week and next week. Return office in 1 week. Dr. Stefano Gaul

## 2011-12-26 ENCOUNTER — Other Ambulatory Visit: Payer: Self-pay | Admitting: Obstetrics and Gynecology

## 2011-12-26 ENCOUNTER — Ambulatory Visit (INDEPENDENT_AMBULATORY_CARE_PROVIDER_SITE_OTHER): Payer: BC Managed Care – PPO

## 2011-12-26 ENCOUNTER — Ambulatory Visit (INDEPENDENT_AMBULATORY_CARE_PROVIDER_SITE_OTHER): Payer: BC Managed Care – PPO | Admitting: Obstetrics and Gynecology

## 2011-12-26 DIAGNOSIS — I10 Essential (primary) hypertension: Secondary | ICD-10-CM

## 2011-12-26 DIAGNOSIS — O169 Unspecified maternal hypertension, unspecified trimester: Secondary | ICD-10-CM

## 2011-12-26 NOTE — Progress Notes (Signed)
[redacted]w[redacted]d Ultrasound: Biophysical profile 8 out of 8, normal fluid, transverse presentation, anterior placenta. Return to office in 1 week. Biophysical profile next week because of a history of hypertension on Procardia. Dr. Stefano Gaul

## 2012-01-03 ENCOUNTER — Ambulatory Visit (INDEPENDENT_AMBULATORY_CARE_PROVIDER_SITE_OTHER): Payer: BC Managed Care – PPO | Admitting: Obstetrics and Gynecology

## 2012-01-03 ENCOUNTER — Encounter: Payer: Self-pay | Admitting: Obstetrics and Gynecology

## 2012-01-03 ENCOUNTER — Ambulatory Visit (INDEPENDENT_AMBULATORY_CARE_PROVIDER_SITE_OTHER): Payer: BC Managed Care – PPO

## 2012-01-03 ENCOUNTER — Other Ambulatory Visit: Payer: Self-pay

## 2012-01-03 VITALS — BP 124/80 | Wt 184.0 lb

## 2012-01-03 DIAGNOSIS — O169 Unspecified maternal hypertension, unspecified trimester: Secondary | ICD-10-CM

## 2012-01-03 DIAGNOSIS — I10 Essential (primary) hypertension: Secondary | ICD-10-CM

## 2012-01-03 NOTE — Progress Notes (Signed)
[redacted]w[redacted]d Ultrasound: Single gestation, transverse, normal fluid, biophysical profile 8 out of 8, cervix 4.29 cm. Return office in 1 week. Biophysical profile next week for hypertension. Cesarean section scheduled December 16 with tubal ligation. Dr. Stefano Gaul

## 2012-01-06 ENCOUNTER — Encounter: Payer: Self-pay | Admitting: Obstetrics and Gynecology

## 2012-01-06 DIAGNOSIS — Z880 Allergy status to penicillin: Secondary | ICD-10-CM | POA: Insufficient documentation

## 2012-01-06 NOTE — H&P (Addendum)
Terry Bryant is a 36 y.o. female, G2P1001 at 33 weeks, presenting on 01/28/12 for scheduled repeat cesarean birth and bilateral tubal ligation.  She had desired VBAC if labor occurred before 39 weeks, but desired to proceed with scheduled C/S if no labor by then.  Patient Active Problem List  Diagnosis  . Endometriosis of colon  . Pelvic adhesions  . H/O cesarean section  . Anemia  . Hypertension  . Bacterial vaginosis  . Fibroid uterus  . Intraductal papilloma of breast  . Penicillin allergy    History of present pregnancy: Patient entered care at 11 weeks.   EDC of 02/04/12 was established by LMP and confirmed by [place] at 11 weeks.   Anatomy scan:  19 weeks, with normal findings and an anterior placenta.  7 measurable fibroids noted, between 2 and 5 cm in diameter.  Limited heart anatomy seen. Additional [place] evaluations:  Repeat at 23 weeks to complete cardiac anatomy.  Fibroids stable, cervical length 3.42.  Repeat at 29 weeks for growth = 3+4, 57%ile, breech presentation, normal cervical length.  Repeat US at 33 weeks, with growth at 5+4, 77%ile, normal cervical length. Vtx.   Transverse presentation on [place] for BPP at 34 and 35 weeks, with normal fluid.    [place] on 11/25, with AFI 21.8, 80%ile, BPP 8/8, vtx. [place] on 12/2 Vtx, EFW 7+3, 71%ile, AFI 20.18, BPP 8/8 [place] on 12/9  Vtx, AFI 59%ile, BPP 8/8 Significant prenatal events:  Seen in MAU 7/25 due to cramping--was on Motrin and vaginal progesterone (per patient request) with benefit.  Had episode of bloody stools at 19 weeks, but no recurrence.  Maintained on Procardia 30 mg XL during pregnancy for chronic HTN.  Had repeat FFN 10/18/11 = negative.  By 23 weeks, had decided on repeat C/S.  Seen in MAU at 28 weeks for swelling, with negative findings.  Was placed on supplemental Procardia 10 mg po q 4 hours prn for contractions at 29 weeks.  Hgb was 9.8 at 29 weeks.  Was taken out of work at 29 weeks until 10/28, with improvement of discomfort.   Had some palpitations at 32 weeks, with 2/6 systolic murmur noted.  Denied chest pain or SOB, with plan made to observe.  Antenatal testing began at 32 weeks with weekly BPPs.  If she labored before her scheduled C/S, she wanted to try TOLAC.  BPs were stable on Procardia 30 mg XL.  She has also requested a tubal ligation with her C/S. Last evaluation:  12/9, stable.  OB History    Grav Para Term Preterm Abortions TAB SAB Ect Mult Living   2 1 1       1      Past Medical History  Diagnosis Date  . Hypertension   . 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 Endometriosis of colon 2009  . Pelvic adhesions   . Pneumonia AGE 32  . Blood transfusion 02-16-11    '05 with ovary surgery  . Preterm labor 2004  . Infection     OCC  . Infection AGE 68    CHLAMYDIA  . Anemia     TAKES FE OCC  . Fibroids 2004  . Hypertension 2007    DR [facility]   ON MED  . Eczema    Past Surgical History  Procedure Date  . Cesarean section   . Breast cyst excision 02/22/2011    Procedure: CYST EXCISION BREAST;  Surgeon: Currie Paris, MD;  Location: [facility];  Service: General;  Laterality: Left;  Removal Left Breast Mass  . Salpyngooophorectomy     LSO 2005 for TOA   Family History: family history includes Asthma in her daughter; Diabetes in her father and maternal grandfather; Heart disease in her father, maternal grandfather, and mother; Hypertension in her maternal grandmother; Lupus in her sister; and Stroke in her maternal grandmother.  Social History:  reports that she has never smoked. She has never used smokeless tobacco. She reports that she does not drink alcohol or use illicit drugs.  Employed with [employer].  Husband, Thayer Ohm, is involved and supportive.   Prenatal Transfer Tool  Maternal Diabetes: No Genetic Screening: Declined Maternal Ultrasounds/Referrals: Fibroids Fetal Ultrasounds or other Referrals:  None Maternal Substance Abuse:  No Significant Maternal Medications:   Vaginal progesterone until 24 weeks, Procardia 30 mg XL Significant Maternal Lab Results: Lab values include: Group B Strep negative    ROS:  + FM, occasional contractions.  Allergies  Allergen Reactions  . Bee Venom Hives and Swelling  . Penicillins Hives and Swelling    Documented treatment with amoxicillin in 2008 without difficulty       Height 5\' 6"  (1.676 m), weight 184 lb (83.462 kg), last menstrual period 04/30/2011.  Chest clear Heart RRR, grade 2/5 systolic murmur Abd gravid, NT, FH 39 cm Pelvic: Deferred Ext: WNL  FHR: 150 per doppler at office UCs:  Occasional per patient.  Prenatal labs: ABO, Rh: --/--/O POS, O POS (12/13 1500) Antibody: NEG (12/13 1500) Rubella:   Immune RPR: NON REAC (09/25 1635)  HBsAg: NEGATIVE (05/07 1530)  HIV: NON REACTIVE (05/07 1530)  GBS: Negative (11/25 0000)  Sickle cell/Hgb electrophoresis:  Negative in 1st pregnancy Pap:  October 2012 WNL GC:  Negative at NOB and in 3rd trimester Chlamydia:  Negative at NOB and in 3rd trimester Genetic screenings:  Declined Glucola:  Normal Other:  FFN negative 9/25, 9/28, 10/23       Assessment/Plan: IUP at 39 weeks Previous C/S, desires repeat GBS negative Chronic hypertension--on Procardia 30 mg XL  Plan: Admit on 01/28/12 to [facility] per consult with Dr. Normand Sloop Routine [facility] pre-op orders.  LATHAM, VICKICNM, [place] 01/25/2012, 9:56 PM     BTL reviewed with R&B Pt decides not to proceed with BTL Considering Mirena

## 2012-01-07 ENCOUNTER — Ambulatory Visit (INDEPENDENT_AMBULATORY_CARE_PROVIDER_SITE_OTHER): Payer: BC Managed Care – PPO | Admitting: Obstetrics and Gynecology

## 2012-01-07 ENCOUNTER — Other Ambulatory Visit: Payer: Self-pay | Admitting: Obstetrics and Gynecology

## 2012-01-07 ENCOUNTER — Other Ambulatory Visit: Payer: BC Managed Care – PPO

## 2012-01-07 VITALS — BP 112/82 | Wt 184.0 lb

## 2012-01-07 DIAGNOSIS — Z9889 Other specified postprocedural states: Secondary | ICD-10-CM

## 2012-01-07 DIAGNOSIS — O169 Unspecified maternal hypertension, unspecified trimester: Secondary | ICD-10-CM

## 2012-01-07 DIAGNOSIS — I10 Essential (primary) hypertension: Secondary | ICD-10-CM

## 2012-01-07 DIAGNOSIS — Z331 Pregnant state, incidental: Secondary | ICD-10-CM

## 2012-01-07 DIAGNOSIS — Z98891 History of uterine scar from previous surgery: Secondary | ICD-10-CM

## 2012-01-07 LAB — OB RESULTS CONSOLE GBS: GBS: NEGATIVE

## 2012-01-07 NOTE — Progress Notes (Signed)
Pt stated no issues today.  Ultrasound shows:  SIUP  S=D     [place] EDD: 02/03/2013           AFI: 21.8 = 80th %tile            Placenta localization: anterior           Fetal presentation: vertex Comments : upper normal fluid.AFI of 21.8 cm = 80th % tile.                        Score BPP 8 of 8 in 17 minutes.                       CX: not measured          Wants VBAC if delivers before 01/28/12, otherwise will have scheduled C/S. Has documentation in chart of taking Augmentin in 2008 without difficulty.  Will use Ancef if GBS positive  Growth U/S, BPP and visit next week

## 2012-01-08 ENCOUNTER — Encounter: Payer: BC Managed Care – PPO | Admitting: Obstetrics and Gynecology

## 2012-01-14 ENCOUNTER — Encounter: Payer: Self-pay | Admitting: Obstetrics and Gynecology

## 2012-01-14 ENCOUNTER — Ambulatory Visit (INDEPENDENT_AMBULATORY_CARE_PROVIDER_SITE_OTHER): Payer: BC Managed Care – PPO | Admitting: Obstetrics and Gynecology

## 2012-01-14 ENCOUNTER — Ambulatory Visit: Payer: BC Managed Care – PPO

## 2012-01-14 VITALS — BP 120/80 | Wt 187.0 lb

## 2012-01-14 DIAGNOSIS — Z98891 History of uterine scar from previous surgery: Secondary | ICD-10-CM

## 2012-01-14 DIAGNOSIS — I10 Essential (primary) hypertension: Secondary | ICD-10-CM

## 2012-01-14 DIAGNOSIS — D259 Leiomyoma of uterus, unspecified: Secondary | ICD-10-CM

## 2012-01-14 DIAGNOSIS — Z880 Allergy status to penicillin: Secondary | ICD-10-CM

## 2012-01-14 DIAGNOSIS — Z9889 Other specified postprocedural states: Secondary | ICD-10-CM

## 2012-01-14 NOTE — Progress Notes (Signed)
[redacted]w[redacted]d  No concerns per pt  Plans TOLAC if labors spontaneously (consent signed today).  If not, rpt c/s scheduled for 01/28/12  Ultrasound shows:  EFW 7lb 3oz @71 %ILE        [place] EDD: [redacted]w[redacted]d             AFI:20.18              Placenta localization: anterior            Fetal presentation: cephalic    Anatomy survey completed yes              Gender : female BPP=8/8

## 2012-01-16 ENCOUNTER — Telehealth: Payer: Self-pay | Admitting: Obstetrics and Gynecology

## 2012-01-16 NOTE — Telephone Encounter (Signed)
Tc to pt regarding msg.  Pt is 37w 2d, is currently on Procardia XL 30 mg, pt is having swelling in her hands and feet.  Pt denies headache or visual changes today.  Had headache yesterday,went away w/ Tylenol.  Pt has hx of CHTN.  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  Per SL, advised pt to continue monitoring.  Push fluids, when she gets home elevate feet as much as possible and call back if has HA that does not go away, visual changes or any other concerns.  Pt voices agreement.

## 2012-01-16 NOTE — Telephone Encounter (Signed)
Tc to pt regarding msg.  Lm on vm to call back. 

## 2012-01-17 ENCOUNTER — Telehealth: Payer: Self-pay | Admitting: Obstetrics and Gynecology

## 2012-01-17 NOTE — Telephone Encounter (Signed)
Pt called, is 37w 3d and wants to know what she can take OTC for acid reflux.  Pt advised may take TUMS, Zantac, Prilosec or Pepcid.  Pt voices agreement.

## 2012-01-18 ENCOUNTER — Other Ambulatory Visit: Payer: Self-pay

## 2012-01-18 DIAGNOSIS — O10019 Pre-existing essential hypertension complicating pregnancy, unspecified trimester: Secondary | ICD-10-CM

## 2012-01-18 DIAGNOSIS — I10 Essential (primary) hypertension: Secondary | ICD-10-CM

## 2012-01-21 ENCOUNTER — Encounter (HOSPITAL_COMMUNITY): Payer: Self-pay | Admitting: Pharmacist

## 2012-01-21 ENCOUNTER — Other Ambulatory Visit: Payer: Self-pay | Admitting: Obstetrics and Gynecology

## 2012-01-21 ENCOUNTER — Ambulatory Visit (INDEPENDENT_AMBULATORY_CARE_PROVIDER_SITE_OTHER): Payer: BC Managed Care – PPO | Admitting: Obstetrics and Gynecology

## 2012-01-21 ENCOUNTER — Ambulatory Visit (INDEPENDENT_AMBULATORY_CARE_PROVIDER_SITE_OTHER): Payer: BC Managed Care – PPO

## 2012-01-21 VITALS — BP 110/72 | Wt 190.0 lb

## 2012-01-21 DIAGNOSIS — O10019 Pre-existing essential hypertension complicating pregnancy, unspecified trimester: Secondary | ICD-10-CM

## 2012-01-21 DIAGNOSIS — Z331 Pregnant state, incidental: Secondary | ICD-10-CM

## 2012-01-21 NOTE — Progress Notes (Signed)
[redacted]w[redacted]d Pt complains of headache today. Pt had U/S today-CHTN,BPP, and growth.

## 2012-01-21 NOTE — Progress Notes (Signed)
[redacted]w[redacted]d Ultrasound: Single gestation, transverse, normal fluid, 38 weeks (59th percentile), biophysical profile 8 out of 8. History of chronic hypertension and prior cesarean delivery. The patient is scheduled for cesarean delivery at 39 weeks.  If she labors, and if the baby is vertex, then she wants to consider a trial of labor. Dr. Stefano Gaul

## 2012-01-25 ENCOUNTER — Encounter (HOSPITAL_COMMUNITY): Payer: Self-pay

## 2012-01-25 ENCOUNTER — Encounter (HOSPITAL_COMMUNITY)
Admission: RE | Admit: 2012-01-25 | Discharge: 2012-01-25 | Disposition: A | Discharge status: Home / Self Care | Payer: BC Managed Care – PPO | Source: Ambulatory Visit | Attending: Obstetrics and Gynecology | Admitting: Obstetrics and Gynecology

## 2012-01-25 LAB — CBC
Hemoglobin: 11 g/dL — ABNORMAL LOW (ref 12.0–15.0)
MCH: 25.1 pg — ABNORMAL LOW (ref 26.0–34.0)
MCV: 80.6 fL (ref 78.0–100.0)
Platelets: 164 10*3/uL (ref 150–400)
RBC: 4.39 MIL/uL (ref 3.87–5.11)
WBC: 6.3 10*3/uL (ref 4.0–10.5)

## 2012-01-25 LAB — US OB FOLLOW UP

## 2012-01-25 LAB — SURGICAL PCR SCREEN: MRSA, PCR: NEGATIVE

## 2012-01-25 LAB — TYPE AND SCREEN: Antibody Screen: NEGATIVE

## 2012-01-25 NOTE — Patient Instructions (Addendum)
20 [name]  01/25/2012   Your procedure is scheduled on:  01/28/12  Enter through the Main Entrance of [facility] at 8 AM.  Pick up the phone at the desk and dial [phone].   Call this number if you have problems the morning of surgery: [phone]   Remember:   Do not eat food:After Midnight.  Do not drink clear liquids: After Midnight.  Take these medicines the morning of surgery with A SIP OF WATER: Blood pressure medication   Do not wear jewelry, make-up or nail polish.  Do not wear lotions, powders, or perfumes. You may wear deodorant.  Do not shave 48 hours prior to surgery.  Do not bring valuables to the hospital.  Contacts, dentures or bridgework may not be worn into surgery.  Leave suitcase in the car. After surgery it may be brought to your room.  For patients admitted to the hospital, checkout time is 11:00 AM the day of discharge.   Patients discharged the day of surgery will not be allowed to drive home.  Name and phone number of your driver: NA  Special Instructions: Shower using CHG 2 nights before surgery and the night before surgery.  If you shower the day of surgery use CHG.  Use special wash - you have one bottle of CHG for all showers.  You should use approximately 1/3 of the bottle for each shower.   Please read over the following fact sheets that you were given: MRSA Information

## 2012-01-27 MED ORDER — GENTAMICIN SULFATE 40 MG/ML IJ SOLN
INTRAVENOUS | Status: AC
  Administered 2012-01-28: 100 mL via INTRAVENOUS
  Filled 2012-01-27: qty 8.63

## 2012-01-28 ENCOUNTER — Encounter (HOSPITAL_COMMUNITY): Payer: Self-pay | Admitting: Anesthesiology

## 2012-01-28 ENCOUNTER — Inpatient Hospital Stay (HOSPITAL_COMMUNITY): Payer: BC Managed Care – PPO | Admitting: Anesthesiology

## 2012-01-28 ENCOUNTER — Encounter (HOSPITAL_COMMUNITY): Payer: Self-pay | Admitting: *Deleted

## 2012-01-28 ENCOUNTER — Inpatient Hospital Stay (HOSPITAL_COMMUNITY)
Admission: RE | Admit: 2012-01-28 | Discharge: 2012-01-31 | DRG: 370 | Disposition: A | Discharge status: Home / Self Care | Payer: BC Managed Care – PPO | Source: Ambulatory Visit | Attending: Obstetrics and Gynecology | Admitting: Obstetrics and Gynecology

## 2012-01-28 ENCOUNTER — Encounter (HOSPITAL_COMMUNITY)
Admission: RE | Disposition: A | Discharge status: Home / Self Care | Payer: Self-pay | Source: Ambulatory Visit | Attending: Obstetrics and Gynecology

## 2012-01-28 DIAGNOSIS — O09529 Supervision of elderly multigravida, unspecified trimester: Secondary | ICD-10-CM | POA: Diagnosis present

## 2012-01-28 DIAGNOSIS — D249 Benign neoplasm of unspecified breast: Secondary | ICD-10-CM

## 2012-01-28 DIAGNOSIS — O1002 Pre-existing essential hypertension complicating childbirth: Secondary | ICD-10-CM | POA: Diagnosis present

## 2012-01-28 DIAGNOSIS — Z98891 History of uterine scar from previous surgery: Secondary | ICD-10-CM | POA: Diagnosis not present

## 2012-01-28 DIAGNOSIS — B9689 Other specified bacterial agents as the cause of diseases classified elsewhere: Secondary | ICD-10-CM

## 2012-01-28 DIAGNOSIS — O9902 Anemia complicating childbirth: Secondary | ICD-10-CM | POA: Diagnosis present

## 2012-01-28 DIAGNOSIS — O34219 Maternal care for unspecified type scar from previous cesarean delivery: Principal | ICD-10-CM | POA: Diagnosis present

## 2012-01-28 DIAGNOSIS — D649 Anemia, unspecified: Secondary | ICD-10-CM

## 2012-01-28 DIAGNOSIS — N80559 Endometriosis of other parts of the colon, unspecified depth: Secondary | ICD-10-CM

## 2012-01-28 DIAGNOSIS — N805 Endometriosis of intestine: Secondary | ICD-10-CM

## 2012-01-28 DIAGNOSIS — N760 Acute vaginitis: Secondary | ICD-10-CM

## 2012-01-28 DIAGNOSIS — I10 Essential (primary) hypertension: Secondary | ICD-10-CM

## 2012-01-28 DIAGNOSIS — Z880 Allergy status to penicillin: Secondary | ICD-10-CM

## 2012-01-28 DIAGNOSIS — N736 Female pelvic peritoneal adhesions (postinfective): Secondary | ICD-10-CM

## 2012-01-28 DIAGNOSIS — D259 Leiomyoma of uterus, unspecified: Secondary | ICD-10-CM

## 2012-01-28 LAB — RPR: RPR Ser Ql: NONREACTIVE

## 2012-01-28 SURGERY — Surgical Case
Anesthesia: Spinal | Site: Abdomen | Laterality: Bilateral | Wound class: Clean Contaminated

## 2012-01-28 MED ORDER — LACTATED RINGERS IV SOLN
INTRAVENOUS | Status: DC | PRN
  Administered 2012-01-28: 11:00:00 via INTRAVENOUS

## 2012-01-28 MED ORDER — DIPHENHYDRAMINE HCL 25 MG PO CAPS: 25.0000 mg | ORAL_CAPSULE | Freq: Four times a day (QID) | ORAL | Status: DC | PRN

## 2012-01-28 MED ORDER — SIMETHICONE 80 MG PO CHEW
80.0000 mg | CHEWABLE_TABLET | ORAL | Status: DC | PRN
Start: 2012-01-28 — End: 2012-01-31

## 2012-01-28 MED ORDER — TETANUS-DIPHTH-ACELL PERTUSSIS 5-2.5-18.5 LF-MCG/0.5 IM SUSP
0.5000 mL | Freq: Once | INTRAMUSCULAR | Status: AC
  Administered 2012-01-30: 0.5 mL via INTRAMUSCULAR
  Filled 2012-01-28: qty 0.5

## 2012-01-28 MED ORDER — ONDANSETRON HCL 4 MG/2ML IJ SOLN
INTRAMUSCULAR | Status: AC
  Filled 2012-01-28: qty 2

## 2012-01-28 MED ORDER — PRENATAL MULTIVITAMIN CH
1.0000 | ORAL_TABLET | Freq: Every day | ORAL | Status: DC
  Administered 2012-01-28 – 2012-01-31 (×4): 1 via ORAL
  Filled 2012-01-28 (×4): qty 1

## 2012-01-28 MED ORDER — FENTANYL CITRATE 0.05 MG/ML IJ SOLN: 25.0000 ug | INTRAMUSCULAR | Status: DC | PRN

## 2012-01-28 MED ORDER — NALOXONE HCL 0.4 MG/ML IJ SOLN: 0.4000 mg | INTRAMUSCULAR | Status: DC | PRN

## 2012-01-28 MED ORDER — SODIUM CHLORIDE 0.9 % IJ SOLN: 3.0000 mL | INTRAMUSCULAR | Status: DC | PRN

## 2012-01-28 MED ORDER — FERROUS SULFATE 325 (65 FE) MG PO TABS: 325.0000 mg | ORAL_TABLET | Freq: Two times a day (BID) | ORAL | Status: DC

## 2012-01-28 MED ORDER — BUPIVACAINE HCL (PF) 0.25 % IJ SOLN
INTRAMUSCULAR | Status: AC
  Filled 2012-01-28: qty 30

## 2012-01-28 MED ORDER — ONDANSETRON HCL 4 MG PO TABS: 4.0000 mg | ORAL_TABLET | ORAL | Status: DC | PRN

## 2012-01-28 MED ORDER — MORPHINE SULFATE (PF) 0.5 MG/ML IJ SOLN
INTRAMUSCULAR | Status: DC | PRN
  Administered 2012-01-28: .15 mg via INTRATHECAL

## 2012-01-28 MED ORDER — KETOROLAC TROMETHAMINE 30 MG/ML IJ SOLN: 15.0000 mg | Freq: Once | INTRAMUSCULAR | Status: DC | PRN

## 2012-01-28 MED ORDER — OXYTOCIN 10 UNIT/ML IJ SOLN
INTRAMUSCULAR | Status: AC
  Filled 2012-01-28: qty 4

## 2012-01-28 MED ORDER — SIMETHICONE 80 MG PO CHEW
80.0000 mg | CHEWABLE_TABLET | Freq: Three times a day (TID) | ORAL | Status: DC
  Administered 2012-01-28 – 2012-01-31 (×11): 80 mg via ORAL

## 2012-01-28 MED ORDER — ONDANSETRON HCL 4 MG/2ML IJ SOLN
INTRAMUSCULAR | Status: DC | PRN
  Administered 2012-01-28: 4 mg via INTRAVENOUS

## 2012-01-28 MED ORDER — NIFEDIPINE ER 30 MG PO TB24
30.0000 mg | ORAL_TABLET | Freq: Every day | ORAL | Status: DC
  Administered 2012-01-29 – 2012-01-31 (×3): 30 mg via ORAL
  Filled 2012-01-28 (×3): qty 1

## 2012-01-28 MED ORDER — SCOPOLAMINE 1 MG/3DAYS TD PT72
1.0000 | MEDICATED_PATCH | Freq: Once | TRANSDERMAL | Status: DC
  Administered 2012-01-28: 1.5 mg via TRANSDERMAL

## 2012-01-28 MED ORDER — DIBUCAINE 1 % RE OINT: 1.0000 "application " | TOPICAL_OINTMENT | RECTAL | Status: DC | PRN

## 2012-01-28 MED ORDER — ONDANSETRON HCL 4 MG/2ML IJ SOLN: 4.0000 mg | Freq: Once | INTRAMUSCULAR | Status: DC | PRN

## 2012-01-28 MED ORDER — ONDANSETRON HCL 4 MG/2ML IJ SOLN: 4.0000 mg | INTRAMUSCULAR | Status: DC | PRN

## 2012-01-28 MED ORDER — MENTHOL 3 MG MT LOZG: 1.0000 | LOZENGE | OROMUCOSAL | Status: DC | PRN

## 2012-01-28 MED ORDER — DIPHENHYDRAMINE HCL 50 MG/ML IJ SOLN: 25.0000 mg | INTRAMUSCULAR | Status: DC | PRN

## 2012-01-28 MED ORDER — FENTANYL CITRATE 0.05 MG/ML IJ SOLN
INTRAMUSCULAR | Status: AC
  Filled 2012-01-28: qty 2

## 2012-01-28 MED ORDER — LACTATED RINGERS IV SOLN
INTRAVENOUS | Status: DC
  Administered 2012-01-28 (×2): via INTRAVENOUS

## 2012-01-28 MED ORDER — ZOLPIDEM TARTRATE 5 MG PO TABS: 5.0000 mg | ORAL_TABLET | Freq: Every evening | ORAL | Status: DC | PRN

## 2012-01-28 MED ORDER — LACTATED RINGERS IV SOLN
INTRAVENOUS | Status: DC
  Administered 2012-01-28: 23:00:00 via INTRAVENOUS

## 2012-01-28 MED ORDER — MEPERIDINE HCL 25 MG/ML IJ SOLN: 6.2500 mg | INTRAMUSCULAR | Status: DC | PRN

## 2012-01-28 MED ORDER — SENNOSIDES-DOCUSATE SODIUM 8.6-50 MG PO TABS
2.0000 | ORAL_TABLET | Freq: Every day | ORAL | Status: DC
  Administered 2012-01-28 – 2012-01-30 (×3): 2 via ORAL

## 2012-01-28 MED ORDER — BUPIVACAINE IN DEXTROSE 0.75-8.25 % IT SOLN
INTRATHECAL | Status: DC | PRN
  Administered 2012-01-28: 1.6 mL via INTRATHECAL

## 2012-01-28 MED ORDER — NALBUPHINE SYRINGE 5 MG/0.5 ML
5.0000 mg | INJECTION | INTRAMUSCULAR | Status: DC | PRN
  Filled 2012-01-28: qty 1

## 2012-01-28 MED ORDER — ONDANSETRON HCL 4 MG/2ML IJ SOLN: 4.0000 mg | Freq: Three times a day (TID) | INTRAMUSCULAR | Status: DC | PRN

## 2012-01-28 MED ORDER — FENTANYL CITRATE 0.05 MG/ML IJ SOLN
INTRAMUSCULAR | Status: DC | PRN
  Administered 2012-01-28: 25 ug via INTRATHECAL

## 2012-01-28 MED ORDER — OXYTOCIN 40 UNITS IN LACTATED RINGERS INFUSION - SIMPLE MED
62.5000 mL/h | INTRAVENOUS | Status: AC
  Administered 2012-01-28: 62.5 mL/h via INTRAVENOUS
  Filled 2012-01-28: qty 1000

## 2012-01-28 MED ORDER — LACTATED RINGERS IV SOLN
Freq: Once | INTRAVENOUS | Status: AC
  Administered 2012-01-28: 09:00:00 via INTRAVENOUS

## 2012-01-28 MED ORDER — IBUPROFEN 600 MG PO TABS
600.0000 mg | ORAL_TABLET | Freq: Four times a day (QID) | ORAL | Status: DC
  Administered 2012-01-28 – 2012-01-31 (×11): 600 mg via ORAL
  Filled 2012-01-28 (×11): qty 1

## 2012-01-28 MED ORDER — MORPHINE SULFATE 0.5 MG/ML IJ SOLN
INTRAMUSCULAR | Status: AC
  Filled 2012-01-28: qty 10

## 2012-01-28 MED ORDER — PHENYLEPHRINE 40 MCG/ML (10ML) SYRINGE FOR IV PUSH (FOR BLOOD PRESSURE SUPPORT)
PREFILLED_SYRINGE | INTRAVENOUS | Status: AC
  Filled 2012-01-28: qty 20

## 2012-01-28 MED ORDER — DIPHENHYDRAMINE HCL 50 MG/ML IJ SOLN: 12.5000 mg | INTRAMUSCULAR | Status: DC | PRN

## 2012-01-28 MED ORDER — DIPHENHYDRAMINE HCL 25 MG PO CAPS: 25.0000 mg | ORAL_CAPSULE | ORAL | Status: DC | PRN

## 2012-01-28 MED ORDER — NALOXONE HCL 1 MG/ML IJ SOLN
1.0000 ug/kg/h | INTRAVENOUS | Status: DC | PRN
  Filled 2012-01-28: qty 2

## 2012-01-28 MED ORDER — KETOROLAC TROMETHAMINE 60 MG/2ML IM SOLN
INTRAMUSCULAR | Status: AC
  Administered 2012-01-28: 60 mg via INTRAMUSCULAR
  Filled 2012-01-28: qty 2

## 2012-01-28 MED ORDER — OXYCODONE-ACETAMINOPHEN 5-325 MG PO TABS
1.0000 | ORAL_TABLET | ORAL | Status: DC | PRN
  Administered 2012-01-29 (×2): 1 via ORAL
  Administered 2012-01-29: 2 via ORAL
  Administered 2012-01-30 (×4): 1 via ORAL
  Administered 2012-01-31: 2 via ORAL
  Filled 2012-01-28 (×4): qty 1
  Filled 2012-01-28: qty 2
  Filled 2012-01-28: qty 1
  Filled 2012-01-28: qty 2
  Filled 2012-01-28: qty 1

## 2012-01-28 MED ORDER — SCOPOLAMINE 1 MG/3DAYS TD PT72
MEDICATED_PATCH | TRANSDERMAL | Status: AC
  Administered 2012-01-28: 1.5 mg via TRANSDERMAL
  Filled 2012-01-28: qty 1

## 2012-01-28 MED ORDER — WITCH HAZEL-GLYCERIN EX PADS: 1.0000 "application " | MEDICATED_PAD | CUTANEOUS | Status: DC | PRN

## 2012-01-28 MED ORDER — METOCLOPRAMIDE HCL 5 MG/ML IJ SOLN: 10.0000 mg | Freq: Three times a day (TID) | INTRAMUSCULAR | Status: DC | PRN

## 2012-01-28 MED ORDER — METHYLERGONOVINE MALEATE 0.2 MG/ML IJ SOLN: 0.2000 mg | INTRAMUSCULAR | Status: DC | PRN

## 2012-01-28 MED ORDER — POLYSACCHARIDE IRON COMPLEX 150 MG PO CAPS
150.0000 mg | ORAL_CAPSULE | Freq: Two times a day (BID) | ORAL | Status: DC
  Administered 2012-01-28 – 2012-01-31 (×6): 150 mg via ORAL
  Filled 2012-01-28 (×7): qty 1

## 2012-01-28 MED ORDER — MEASLES, MUMPS & RUBELLA VAC ~~LOC~~ INJ
0.5000 mL | INJECTION | Freq: Once | SUBCUTANEOUS | Status: DC
  Filled 2012-01-28: qty 0.5

## 2012-01-28 MED ORDER — KETOROLAC TROMETHAMINE 30 MG/ML IJ SOLN: 30.0000 mg | Freq: Four times a day (QID) | INTRAMUSCULAR | Status: AC | PRN

## 2012-01-28 MED ORDER — METHYLERGONOVINE MALEATE 0.2 MG PO TABS: 0.2000 mg | ORAL_TABLET | ORAL | Status: DC | PRN

## 2012-01-28 MED ORDER — PHENYLEPHRINE HCL 10 MG/ML IJ SOLN
INTRAMUSCULAR | Status: DC | PRN
  Administered 2012-01-28 (×2): 80 ug via INTRAVENOUS
  Administered 2012-01-28: 120 ug via INTRAVENOUS
  Administered 2012-01-28 (×6): 80 ug via INTRAVENOUS

## 2012-01-28 MED ORDER — KETOROLAC TROMETHAMINE 60 MG/2ML IM SOLN
60.0000 mg | Freq: Once | INTRAMUSCULAR | Status: AC | PRN
  Administered 2012-01-28: 60 mg via INTRAMUSCULAR

## 2012-01-28 MED ORDER — LANOLIN HYDROUS EX OINT: 1.0000 "application " | TOPICAL_OINTMENT | CUTANEOUS | Status: DC | PRN

## 2012-01-28 MED ORDER — OXYTOCIN 10 UNIT/ML IJ SOLN
40.0000 [IU] | INTRAVENOUS | Status: DC | PRN
  Administered 2012-01-28: 40 [IU] via INTRAVENOUS

## 2012-01-28 MED ORDER — BUPIVACAINE HCL (PF) 0.25 % IJ SOLN
INTRAMUSCULAR | Status: DC | PRN
  Administered 2012-01-28: 20 mL

## 2012-01-28 SURGICAL SUPPLY — 40 items
APL SKNCLS STERI-STRIP NONHPOA (GAUZE/BANDAGES/DRESSINGS) ×1
BENZOIN TINCTURE PRP APPL 2/3 (GAUZE/BANDAGES/DRESSINGS) ×2 IMPLANT
BOOTIES KNEE HIGH SLOAN (MISCELLANEOUS) ×4 IMPLANT
CLOTH BEACON ORANGE TIMEOUT ST (SAFETY) ×2 IMPLANT
DRAIN JACKSON PRT FLT 10 (DRAIN) IMPLANT
DRAPE LG THREE QUARTER DISP (DRAPES) ×2 IMPLANT
DRESSING TELFA 8X3 (GAUZE/BANDAGES/DRESSINGS) ×1 IMPLANT
DRSG OPSITE POSTOP 4X10 (GAUZE/BANDAGES/DRESSINGS) ×1 IMPLANT
DURAPREP 26ML APPLICATOR (WOUND CARE) ×2 IMPLANT
ELECT REM PT RETURN 9FT ADLT (ELECTROSURGICAL) ×2
ELECTRODE REM PT RTRN 9FT ADLT (ELECTROSURGICAL) ×1 IMPLANT
EVACUATOR SILICONE 100CC (DRAIN) IMPLANT
EXTRACTOR VACUUM M CUP 4 TUBE (SUCTIONS) IMPLANT
GAUZE SPONGE 4X4 12PLY STRL LF (GAUZE/BANDAGES/DRESSINGS) ×3 IMPLANT
GLOVE BIOGEL PI IND STRL 7.0 (GLOVE) ×2 IMPLANT
GLOVE BIOGEL PI INDICATOR 7.0 (GLOVE) ×2
GLOVE ECLIPSE 6.5 STRL STRAW (GLOVE) ×2 IMPLANT
GOWN PREVENTION PLUS LG XLONG (DISPOSABLE) ×6 IMPLANT
KIT ABG SYR 3ML LUER SLIP (SYRINGE) IMPLANT
NDL HYPO 25X5/8 SAFETYGLIDE (NEEDLE) IMPLANT
NEEDLE HYPO 22GX1.5 SAFETY (NEEDLE) ×2 IMPLANT
NEEDLE HYPO 25X5/8 SAFETYGLIDE (NEEDLE) IMPLANT
NS IRRIG 1000ML POUR BTL (IV SOLUTION) ×4 IMPLANT
PACK C SECTION WH (CUSTOM PROCEDURE TRAY) ×2 IMPLANT
PAD ABD 7.5X8 STRL (GAUZE/BANDAGES/DRESSINGS) ×1 IMPLANT
PAD OB MATERNITY 4.3X12.25 (PERSONAL CARE ITEMS) ×1 IMPLANT
RTRCTR C-SECT PINK 25CM LRG (MISCELLANEOUS) ×2 IMPLANT
SLEEVE SCD COMPRESS KNEE MED (MISCELLANEOUS) IMPLANT
STRIP CLOSURE SKIN 1/2X4 (GAUZE/BANDAGES/DRESSINGS) ×2 IMPLANT
SUT CHROMIC GUT AB #0 18 (SUTURE) IMPLANT
SUT MNCRL AB 3-0 PS2 27 (SUTURE) ×2 IMPLANT
SUT SILK 2 0 FSL 18 (SUTURE) IMPLANT
SUT VIC AB 0 CTX 36 (SUTURE) ×10
SUT VIC AB 0 CTX36XBRD ANBCTRL (SUTURE) ×2 IMPLANT
SUT VIC AB 1 CT1 36 (SUTURE) ×4 IMPLANT
SYR 20CC LL (SYRINGE) ×2 IMPLANT
TAPE PAPER 2X10 WHT MICROPORE (GAUZE/BANDAGES/DRESSINGS) ×1 IMPLANT
TOWEL OR 17X24 6PK STRL BLUE (TOWEL DISPOSABLE) ×6 IMPLANT
TRAY FOLEY CATH 14FR (SET/KITS/TRAYS/PACK) ×2 IMPLANT
WATER STERILE IRR 1000ML POUR (IV SOLUTION) ×1 IMPLANT

## 2012-01-28 NOTE — Transfer of Care (Signed)
Immediate Anesthesia Transfer of Care Note  Patient: Terry Bryant  Procedure(s) Performed: Procedure(s) (LRB) with comments: CESAREAN SECTION WITH BILATERAL TUBAL LIGATION (Bilateral) - Repeat C/S - POSSIBLE BTL  Patient Location: PACU  Anesthesia Type:Spinal and Epidural  Level of Consciousness: awake and alert   Airway & Oxygen Therapy: Patient Spontanous Breathing  Post-op Assessment: Report given to PACU RN and Post -op Vital signs reviewed and stable  Post vital signs: stable  Complications: No apparent anesthesia complications

## 2012-01-28 NOTE — Consult Note (Signed)
Neonatology Note:  Attendance at C-section:  I was asked to attend this repeat C/S at term. The mother is a G2P1 O pos, GBS neg with hypertension (on Procardia) and fibroids. ROM at delivery, fluid clear. CAN times 2 loosely. Infant vigorous with good spontaneous cry and tone. Needed only minimal bulb suctioning. Ap 9/9. Lungs clear to ausc in DR. To CN to care of Pediatrician.  Ilyas Lipsitz, MD  

## 2012-01-28 NOTE — Op Note (Signed)
Preoperative diagnosis: Intrauterine pregnancy at 39 weeks, previous cesarean section, s/p LSO for TOA  Post operative diagnosis: Same  Anesthesia: Spinal  Anesthesiologist: Dr. Malen Gauze  Procedure: Repeat  low transverse cesarean section  Surgeon: Dr. Dois Davenport Jahna Liebert  Assistant: Nigel Bridgeman CNM  Estimated blood loss: 900 cc  Procedure:  After being informed of the planned procedure and possible complications including bleeding, infection, injury to other organs, informed consent is obtained. The patient is taken to OR #9 and given spinal anesthesia without complication. She is placed in the dorsal decubitus position with the pelvis tilted to the left. She is then prepped and draped in a sterile fashion. A Foley catheter is inserted in her bladder.  After assessing adequate level of anesthesia, we infiltrate the suprapubic area with 20 cc of Marcaine 0.25 and perform a Pfannenstiel incision which is brought down sharply to the fascia. The fascia is entered in a low transverse fashion. Linea alba is dissected. Peritoneum is entered in a midline fashion. An Alexis retractor is easily positioned.   The myometrium is then entered in a low transverse fashion, 2 cm above the vesico-uterine junction ; first with knife and then extended bluntly. Amniotic fluid is clear. We assist the birth of a female  infant in vertex presentation. Mouth and nose are suctioned. The baby is delivered. The cord is clamped and sectioned. The baby is given to the neonatologist present in the room.  10 cc of blood is drawn from the umbilical vein.The placenta is allowed to deliver spontaneously. It is complete and the cord has 3 vessels and a true knot. Uterine revision is negative.  We proceed with closure of the myometrium in 2 layers: First with a running locked suture of 0 Vicryl, then with a Lembert suture of 0 Vicryl imbricating the first one. Hemostasis is completed with cauterization on peritoneal edges and 4  figure-of-eight stitches of 0-Vicryl.  Both paracolic gutters are cleaned. Left tube and ovary are surgically absent. Right tube and ovary are involved in a process of Grade 1 and 2 adhesions. We proceed with lysis of adhesion to restore tubal and ovarian mobility. The pelvis is profusely irrigated with warm saline to confirm a satisfactory hemostasis.  Retractors and sponges are removed. Under fascia hemostasis is completed with cauterization. The fascia is then closed with 2 running sutures of 0 Vicryl meeting midline. The wound is irrigated with warm saline and hemostasis is completed with cauterization. The skin is closed with a subcuticular suture of 3-0 Monocryl and Steri-Strips.  Instrument and sponge count is complete x2. Estimated blood loss is 900 cc.  The procedure is well tolerated by the patient who is taken to recovery room in a well and stable condition.  female baby named [name] was born at 10:42 and received an Apgar of 9  at 1 minute and 9 at 5 minutes.    Specimen: Placenta sent to pathology   [facility] A MD 12/16/201311:33 AM

## 2012-01-28 NOTE — Anesthesia Postprocedure Evaluation (Signed)
  Anesthesia Post-op Note  Patient: Terry Bryant  Procedure(s) Performed: Procedure(s) (LRB) with comments: CESAREAN SECTION WITH BILATERAL TUBAL LIGATION (Bilateral) - Repeat C/S - POSSIBLE BTL  Patient Location: Mother/Baby  Anesthesia Type:Spinal  Level of Consciousness: awake, alert  and oriented  Airway and Oxygen Therapy: Patient Spontanous Breathing  Post-op Pain: mild  Post-op Assessment: Post-op Vital signs reviewed, Patient's Cardiovascular Status Stable, No headache, No backache, No residual numbness and No residual motor weakness  Post-op Vital Signs: Reviewed and stable  Complications: No apparent anesthesia complications

## 2012-01-28 NOTE — Anesthesia Procedure Notes (Signed)
Spinal  Patient location during procedure: OR Start time: 01/28/2012 10:12 AM Staffing Anesthesiologist: Nurah Petrides A. Performed by: anesthesiologist  Preanesthetic Checklist Completed: patient identified, site marked, surgical consent, pre-op evaluation, timeout performed, IV checked, risks and benefits discussed and monitors and equipment checked Spinal Block Patient position: sitting Prep: site prepped and draped and DuraPrep Patient monitoring: cardiac monitor, continuous pulse ox, blood pressure and heart rate Approach: midline Location: L3-4 Injection technique: catheter Needle Needle type: Tuohy and Sprotte  Needle gauge: 24 G Needle length: 12.7 cm Catheter type: closed end flexible Catheter size: 19 g Assessment Sensory level: T4 Additional Notes Patient tolerated procedure well. Adequate sensory level.

## 2012-01-28 NOTE — Anesthesia Postprocedure Evaluation (Signed)
Anesthesia Post Note  Patient: Terry Bryant  Procedure(s) Performed: Procedure(s) (LRB): CESAREAN SECTION WITH BILATERAL TUBAL LIGATION (Bilateral)  Anesthesia type: Spinal  Patient location: PACU  Post pain: Pain level controlled  Post assessment: Post-op Vital signs reviewed  Last Vitals:  Filed Vitals:   01/28/12 1145  BP: 108/65  Pulse: 87  Temp:   Resp: 16    Post vital signs: Reviewed  Level of consciousness: awake  Complications: No apparent anesthesia complications

## 2012-01-28 NOTE — Addendum Note (Signed)
Addendum  created 01/28/12 2202 by Graciela Husbands, CRNA   Modules edited:Notes Section

## 2012-01-28 NOTE — Anesthesia Preprocedure Evaluation (Addendum)
Anesthesia Evaluation  Patient identified by MRN, date of birth, ID band Patient awake    Reviewed: Allergy & Precautions, H&P , NPO status , Patient's Chart, lab work & pertinent test results  Airway Mallampati: II TM Distance: >3 FB Neck ROM: full    Dental No notable dental hx.    Pulmonary    Pulmonary exam normal       Cardiovascular hypertension, Pt. on medications Rhythm:regular     Neuro/Psych negative neurological ROS  negative psych ROS   GI/Hepatic negative GI ROS, Neg liver ROS,   Endo/Other  negative endocrine ROS  Renal/GU negative Renal ROS  negative genitourinary   Musculoskeletal   Abdominal Normal abdominal exam  (+)   Peds negative pediatric ROS (+)  Hematology  (+) Blood dyscrasia, anemia , Hx/o eczema   Anesthesia Other Findings   Reproductive/Obstetrics (+) Pregnancy                          Anesthesia Physical Anesthesia Plan  ASA: II  Anesthesia Plan: Spinal and Combined Spinal and Epidural   Post-op Pain Management:    Induction:   Airway Management Planned: Natural Airway  Additional Equipment:   Intra-op Plan:   Post-operative Plan:   Informed Consent: I have reviewed the patients History and Physical, chart, labs and discussed the procedure including the risks, benefits and alternatives for the proposed anesthesia with the patient or authorized representative who has indicated his/her understanding and acceptance.     Plan Discussed with: CRNA, Surgeon and Anesthesiologist  Anesthesia Plan Comments:        Anesthesia Quick Evaluation

## 2012-01-29 ENCOUNTER — Encounter (HOSPITAL_COMMUNITY): Payer: Self-pay | Admitting: Obstetrics and Gynecology

## 2012-01-29 DIAGNOSIS — Z98891 History of uterine scar from previous surgery: Secondary | ICD-10-CM

## 2012-01-29 HISTORY — DX: History of uterine scar from previous surgery: Z98.891

## 2012-01-29 LAB — CBC
Hemoglobin: 8.6 g/dL — ABNORMAL LOW (ref 12.0–15.0)
MCH: 25.3 pg — ABNORMAL LOW (ref 26.0–34.0)
Platelets: 144 10*3/uL — ABNORMAL LOW (ref 150–400)
RBC: 3.4 MIL/uL — ABNORMAL LOW (ref 3.87–5.11)
WBC: 9.8 10*3/uL (ref 4.0–10.5)

## 2012-01-29 NOTE — Progress Notes (Addendum)
  Subjective: Postpartum Day 1: Cesarean Delivery due to repeat Patient up ad lib, reports no syncope or dizziness.  Taking Motrin for pain--plans to ask for Percocet after breakfast Feeding:  Breast Contraceptive plan:  IUD  Objective: Vital signs in last 24 hours: Temp:  [97.2 F (36.2 C)-98.4 F (36.9 C)] 98 F (36.7 C) (12/17 0640) Pulse Rate:  [69-98] 85  (12/17 0640) Resp:  [16-20] 18  (12/17 0640) BP: (94-133)/(56-88) 109/74 mmHg (12/17 0640) SpO2:  [97 %-100 %] 98 % (12/17 0640) Weight:  [189 lb (85.73 kg)] 189 lb (85.73 kg) (12/16 0806)  Physical Exam:  General: alert Lochia: appropriate Uterine Fundus: firm Incision: Dressing CDI DVT Evaluation: No evidence of DVT seen on physical exam. Negative Homan's sign. JP drain:   NA   Basename 01/29/12 0538  HGB 8.6*  HCT 27.4*  Pre-delivery Hgb 11  Assessment/Plan: Status post Cesarean section day 1 Anemia without hemodynamic instability  Continue current care. Orthostatics Niferex already ordered BID    Norene Oliveri 01/29/2012, 7:57 AM

## 2012-01-30 NOTE — Progress Notes (Signed)
Subjective: Postpartum Day 2: Cesarean Delivery secondary to: scheduled repeat cesarean  Patient reports tolerating PO, + flatus and no problems voiding.   no complaints, up ad lib without syncope Pain well controlled with po meds, using motrin and percocet  Breast and bottle feeding  Mood stable, bonding well Contraception: plans mirena   Objective: Vital signs in last 24 hours: Temp:  [98.1 F (36.7 C)-98.7 F (37.1 C)] 98.7 F (37.1 C) (12/18 0536) Pulse Rate:  [99-109] 109  (12/18 0915) Resp:  [18] 18  (12/18 0536) BP: (105-113)/(68-78) 113/78 mmHg (12/18 0915)  Physical Exam:  General: alert and no distress Heart: RRR Lungs: CTAB Abdomen: BS x4 Uterine Fundus: firm Incision: covered w "honeycomb" dressing, on lower middle edge, <1cm area of drainage noted   Lochia: appropriate DVT Evaluation: No evidence of DVT seen on physical exam. Negative Homan's sign. No significant calf/ankle edema.   Basename 01/29/12 0538  HGB 8.6*  HCT 27.4*    Assessment/Plan: Status post Cesarean section. Doing well postoperatively.  Severe postpartum anemia, asymptomatic  CHTN on procardia, BP stable  FE supplement started yesterday Continue current care. Plan discharge tmrw Pt plans mirena for contraception    Terry Bryant M 01/30/2012, 2:45 PM

## 2012-01-31 LAB — TYPE AND SCREEN
ABO/RH(D): O POS
Antibody Screen: NEGATIVE
Unit division: 0

## 2012-01-31 MED ORDER — PROMETHAZINE HCL 12.5 MG PO TABS: 12.5000 mg | ORAL_TABLET | Freq: Four times a day (QID) | ORAL | Status: DC | PRN

## 2012-01-31 MED ORDER — OXYCODONE-ACETAMINOPHEN 5-325 MG PO TABS: 1.0000 | ORAL_TABLET | ORAL | Status: DC | PRN

## 2012-01-31 MED ORDER — IBUPROFEN 800 MG PO TABS: 800.0000 mg | ORAL_TABLET | Freq: Three times a day (TID) | ORAL | Status: DC | PRN

## 2012-01-31 MED ORDER — FERROUS SULFATE 325 (65 FE) MG PO TABS: 325.0000 mg | ORAL_TABLET | Freq: Three times a day (TID) | ORAL | Status: DC

## 2012-01-31 NOTE — Discharge Summary (Addendum)
Cesarean Section Delivery Discharge Summary  Terry Bryant  DOB:    [birth year] MRN:    [id number] CSN:    [id number]  Date of admission:                  01/28/2012  Date of discharge:                   01/31/2012  Procedures this admission:  01/28/2012  Repeat low transverse cesarean section  Newborn Data:  Live born female  Birth Weight: 7 lb 6.7 oz (3365 g) APGAR: 9, 9  Home with mother. Name: Terry Bryant  History of Present Illness:  Ms. ARIAUNA FARABEE is a 36 y.o. female, G2P2002, who presents at [redacted]w[redacted]d weeks gestation. The patient has been followed at the [facility] and Gynecology division of [facility].    Her pregnancy has been complicated by:  Patient Active Problem List  Diagnosis  . Endometriosis of colon  . Pelvic adhesions  . H/O cesarean section  . Anemia  . Hypertension  . Bacterial vaginosis  . Fibroid uterus  . Intraductal papilloma of breast  . Penicillin allergy  . Status post repeat low transverse cesarean section   none.  Hospital course:  The patient was admitted for elective repeat cesarean section.   Her postpartum course was not complicated.  She was found to have anemia and was not orthostatic.  She was discharged to home on postpartum day 3 doing well.  Feeding:  breast  Contraception:  Mirena IUD  Discharge hemoglobin:  Hemoglobin  Date Value Range Status  01/29/2012 8.6* 12.0 - 15.0 g/dL Final     HCT  Date Value Range Status  01/29/2012 27.4* 36.0 - 46.0 % Final    Discharge Physical Exam:   General: alert and no distress Lochia: appropriate Uterine Fundus: firm Incision: Dressing clean and dry  DVT Evaluation: No evidence of DVT seen on physical exam.  Intrapartum Procedures: cesarean: low cervical, transverse Postpartum Procedures: none Complications-Operative and Postpartum: none  Discharge Diagnoses: Term Pregnancy-delivered and Prior cesarean section, anemia  ,hypertension  Discharge Information:  Activity:           pelvic rest Diet:                routine Medications: PNV, Ibuprofen, Colace, Iron, Percocet and Phenergan Condition:      stable and improved Instructions:  See computer-generated information Discharge to: home     Ltanya Bayley V 01/31/2012

## 2012-02-14 DIAGNOSIS — Z331 Pregnant state, incidental: Secondary | ICD-10-CM

## 2012-02-29 ENCOUNTER — Telehealth: Payer: Self-pay | Admitting: Obstetrics and Gynecology

## 2012-02-29 ENCOUNTER — Ambulatory Visit: Payer: BC Managed Care – PPO | Admitting: Obstetrics and Gynecology

## 2012-02-29 ENCOUNTER — Encounter: Payer: Self-pay | Admitting: Obstetrics and Gynecology

## 2012-02-29 VITALS — BP 112/78 | Temp 97.8°F | Wt 166.0 lb

## 2012-02-29 DIAGNOSIS — R51 Headache: Secondary | ICD-10-CM

## 2012-02-29 DIAGNOSIS — R609 Edema, unspecified: Secondary | ICD-10-CM

## 2012-02-29 LAB — COMPREHENSIVE METABOLIC PANEL
ALT: 9 U/L (ref 0–35)
Albumin: 4 g/dL (ref 3.5–5.2)
CO2: 29 mEq/L (ref 19–32)
Calcium: 9.7 mg/dL (ref 8.4–10.5)
Chloride: 100 mEq/L (ref 96–112)
Glucose, Bld: 97 mg/dL (ref 70–99)
Potassium: 3.6 mEq/L (ref 3.5–5.3)
Sodium: 139 mEq/L (ref 135–145)
Total Bilirubin: 0.3 mg/dL (ref 0.3–1.2)
Total Protein: 7.4 g/dL (ref 6.0–8.3)

## 2012-02-29 LAB — CBC
Hemoglobin: 11.7 g/dL — ABNORMAL LOW (ref 12.0–15.0)
MCH: 24.6 pg — ABNORMAL LOW (ref 26.0–34.0)
Platelets: 263 10*3/uL (ref 150–400)
RBC: 4.76 MIL/uL (ref 3.87–5.11)
WBC: 4.4 10*3/uL (ref 4.0–10.5)

## 2012-02-29 LAB — URIC ACID: Uric Acid, Serum: 4.3 mg/dL (ref 2.4–7.0)

## 2012-02-29 LAB — LACTATE DEHYDROGENASE: LDH: 185 U/L (ref 94–250)

## 2012-02-29 MED ORDER — BUTALBITAL-APAP-CAFFEINE 50-325-40 MG PO TABS: 1.0000 | ORAL_TABLET | Freq: Four times a day (QID) | ORAL | Status: DC | PRN

## 2012-02-29 NOTE — Progress Notes (Signed)
S: Pt c/o HA's the last week, no blurry visual disturbances, denies N/V/abdominal pain Pt c/o pedal edema, "was worse last night" Only drinks 4 cups of water Has taken motrin intermittently  O: GEN: NAD Lungs: CTAB Heart: RRR ABD: WNL Pelvic: deferred Ext: L foot, mild 1+ edema, non-pitting R foot normal Reflexes 2+ Neg clonus  A: 4wks s/p repeat c/s  P: increase PO fluids Motrin 600mg  q6h Tylenol 1000mg  q4-6hr fiorcet 1-2 tabs q6h PRN RTO 2wks or PRN worsening sx's  S.Tyria Springer, CNM

## 2012-02-29 NOTE — Progress Notes (Signed)
Pt is here today 4 weeks pp. C/o headaches and swelling no other complaints today.

## 2012-02-29 NOTE — Telephone Encounter (Signed)
Pt delivered vis C/S on  12/16 and is having swelling in her feet. Wants to know what she can do.

## 2012-03-10 ENCOUNTER — Ambulatory Visit: Payer: BC Managed Care – PPO | Admitting: Obstetrics and Gynecology

## 2012-03-10 ENCOUNTER — Encounter: Payer: Self-pay | Admitting: Obstetrics and Gynecology

## 2012-03-10 VITALS — BP 132/90 | Temp 98.0°F | Ht 65.0 in | Wt 168.0 lb

## 2012-03-10 DIAGNOSIS — Z139 Encounter for screening, unspecified: Secondary | ICD-10-CM

## 2012-03-10 NOTE — Progress Notes (Signed)
HISTORY OF PRESENT ILLNESS  [name] is a 37 y.o. year old female,G2P2002, who presents for a problem visit. The patient had a cesarean section by Dr. Silverio Lay on January 28, 2012.  She delivered a little boy named [name].  Subjective:  Doing well.  Objective:  BP 132/90  Temp 98 F (36.7 C)  Ht 5\' 5"  (1.651 m)  Wt 168 lb (76.204 kg)  BMI 27.96 kg/m2   General: no distress  Abdomen: Soft and nontender, incision well-healed  External genitalia: normal general appearance Vaginal: normal without tenderness, induration or masses Cervix: normal appearance Adnexa: normal bimanual exam Uterus: normal size shape and consistency  Assessment:  Doing well status post cesarean section  Desires Mirena IUD for contraception  Plan:  Gonorrhea and Chlamydia sent.  We'll schedule Mirena IUD insertion.  EPDS equals 4  Breast-feeding  Return to office in 2 week(s).   Leonard Schwartz M.D.  03/10/2012 11:08 AM    Date of delivery: 01/27/2013 Female Name: Terry Bryant Vaginal delivery:no Cesarean section:yes Tubal ligation:no GDM:no Breast Feeding:yes Bottle Feeding:yes Post-Partum Blues:no Abnormal pap:no Normal GU function: yes Normal GI function:yes Returning to work:no EPDS: Score- 4

## 2012-03-11 LAB — GC/CHLAMYDIA PROBE AMP
CT Probe RNA: NEGATIVE
GC Probe RNA: NEGATIVE

## 2012-03-24 ENCOUNTER — Other Ambulatory Visit: Payer: Self-pay

## 2012-03-27 ENCOUNTER — Encounter: Payer: BC Managed Care – PPO | Admitting: Obstetrics and Gynecology

## 2012-04-10 ENCOUNTER — Encounter: Payer: Self-pay | Admitting: Obstetrics and Gynecology

## 2012-04-10 ENCOUNTER — Ambulatory Visit: Payer: BC Managed Care – PPO | Admitting: Obstetrics and Gynecology

## 2012-04-10 VITALS — BP 118/78 | Ht 65.0 in | Wt 167.0 lb

## 2012-04-10 DIAGNOSIS — Z975 Presence of (intrauterine) contraceptive device: Secondary | ICD-10-CM

## 2012-04-10 DIAGNOSIS — Z30430 Encounter for insertion of intrauterine contraceptive device: Secondary | ICD-10-CM

## 2012-04-10 MED ORDER — LEVONORGESTREL 20 MCG/24HR IU IUD
INTRAUTERINE_SYSTEM | Freq: Once | INTRAUTERINE | Status: AC
  Administered 2012-04-10: 1 via INTRAUTERINE

## 2012-04-10 NOTE — Progress Notes (Signed)
PROCEDURE FOR MIRENA IUD INSERTION  Ms. Dorris PHOENICIA PIRIE is a 37 y.o. year old female,G2P2002, who presents for insertion of a Mirena IUD. Gonorrhea and Chlamydia cultures are Negative.  Subjective:  The patient denies unprotected intercourse.  Objective:  BP 118/78  Ht 5\' 5"  (1.651 m)  Wt 167 lb (75.751 kg)  BMI 27.79 kg/m2  Breastfeeding? Yes   Pregnancy test: Negative  Examination of the pelvis is normal.  No signs of infection.  Procedure:  The cervix and vagina are prepped with multiple layers of Betadine after hurricaine gel was placed on the cervix. A single-tooth tenaculum was placed on the anterior lip of the cervix.  The uterus sounds to 7 centimeters. The Mirena device was inserted per protocol without complications.  Hemostasis was adequate.  The IUD string was cut so that the patient can easily palpate it.  All instruments were removed.  The uterus was reexamined and was noted to be firm.  The patient tolerated her procedure well.   Assessment:  Contraceptive management Desires Mirena IUD  Plan:  Instructions given for care of her Mirena. The patient will call for pain or signs of infection.  Return to office in 6 week(s).   Leonard Schwartz M.D.  04/10/2012 1:31 PM

## 2012-04-10 NOTE — Addendum Note (Signed)
Addended by: Tim Lair on: 04/10/2012 01:54 PM   Modules accepted: Orders

## 2012-08-27 ENCOUNTER — Encounter (HOSPITAL_BASED_OUTPATIENT_CLINIC_OR_DEPARTMENT_OTHER): Payer: Self-pay

## 2012-08-27 ENCOUNTER — Emergency Department (HOSPITAL_BASED_OUTPATIENT_CLINIC_OR_DEPARTMENT_OTHER)
Admission: EM | Admit: 2012-08-27 | Discharge: 2012-08-27 | Disposition: A | Discharge status: Home / Self Care | Payer: BC Managed Care – PPO | Attending: Emergency Medicine | Admitting: Emergency Medicine

## 2012-08-27 DIAGNOSIS — Z880 Allergy status to penicillin: Secondary | ICD-10-CM | POA: Insufficient documentation

## 2012-08-27 DIAGNOSIS — Z8619 Personal history of other infectious and parasitic diseases: Secondary | ICD-10-CM | POA: Insufficient documentation

## 2012-08-27 DIAGNOSIS — Z79899 Other long term (current) drug therapy: Secondary | ICD-10-CM | POA: Insufficient documentation

## 2012-08-27 DIAGNOSIS — M2540 Effusion, unspecified joint: Secondary | ICD-10-CM | POA: Insufficient documentation

## 2012-08-27 DIAGNOSIS — Z8701 Personal history of pneumonia (recurrent): Secondary | ICD-10-CM | POA: Insufficient documentation

## 2012-08-27 DIAGNOSIS — R51 Headache: Secondary | ICD-10-CM | POA: Insufficient documentation

## 2012-08-27 DIAGNOSIS — Z8742 Personal history of other diseases of the female genital tract: Secondary | ICD-10-CM | POA: Insufficient documentation

## 2012-08-27 DIAGNOSIS — D649 Anemia, unspecified: Secondary | ICD-10-CM | POA: Insufficient documentation

## 2012-08-27 DIAGNOSIS — Z872 Personal history of diseases of the skin and subcutaneous tissue: Secondary | ICD-10-CM | POA: Insufficient documentation

## 2012-08-27 DIAGNOSIS — Z87898 Personal history of other specified conditions: Secondary | ICD-10-CM | POA: Insufficient documentation

## 2012-08-27 DIAGNOSIS — Z8751 Personal history of pre-term labor: Secondary | ICD-10-CM | POA: Insufficient documentation

## 2012-08-27 DIAGNOSIS — I10 Essential (primary) hypertension: Secondary | ICD-10-CM | POA: Insufficient documentation

## 2012-08-27 DIAGNOSIS — Z8719 Personal history of other diseases of the digestive system: Secondary | ICD-10-CM | POA: Insufficient documentation

## 2012-08-27 NOTE — ED Notes (Signed)
MD at bedside. 

## 2012-08-27 NOTE — ED Provider Notes (Signed)
History    CSN: [id number] Arrival date & time 08/27/12  4098  First MD Initiated Contact with Patient 08/27/12 2016     Chief Complaint  Patient presents with  . Headache   (Consider location/radiation/quality/duration/timing/severity/associated sxs/prior Treatment) Patient is a 37 y.o. female presenting with headaches. The history is provided by the patient.  Headache Associated symptoms: no abdominal pain, no back pain, no diarrhea, no pain, no nausea, no neck stiffness, no numbness and no vomiting    patient is a dull frontal headache. Began today. No numbness or weakness. No Confusions. No difficulty seen. No fevers. She was seen PCP yesterday and was found to have somewhat elevated blood pressure. She states she was wondering if her blood pressure is elevated today. Her nausea. No photophobia. FPast Medical History  Diagnosis Date  . Hypertension   . 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 Endometriosis of colon 2009  . Pelvic adhesions   . Pneumonia AGE 89  . Blood transfusion 02-16-11    '05 with ovary surgery  . Preterm labor 2004  . Infection     OCC  . Infection AGE 57    CHLAMYDIA  . Anemia     TAKES FE OCC  . Fibroids 2004  . Hypertension 2007    DR [facility]   ON MED  . Eczema    Past Surgical History  Procedure Laterality Date  . Cesarean section    . Breast cyst excision  02/22/2011    Procedure: CYST EXCISION BREAST;  Surgeon: Currie Paris, MD;  Location: [facility] ORS;  Service: General;  Laterality: Left;  Removal Left Breast Mass  . Salpyngooophorectomy      LSO 2005 for TOA  . Cesarean section with bilateral tubal ligation  01/28/2012    Procedure: CESAREAN SECTION WITH BILATERAL TUBAL LIGATION;  Surgeon: Esmeralda Arthur, MD;  Location: [facility] ORS;  Service: Obstetrics;  Laterality: Bilateral;  Repeat C/S - POSSIBLE BTL   Family History  Problem Relation Age of Onset  . Heart disease Mother   . Diabetes Father   . Heart disease Father     DIED AT 28   . Hypertension Maternal Grandmother   . Stroke Maternal Grandmother   . Heart disease Maternal Grandfather   . Diabetes Maternal Grandfather   . Lupus Sister   . Asthma Daughter    History  Substance Use Topics  . Smoking status: Never Smoker   . Smokeless tobacco: Never Used  . Alcohol Use: No   OB History   Grav Para Term Preterm Abortions TAB SAB Ect Mult Living   2 2 2       2      Review of Systems  Constitutional: Negative for activity change and appetite change.  HENT: Negative for neck stiffness.   Eyes: Negative for pain.  Respiratory: Negative for chest tightness and shortness of breath.   Cardiovascular: Negative for chest pain and leg swelling.  Gastrointestinal: Negative for nausea, vomiting, abdominal pain and diarrhea.  Genitourinary: Negative for flank pain.  Musculoskeletal: Positive for joint swelling. Negative for back pain.  Skin: Negative for rash.  Neurological: Positive for headaches. Negative for weakness and numbness.  Psychiatric/Behavioral: Negative for behavioral problems.    Allergies  Bee venom and Penicillins  Home Medications   Current Outpatient Rx  Name  Route  Sig  Dispense  Refill  . acetaminophen (TYLENOL) 325 MG tablet   Oral   Take 650 mg by mouth every 6 (six)  hours as needed. For pain         . butalbital-acetaminophen-caffeine (FIORICET) 50-325-40 MG per tablet   Oral   Take 1-2 tablets by mouth every 6 (six) hours as needed for headache.   20 tablet   0   . ferrous sulfate (FERROUSUL) 325 (65 FE) MG tablet   Oral   Take 1 tablet (325 mg total) by mouth 3 (three) times daily with meals.   100 tablet   11   . ibuprofen (ADVIL,MOTRIN) 800 MG tablet   Oral   Take 1 tablet (800 mg total) by mouth every 8 (eight) hours as needed for pain.   50 tablet   1   . iron polysaccharides (NU-IRON) 150 MG capsule   Oral   Take 150 mg by mouth 2 (two) times daily.         Marland Kitchen levonorgestrel (MIRENA) 20 MCG/24HR IUD    Intrauterine   1 each by Intrauterine route once.         Marland Kitchen NIFEdipine (PROCARDIA-XL/ADALAT-CC/NIFEDICAL-XL) 30 MG 24 hr tablet   Oral   Take 30 mg by mouth daily.          Marland Kitchen oxyCODONE-acetaminophen (ROXICET) 5-325 MG per tablet   Oral   Take 1 tablet by mouth every 4 (four) hours as needed for pain.   30 tablet   0   . Prenatal Vit-Fe Fumarate-FA (PRENATAL MULTIVITAMIN) TABS   Oral   Take 1 tablet by mouth daily.         . promethazine (PHENERGAN) 12.5 MG tablet   Oral   Take 1 tablet (12.5 mg total) by mouth every 6 (six) hours as needed for nausea.   30 tablet   0    BP 130/83  Pulse 94  Temp(Src) 98.1 F (36.7 C) (Oral)  Resp 18  Ht 5\' 4"  (1.626 m)  Wt 170 lb (77.111 kg)  BMI 29.17 kg/m2  SpO2 98%  LMP 08/04/2012  Breastfeeding? Yes Physical Exam  Nursing note and vitals reviewed. Constitutional: She is oriented to person, place, and time. She appears well-developed and well-nourished.  HENT:  Head: Normocephalic and atraumatic.  Eyes: EOM are normal. Pupils are equal, round, and reactive to light.  Neck: Normal range of motion. Neck supple.  Cardiovascular: Normal rate, regular rhythm and normal heart sounds.   No murmur heard. Pulmonary/Chest: Effort normal and breath sounds normal. No respiratory distress. She has no wheezes. She has no rales.  Abdominal: Soft. Bowel sounds are normal. She exhibits no distension. There is no tenderness. There is no rebound and no guarding.  Musculoskeletal: Normal range of motion.  Neurological: She is alert and oriented to person, place, and time. No cranial nerve deficit.  Skin: Skin is warm and dry.  Psychiatric: She has a normal mood and affect. Her speech is normal.    ED Course  Procedures (including critical care time) Labs Reviewed - No data to display No results found. 1. Headache     MDM   patient with headache. It is dull and frontal. There was gradual onset. Blood pressure is mildly elevated. I  doubt severe intracranial abnormality. Patient be discharged followup as needed   Juliet Rude. Rubin Payor, MD 08/27/12 2109

## 2012-08-27 NOTE — ED Notes (Signed)
C/o HA since 4pm today and tingling to both hands x 3 months-was seen by PCP yesterday her hernia and joint pain

## 2012-10-11 ENCOUNTER — Encounter (HOSPITAL_BASED_OUTPATIENT_CLINIC_OR_DEPARTMENT_OTHER): Payer: Self-pay | Admitting: *Deleted

## 2012-10-11 ENCOUNTER — Emergency Department (HOSPITAL_BASED_OUTPATIENT_CLINIC_OR_DEPARTMENT_OTHER)
Admission: EM | Admit: 2012-10-11 | Discharge: 2012-10-11 | Disposition: A | Discharge status: Home / Self Care | Payer: BC Managed Care – PPO | Attending: Emergency Medicine | Admitting: Emergency Medicine

## 2012-10-11 DIAGNOSIS — Z8701 Personal history of pneumonia (recurrent): Secondary | ICD-10-CM | POA: Insufficient documentation

## 2012-10-11 DIAGNOSIS — Z872 Personal history of diseases of the skin and subcutaneous tissue: Secondary | ICD-10-CM | POA: Insufficient documentation

## 2012-10-11 DIAGNOSIS — M5412 Radiculopathy, cervical region: Secondary | ICD-10-CM | POA: Insufficient documentation

## 2012-10-11 DIAGNOSIS — D649 Anemia, unspecified: Secondary | ICD-10-CM | POA: Insufficient documentation

## 2012-10-11 DIAGNOSIS — Z8619 Personal history of other infectious and parasitic diseases: Secondary | ICD-10-CM | POA: Insufficient documentation

## 2012-10-11 DIAGNOSIS — Z880 Allergy status to penicillin: Secondary | ICD-10-CM | POA: Insufficient documentation

## 2012-10-11 DIAGNOSIS — Z8742 Personal history of other diseases of the female genital tract: Secondary | ICD-10-CM | POA: Insufficient documentation

## 2012-10-11 DIAGNOSIS — Z79899 Other long term (current) drug therapy: Secondary | ICD-10-CM | POA: Insufficient documentation

## 2012-10-11 DIAGNOSIS — M792 Neuralgia and neuritis, unspecified: Secondary | ICD-10-CM

## 2012-10-11 DIAGNOSIS — I10 Essential (primary) hypertension: Secondary | ICD-10-CM | POA: Insufficient documentation

## 2012-10-11 HISTORY — DX: Encounter for other specified aftercare: Z51.89

## 2012-10-11 MED ORDER — IBUPROFEN 800 MG PO TABS: 800.0000 mg | ORAL_TABLET | Freq: Three times a day (TID) | ORAL | Status: DC

## 2012-10-11 NOTE — ED Notes (Signed)
Left arm pain and numbness "like and electrical shock" started yesterday while driving. Grips equal, speech clear, facial symmetry present

## 2012-10-11 NOTE — ED Provider Notes (Signed)
CSN: [id number]     Arrival date & time 10/11/12  1820 History   This chart was scribed for Rolan Bucco, MD by Karle Plumber, ED Scribe. This patient was seen in room MH07/MH07 and the patient's care was started at 8:38 PM.    Chief Complaint  Patient presents with  . Numbness   The history is provided by the patient. No language interpreter was used.   HPI Comments:  Terry Bryant is a 37 y.o. female with h/o HTN who presents to the Emergency Department complaining of constant, moderate LUE numbness and tingling onset yesterday. She states that tingling is mostly in her left hand, but sometimes extends up to her shoulder. She also reports moderate left upper arm pain that began yesterday. She describes it as an electric shock pain down her arm.  She denies neck or shoulder pain. She says that her symptoms started while she was driving. She states she has had similar episodes of symptoms that resolved on their own. Pt denies any aggravating or relieving factors. Pt states she took Motrin last night for pain with no relief. She denies chest pain, headache, vision changes, slurred speech, neck pain, weakness or any other symptoms. She denies any other recent illnesses. She denies any tobacco use or alcohol use.  Past Medical History  Diagnosis Date  . Hypertension   . 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 Endometriosis of colon 2009  . Pelvic adhesions   . Pneumonia AGE 62  . Blood transfusion 02-16-11    '05 with ovary surgery  . Preterm labor 2004  . Infection     OCC  . Infection AGE 9    CHLAMYDIA  . Anemia     TAKES FE OCC  . Fibroids 2004  . Hypertension 2007    DR [facility]   ON MED  . Eczema   . Blood transfusion without reported diagnosis    Past Surgical History  Procedure Laterality Date  . Cesarean section    . Breast cyst excision  02/22/2011    Procedure: CYST EXCISION BREAST;  Surgeon: Currie Paris, MD;  Location: [facility];  Service: General;   Laterality: Left;  Removal Left Breast Mass  . Salpyngooophorectomy      LSO 2005 for TOA  . Cesarean section with bilateral tubal ligation  01/28/2012    Procedure: CESAREAN SECTION WITH BILATERAL TUBAL LIGATION;  Surgeon: Esmeralda Arthur, MD;  Location: [facility];  Service: Obstetrics;  Laterality: Bilateral;  Repeat C/S - POSSIBLE BTL   Family History  Problem Relation Age of Onset  . Heart disease Mother   . Diabetes Father   . Heart disease Father     DIED AT 40  . Hypertension Maternal Grandmother   . Stroke Maternal Grandmother   . Heart disease Maternal Grandfather   . Diabetes Maternal Grandfather   . Lupus Sister   . Asthma Daughter    History  Substance Use Topics  . Smoking status: Never Smoker   . Smokeless tobacco: Never Used  . Alcohol Use: No   OB History   Grav Para Term Preterm Abortions TAB SAB Ect Mult Living   2 2 2       2      Review of Systems  Constitutional: Negative for fever, chills, diaphoresis and fatigue.  HENT: Negative for congestion, rhinorrhea and sneezing.   Eyes: Negative.   Respiratory: Negative for cough, chest tightness and shortness of breath.   Cardiovascular: Negative for  chest pain and leg swelling.  Gastrointestinal: Negative for nausea, vomiting, abdominal pain, diarrhea and blood in stool.  Genitourinary: Negative for frequency, hematuria, flank pain and difficulty urinating.  Musculoskeletal: Negative for back pain and arthralgias.  Skin: Negative for rash.  Neurological: Positive for numbness. Negative for dizziness, speech difficulty, weakness and headaches.    Allergies  Bee venom and Penicillins  Home Medications   Current Outpatient Rx  Name  Route  Sig  Dispense  Refill  . acetaminophen (TYLENOL) 325 MG tablet   Oral   Take 650 mg by mouth every 6 (six) hours as needed. For pain         . ibuprofen (ADVIL,MOTRIN) 800 MG tablet   Oral   Take 1 tablet (800 mg total) by mouth every 8 (eight) hours as needed for  pain.   50 tablet   1   . levonorgestrel (MIRENA) 20 MCG/24HR IUD   Intrauterine   1 each by Intrauterine route once.         Marland Kitchen NIFEdipine (PROCARDIA-XL/ADALAT-CC/NIFEDICAL-XL) 30 MG 24 hr tablet   Oral   Take 30 mg by mouth daily.          . Prenatal Vit-Fe Fumarate-FA (PRENATAL MULTIVITAMIN) TABS   Oral   Take 1 tablet by mouth daily.         . butalbital-acetaminophen-caffeine (FIORICET) 50-325-40 MG per tablet   Oral   Take 1-2 tablets by mouth every 6 (six) hours as needed for headache.   20 tablet   0   . ferrous sulfate (FERROUSUL) 325 (65 FE) MG tablet   Oral   Take 1 tablet (325 mg total) by mouth 3 (three) times daily with meals.   100 tablet   11   . ibuprofen (ADVIL,MOTRIN) 800 MG tablet   Oral   Take 1 tablet (800 mg total) by mouth 3 (three) times daily.   21 tablet   0   . iron polysaccharides (NU-IRON) 150 MG capsule   Oral   Take 150 mg by mouth 2 (two) times daily.         Marland Kitchen oxyCODONE-acetaminophen (ROXICET) 5-325 MG per tablet   Oral   Take 1 tablet by mouth every 4 (four) hours as needed for pain.   30 tablet   0   . promethazine (PHENERGAN) 12.5 MG tablet   Oral   Take 1 tablet (12.5 mg total) by mouth every 6 (six) hours as needed for nausea.   30 tablet   0    Triage Vitals: BP 151/88  Pulse 76  Temp(Src) 99 F (37.2 C) (Oral)  Resp 20  Ht 5\' 5"  (1.651 m)  Wt 170 lb (77.111 kg)  BMI 28.29 kg/m2  SpO2 100%  LMP 09/27/2012  Breastfeeding? Yes Physical Exam  Constitutional: She is oriented to person, place, and time. She appears well-developed and well-nourished.  HENT:  Head: Normocephalic and atraumatic.  Eyes: Pupils are equal, round, and reactive to light.  Neck: Normal range of motion. Neck supple.  Cardiovascular: Normal rate, regular rhythm and normal heart sounds.   Pulmonary/Chest: Effort normal and breath sounds normal. No respiratory distress. She has no wheezes. She has no rales. She exhibits no tenderness.   Abdominal: Soft. Bowel sounds are normal. There is no tenderness. There is no rebound and no guarding.  Musculoskeletal: Normal range of motion. She exhibits no edema.  +tenderness to left upper arm, primarily to posterior aspect of upper arm.  No pain to elbow or  shoulder.  No pain on palpation of neck.  Some decreased sensation to LT to hand, all fingers.  No weakness to hand/arm.  Lymphadenopathy:    She has no cervical adenopathy.  Neurological: She is alert and oriented to person, place, and time. She has normal strength. No cranial nerve deficit or sensory deficit. GCS eye subscore is 4. GCS verbal subscore is 5. GCS motor subscore is 6.  Gait normal  Skin: Skin is warm and dry. No rash noted.  Psychiatric: She has a normal mood and affect.    ED Course  Procedures (including critical care time) DIAGNOSTIC STUDIES: Oxygen Saturation is 100% on RA, normal by my interpretation.   COORDINATION OF CARE: 8:44 PM-Patient advised of plan to prescribed Ibuprofen for pain and advised to follow up with PCP for continued or worsened symptoms and patient agrees.  Medications - No data to display  Labs Review Labs Reviewed - No data to display Imaging Review No results found.  MDM   1. Neuralgia    Pt with left arm pain/tingling to hand.  Sounds like radicular pain.  No other neuro symptoms suggestive of stroke.  Advised NSAIDS, pt does not want stronger meds.  Will f/u with PMD.  I personally performed the services described in this documentation, which was scribed in my presence.  The recorded information has been reviewed and considered.     Rolan Bucco, MD 10/11/12 [phone]

## 2012-11-01 ENCOUNTER — Encounter (HOSPITAL_BASED_OUTPATIENT_CLINIC_OR_DEPARTMENT_OTHER): Payer: Self-pay | Admitting: *Deleted

## 2012-11-01 ENCOUNTER — Emergency Department (HOSPITAL_BASED_OUTPATIENT_CLINIC_OR_DEPARTMENT_OTHER)
Admission: EM | Admit: 2012-11-01 | Discharge: 2012-11-01 | Disposition: A | Discharge status: Home / Self Care | Payer: BC Managed Care – PPO | Attending: Emergency Medicine | Admitting: Emergency Medicine

## 2012-11-01 ENCOUNTER — Emergency Department (HOSPITAL_BASED_OUTPATIENT_CLINIC_OR_DEPARTMENT_OTHER): Payer: BC Managed Care – PPO

## 2012-11-01 DIAGNOSIS — R002 Palpitations: Secondary | ICD-10-CM | POA: Insufficient documentation

## 2012-11-01 DIAGNOSIS — D649 Anemia, unspecified: Secondary | ICD-10-CM | POA: Insufficient documentation

## 2012-11-01 DIAGNOSIS — Z8619 Personal history of other infectious and parasitic diseases: Secondary | ICD-10-CM | POA: Insufficient documentation

## 2012-11-01 DIAGNOSIS — Z8701 Personal history of pneumonia (recurrent): Secondary | ICD-10-CM | POA: Insufficient documentation

## 2012-11-01 DIAGNOSIS — T50905A Adverse effect of unspecified drugs, medicaments and biological substances, initial encounter: Secondary | ICD-10-CM

## 2012-11-01 DIAGNOSIS — Z8742 Personal history of other diseases of the female genital tract: Secondary | ICD-10-CM | POA: Insufficient documentation

## 2012-11-01 DIAGNOSIS — Z872 Personal history of diseases of the skin and subcutaneous tissue: Secondary | ICD-10-CM | POA: Insufficient documentation

## 2012-11-01 DIAGNOSIS — I10 Essential (primary) hypertension: Secondary | ICD-10-CM | POA: Insufficient documentation

## 2012-11-01 DIAGNOSIS — T50995A Adverse effect of other drugs, medicaments and biological substances, initial encounter: Secondary | ICD-10-CM | POA: Insufficient documentation

## 2012-11-01 DIAGNOSIS — Z79899 Other long term (current) drug therapy: Secondary | ICD-10-CM | POA: Insufficient documentation

## 2012-11-01 DIAGNOSIS — Z880 Allergy status to penicillin: Secondary | ICD-10-CM | POA: Insufficient documentation

## 2012-11-01 LAB — CBC WITH DIFFERENTIAL/PLATELET
Basophils Absolute: 0.1 10*3/uL (ref 0.0–0.1)
Eosinophils Relative: 6 % — ABNORMAL HIGH (ref 0–5)
HCT: 42.1 % (ref 36.0–46.0)
Lymphocytes Relative: 19 % (ref 12–46)
Monocytes Relative: 8 % (ref 3–12)
Platelets: 203 10*3/uL (ref 150–400)
RBC: 5.28 MIL/uL — ABNORMAL HIGH (ref 3.87–5.11)
RDW: 13 % (ref 11.5–15.5)
WBC: 6.6 10*3/uL (ref 4.0–10.5)

## 2012-11-01 LAB — COMPREHENSIVE METABOLIC PANEL
Albumin: 4.4 g/dL (ref 3.5–5.2)
Alkaline Phosphatase: 69 U/L (ref 39–117)
BUN: 12 mg/dL (ref 6–23)
Creatinine, Ser: 0.8 mg/dL (ref 0.50–1.10)
GFR calc Af Amer: >90 mL/min (ref >90)
Glucose, Bld: 112 mg/dL — ABNORMAL HIGH (ref 70–99)
Potassium: 3.4 mEq/L — ABNORMAL LOW (ref 3.5–5.1)
Total Protein: 8.4 g/dL — ABNORMAL HIGH (ref 6.0–8.3)

## 2012-11-01 LAB — TROPONIN I: Troponin I: <0.3 ng/mL (ref <0.30)

## 2012-11-01 MED ORDER — IOHEXOL 350 MG/ML SOLN
80.0000 mL | Freq: Once | INTRAVENOUS | Status: AC | PRN
  Administered 2012-11-01: 80 mL via INTRAVENOUS

## 2012-11-01 MED ORDER — SODIUM CHLORIDE 0.9 % IV BOLUS (SEPSIS)
500.0000 mL | Freq: Once | INTRAVENOUS | Status: AC
  Administered 2012-11-01: 500 mL via INTRAVENOUS

## 2012-11-01 NOTE — ED Provider Notes (Signed)
CSN: [id number]     Arrival date & time 11/01/12  0034 History   First MD Initiated Contact with Patient 11/01/12 [phone]     Chief Complaint  Patient presents with  . Chest Pain   (Consider location/radiation/quality/duration/timing/severity/associated sxs/prior Treatment) Patient is a 37 y.o. female presenting with chest pain. The history is provided by the patient. No language interpreter was used.  Chest Pain Pain location:  L chest Pain quality: sharp   Pain radiates to:  Does not radiate Pain radiates to the back: no   Pain severity:  Moderate Onset quality:  Sudden Timing:  Constant Progression:  Unchanged Chronicity:  New Context: not breathing and not lifting   Relieved by:  Nothing Worsened by:  Nothing tried Ineffective treatments:  None tried Associated symptoms: no abdominal pain, no back pain, no fever, no syncope and not vomiting   Risk factors: no aortic disease     Past Medical History  Diagnosis Date  . Hypertension   . 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 Endometriosis of colon 2009  . Pelvic adhesions   . Pneumonia AGE 31  . Blood transfusion 02-16-11    '05 with ovary surgery  . Preterm labor 2004  . Infection     OCC  . Infection AGE 52    CHLAMYDIA  . Anemia     TAKES FE OCC  . Fibroids 2004  . Hypertension 2007    DR [facility]   ON MED  . Eczema   . Blood transfusion without reported diagnosis    Past Surgical History  Procedure Laterality Date  . Cesarean section    . Breast cyst excision  02/22/2011    Procedure: CYST EXCISION BREAST;  Surgeon: Currie Paris, MD;  Location: [facility];  Service: General;  Laterality: Left;  Removal Left Breast Mass  . Salpyngooophorectomy      LSO 2005 for TOA  . Cesarean section with bilateral tubal ligation  01/28/2012    Procedure: CESAREAN SECTION WITH BILATERAL TUBAL LIGATION;  Surgeon: Esmeralda Arthur, MD;  Location: [facility];  Service: Obstetrics;  Laterality: Bilateral;  Repeat C/S - POSSIBLE BTL    Family History  Problem Relation Age of Onset  . Heart disease Mother   . Diabetes Father   . Heart disease Father     DIED AT 37  . Hypertension Maternal Grandmother   . Stroke Maternal Grandmother   . Heart disease Maternal Grandfather   . Diabetes Maternal Grandfather   . Lupus Sister   . Asthma Daughter    History  Substance Use Topics  . Smoking status: Never Smoker   . Smokeless tobacco: Never Used  . Alcohol Use: No   OB History   Grav Para Term Preterm Abortions TAB SAB Ect Mult Living   2 2 2       2      Review of Systems  Constitutional: Negative for fever.  HENT: Negative for neck pain and neck stiffness.   Respiratory: Negative for wheezing.   Cardiovascular: Positive for chest pain. Negative for leg swelling and syncope.  Gastrointestinal: Negative for vomiting and abdominal pain.  Musculoskeletal: Negative for back pain.  All other systems reviewed and are negative.    Allergies  Bee venom and Penicillins  Home Medications   Current Outpatient Rx  Name  Route  Sig  Dispense  Refill  . acetaminophen (TYLENOL) 325 MG tablet   Oral   Take 650 mg by mouth every 6 (six)  hours as needed. For pain         . ferrous sulfate (FERROUSUL) 325 (65 FE) MG tablet   Oral   Take 1 tablet (325 mg total) by mouth 3 (three) times daily with meals.   100 tablet   11   . ibuprofen (ADVIL,MOTRIN) 800 MG tablet   Oral   Take 1 tablet (800 mg total) by mouth every 8 (eight) hours as needed for pain.   50 tablet   1   . ibuprofen (ADVIL,MOTRIN) 800 MG tablet   Oral   Take 1 tablet (800 mg total) by mouth 3 (three) times daily.   21 tablet   0   . iron polysaccharides (NU-IRON) 150 MG capsule   Oral   Take 150 mg by mouth 2 (two) times daily.         Marland Kitchen levonorgestrel (MIRENA) 20 MCG/24HR IUD   Intrauterine   1 each by Intrauterine route once.         Marland Kitchen NIFEdipine (PROCARDIA-XL/ADALAT-CC/NIFEDICAL-XL) 30 MG 24 hr tablet   Oral   Take 60 mg by  mouth daily.          Marland Kitchen oxyCODONE-acetaminophen (ROXICET) 5-325 MG per tablet   Oral   Take 1 tablet by mouth every 4 (four) hours as needed for pain.   30 tablet   0   . Prenatal Vit-Fe Fumarate-FA (PRENATAL MULTIVITAMIN) TABS   Oral   Take 1 tablet by mouth daily.         . promethazine (PHENERGAN) 12.5 MG tablet   Oral   Take 1 tablet (12.5 mg total) by mouth every 6 (six) hours as needed for nausea.   30 tablet   0    BP 121/79  Pulse 104  Temp(Src) 98.1 F (36.7 C) (Oral)  Resp 18  Ht 5\' 5"  (1.651 m)  Wt 168 lb (76.204 kg)  BMI 27.96 kg/m2  SpO2 100%  LMP 10/22/2012 Physical Exam  Constitutional: She is oriented to person, place, and time. She appears well-developed and well-nourished.  HENT:  Head: Normocephalic and atraumatic.  Mouth/Throat: Oropharynx is clear and moist.  Eyes: Conjunctivae are normal. Pupils are equal, round, and reactive to light.  Neck: Normal range of motion. Neck supple.  Cardiovascular: Normal rate, regular rhythm and intact distal pulses.   Pulmonary/Chest: Effort normal and breath sounds normal. No respiratory distress. She has no wheezes. She has no rales.  Abdominal: Soft. Bowel sounds are normal. There is no tenderness. There is no rebound and no guarding.  Musculoskeletal: Normal range of motion.  Neurological: She is alert and oriented to person, place, and time.  Skin: Skin is warm and dry.    ED Course  Procedures (including critical care time) Labs Review Labs Reviewed  COMPREHENSIVE METABOLIC PANEL - Abnormal; Notable for the following:    Potassium 3.4 (*)    Glucose, Bld 112 (*)    Total Protein 8.4 (*)    All other components within normal limits  CBC WITH DIFFERENTIAL - Abnormal; Notable for the following:    RBC 5.28 (*)    MCH 25.8 (*)    Eosinophils Relative 6 (*)    All other components within normal limits  TROPONIN I   Imaging Review Dg Chest 2 View  11/01/2012   *RADIOLOGY REPORT*  Clinical Data:  Left-sided chest pain shortness of breath  CHEST - 2 VIEW  Comparison: 01/03/2011  Findings: Normal heart, mediastinal, and hilar contours.  Normal pulmonary vascularity.  The lungs are well expanded and clear.  No pleural abnormality.  Bony thorax intact.  IMPRESSION: No acute cardiopulmonary disease.   Original Report Authenticated By: Britta Mccreedy, M.D.   Ct Angio Chest Pe W/cm &/or Wo Cm  11/01/2012   CLINICAL DATA:  Left chest pain, shortness of Breath, hypertension.  EXAM: CT ANGIOGRAPHY CHEST WITH CONTRAST  TECHNIQUE: Multidetector CT imaging of the chest was performed using the standard protocol during bolus administration of intravenous contrast. Multiplanar CT image reconstructions including MIPs were obtained to evaluate the vascular anatomy.  CONTRAST:  80mL OMNIPAQUE IOHEXOL 350 MG/ML SOLN  COMPARISON:  None.  FINDINGS: Satisfactory opacification of pulmonary arteries noted, and there is no evidence of pulmonary emboli. Adequate contrast opacification of the thoracic aorta with no evidence of dissection, aneurysm, or stenosis. There is classic 3-vessel brachiocephalic arch anatomy without proximal stenosis. No hilar or mediastinal adenopathy. No pleural or pericardial effusion. Lungs are clear. Visualized portions of the upper abdomen unremarkable. Thoracic spine and sternum intact. No pneumothorax.  Review of the MIP images confirms the above findings.  IMPRESSION: Negative for acute PE or thoracic aortic dissection.   Electronically Signed   By: Oley Balm M.D.   On: 11/01/2012 02:48    MDM   1. Adverse reaction to over-the-counter medication, initial encounter     Date: 11/01/2012  Rate 87  Rhythm: normal sinus rhythm  QRS Axis: normal  Intervals: normal  ST/T Wave abnormalities: normal  Conduction Disutrbances: none  Narrative Interpretation: unremarkable  Follow up with cardiology for ongoing symptoms.  Do not take herbals with ma huang or pseudofed like components.         Jasmine Awe, MD 11/01/12 [phone]

## 2012-11-01 NOTE — ED Notes (Signed)
Pt woke up with left sided cp and SOB

## 2013-06-05 ENCOUNTER — Emergency Department (HOSPITAL_BASED_OUTPATIENT_CLINIC_OR_DEPARTMENT_OTHER)
Admission: EM | Admit: 2013-06-05 | Discharge: 2013-06-05 | Disposition: A | Discharge status: Home / Self Care | Payer: BC Managed Care – PPO | Attending: Emergency Medicine | Admitting: Emergency Medicine

## 2013-06-05 ENCOUNTER — Encounter (HOSPITAL_BASED_OUTPATIENT_CLINIC_OR_DEPARTMENT_OTHER): Payer: Self-pay | Admitting: Emergency Medicine

## 2013-06-05 DIAGNOSIS — I10 Essential (primary) hypertension: Secondary | ICD-10-CM

## 2013-06-05 DIAGNOSIS — Z8619 Personal history of other infectious and parasitic diseases: Secondary | ICD-10-CM | POA: Insufficient documentation

## 2013-06-05 DIAGNOSIS — M792 Neuralgia and neuritis, unspecified: Secondary | ICD-10-CM

## 2013-06-05 DIAGNOSIS — Z872 Personal history of diseases of the skin and subcutaneous tissue: Secondary | ICD-10-CM | POA: Insufficient documentation

## 2013-06-05 DIAGNOSIS — D649 Anemia, unspecified: Secondary | ICD-10-CM | POA: Insufficient documentation

## 2013-06-05 DIAGNOSIS — Z79899 Other long term (current) drug therapy: Secondary | ICD-10-CM | POA: Insufficient documentation

## 2013-06-05 DIAGNOSIS — IMO0002 Reserved for concepts with insufficient information to code with codable children: Secondary | ICD-10-CM | POA: Insufficient documentation

## 2013-06-05 DIAGNOSIS — R209 Unspecified disturbances of skin sensation: Secondary | ICD-10-CM | POA: Insufficient documentation

## 2013-06-05 DIAGNOSIS — Z8742 Personal history of other diseases of the female genital tract: Secondary | ICD-10-CM | POA: Insufficient documentation

## 2013-06-05 DIAGNOSIS — Z791 Long term (current) use of non-steroidal anti-inflammatories (NSAID): Secondary | ICD-10-CM | POA: Insufficient documentation

## 2013-06-05 DIAGNOSIS — Z880 Allergy status to penicillin: Secondary | ICD-10-CM | POA: Insufficient documentation

## 2013-06-05 DIAGNOSIS — Z8751 Personal history of pre-term labor: Secondary | ICD-10-CM | POA: Insufficient documentation

## 2013-06-05 DIAGNOSIS — Z8701 Personal history of pneumonia (recurrent): Secondary | ICD-10-CM | POA: Insufficient documentation

## 2013-06-05 MED ORDER — CYCLOBENZAPRINE HCL 10 MG PO TABS: 10.0000 mg | ORAL_TABLET | Freq: Three times a day (TID) | ORAL | Status: DC

## 2013-06-05 MED ORDER — CYCLOBENZAPRINE HCL 10 MG PO TABS
10.0000 mg | ORAL_TABLET | Freq: Once | ORAL | Status: AC
  Administered 2013-06-05: 10 mg via ORAL
  Filled 2013-06-05: qty 1

## 2013-06-05 MED ORDER — NAPROXEN 500 MG PO TABS: 500.0000 mg | ORAL_TABLET | Freq: Two times a day (BID) | ORAL | Status: DC

## 2013-06-05 MED ORDER — NAPROXEN 250 MG PO TABS
500.0000 mg | ORAL_TABLET | Freq: Once | ORAL | Status: AC
  Administered 2013-06-05: 500 mg via ORAL
  Filled 2013-06-05: qty 2

## 2013-06-05 NOTE — ED Notes (Signed)
Pain in her head today. Sudden onset. Worse pain she has ever had. Tingling and numbness in her left arm.

## 2013-06-05 NOTE — Discharge Instructions (Signed)

## 2013-06-05 NOTE — ED Notes (Signed)
Pt sts sharp intermittent pain to Left parietal region of head. Also sts tingling to left fingers. Pt sts her head does not hurt like a headache. No history of migraines. Intermittent dizziness as well. Sitting relieved dizziness. 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 Neurological assessment negative for abnormalities.

## 2013-06-07 NOTE — ED Provider Notes (Signed)
CSN: [id number]     Arrival date & time 06/05/13  1543 History   First MD Initiated Contact with Patient 06/05/13 1624     Chief Complaint  Patient presents with  . Headache     (Consider location/radiation/quality/duration/timing/severity/associated sxs/prior Treatment) Patient is a 38 y.o. female presenting with headaches. The history is provided by the patient.  Headache Pain location:  L parietal Quality:  Sharp and stabbing Radiates to:  Does not radiate Onset quality:  Sudden Duration:  4 hours Timing:  Intermittent Progression:  Waxing and waning Chronicity:  New Context: emotional stress   Relieved by:  Nothing Worsened by:  Nothing tried Ineffective treatments:  None tried Associated symptoms: paresthesias and tingling   Associated symptoms: no abdominal pain, no back pain, no blurred vision, no dizziness, no pain, no facial pain, no fatigue, no fever, no focal weakness, no hearing loss, no loss of balance, no nausea, no neck pain, no neck stiffness, no numbness, no photophobia, no seizures, no sore throat, no URI, no visual change, no vomiting and no weakness   Risk factors: no family hx of SAH     Patient reports sudden onset of intermittent sharp, stabbing pains to a localized area of her left scalp.  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  She also reports associated tingling to her left fingers, but denies numbness or tingling of the entire arm.  She does not describe pain as a headache.  She denies recent illness, fever, visual changes, facial numbness or weakness, neck pain or stiffness.  She reports dizziness earlier but has now resolved.   She does report frequent stressors.    Past Medical History  Diagnosis Date  . Hypertension   . 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 Endometriosis of colon 2009  . Pelvic adhesions   . Pneumonia AGE 89  . Blood transfusion 02-16-11    '05 with ovary surgery  . Preterm labor 2004  .  Infection     OCC  . Infection AGE 64    CHLAMYDIA  . Anemia     TAKES FE OCC  . Fibroids 2004  . Hypertension 2007    DR [facility]   ON MED  . Eczema   . Blood transfusion without reported diagnosis    Past Surgical History  Procedure Laterality Date  . Cesarean section    . Breast cyst excision  02/22/2011    Procedure: CYST EXCISION BREAST;  Surgeon: Haywood Lasso, MD;  Location: [facility];  Service: General;  Laterality: Left;  Removal Left Breast Mass  . Salpyngooophorectomy      LSO 2005 for TOA  . Cesarean section with bilateral tubal ligation  01/28/2012    Procedure: CESAREAN SECTION WITH BILATERAL TUBAL LIGATION;  Surgeon: Alwyn Pea, MD;  Location: [facility];  Service: Obstetrics;  Laterality: Bilateral;  Repeat C/S - POSSIBLE BTL   Family History  Problem Relation Age of Onset  . Heart disease Mother   . Diabetes Father   . Heart disease Father     DIED AT 43  . Hypertension Maternal Grandmother   . Stroke Maternal Grandmother   . Heart disease Maternal Grandfather   . Diabetes Maternal Grandfather   . Lupus Sister   . Asthma Daughter    History  Substance Use Topics  . Smoking status: Never Smoker   . Smokeless tobacco: Never Used  . Alcohol Use: No   OB History  Grav Para Term Preterm Abortions TAB SAB Ect Mult Living   2 2 2       2      Review of Systems  Constitutional: Negative for fever, activity change, appetite change and fatigue.  HENT: Negative for facial swelling, hearing loss, sore throat and trouble swallowing.   Eyes: Negative for blurred vision, photophobia, pain and visual disturbance.  Respiratory: Negative for shortness of breath.   Cardiovascular: Negative for chest pain.  Gastrointestinal: Negative for nausea, vomiting and abdominal pain.  Musculoskeletal: Negative for back pain, neck pain and neck stiffness.  Skin: Negative for rash and wound.  Neurological: Positive for paresthesias. Negative for dizziness, focal weakness,  seizures, facial asymmetry, speech difficulty, weakness, numbness and loss of balance.       Sharp stabbing intermittent pains to her left scalp  Psychiatric/Behavioral: Negative for confusion and decreased concentration.  All other systems reviewed and are negative.     Allergies  Bee venom and Penicillins  Home Medications   Prior to Admission medications   Medication Sig Start Date End Date Taking? Authorizing Provider  acetaminophen (TYLENOL) 325 MG tablet Take 650 mg by mouth every 6 (six) hours as needed. For pain    Historical Provider, MD  cyclobenzaprine (FLEXERIL) 10 MG tablet Take 1 tablet (10 mg total) by mouth 3 (three) times daily. 06/05/13   Donny Heffern L. Cleotilde Spadaccini, PA-C  ferrous sulfate (FERROUSUL) 325 (65 FE) MG tablet Take 1 tablet (325 mg total) by mouth 3 (three) times daily with meals. 01/31/12   Ena Dawley, MD  ibuprofen (ADVIL,MOTRIN) 800 MG tablet Take 1 tablet (800 mg total) by mouth every 8 (eight) hours as needed for pain. 01/31/12   Ena Dawley, MD  ibuprofen (ADVIL,MOTRIN) 800 MG tablet Take 1 tablet (800 mg total) by mouth 3 (three) times daily. 10/11/12   Malvin Johns, MD  iron polysaccharides (NU-IRON) 150 MG capsule Take 150 mg by mouth 2 (two) times daily.    Historical Provider, MD  levonorgestrel (MIRENA) 20 MCG/24HR IUD 1 each by Intrauterine route once.    Historical Provider, MD  naproxen (NAPROSYN) 500 MG tablet Take 1 tablet (500 mg total) by mouth 2 (two) times daily. Take with food 06/05/13   Emiliana Blaize L. Shatira Dobosz, PA-C  NIFEdipine (PROCARDIA-XL/ADALAT-CC/NIFEDICAL-XL) 30 MG 24 hr tablet Take 30 mg by mouth daily.     Historical Provider, MD  oxyCODONE-acetaminophen (ROXICET) 5-325 MG per tablet Take 1 tablet by mouth every 4 (four) hours as needed for pain. 01/31/12   Ena Dawley, MD  Prenatal Vit-Fe Fumarate-FA (PRENATAL MULTIVITAMIN) TABS Take 1 tablet by mouth daily.    Historical Provider, MD  promethazine (PHENERGAN) 12.5 MG tablet Take 1  tablet (12.5 mg total) by mouth every 6 (six) hours as needed for nausea. 01/31/12   Ena Dawley, MD   BP 148/94  Pulse 82  Temp(Src) 98 F (36.7 C) (Oral)  Resp 16  SpO2 100%  LMP 05/13/2013 Physical Exam  Nursing note and vitals reviewed. Constitutional: She is oriented to person, place, and time. She appears well-developed and well-nourished. No distress.  HENT:  Head: Normocephalic and atraumatic.  Mouth/Throat: Oropharynx is clear and moist.  Eyes: Conjunctivae and EOM are normal. Pupils are equal, round, and reactive to light.  Neck: Normal range of motion, full passive range of motion without pain and phonation normal. Neck supple. No spinous process tenderness and no muscular tenderness present. No rigidity. No Brudzinski's sign and no Kernig's sign noted.  Cardiovascular: Normal rate, regular  rhythm, normal heart sounds and intact distal pulses.   No murmur heard. Pulmonary/Chest: Effort normal and breath sounds normal. No respiratory distress. She exhibits no tenderness.  Musculoskeletal: Normal range of motion. She exhibits no tenderness.  Neurological: She is alert and oriented to person, place, and time. She has normal strength. She is not disoriented. No cranial nerve deficit or sensory deficit. She exhibits normal muscle tone. Coordination and gait normal. GCS eye subscore is 4. GCS verbal subscore is 5. GCS motor subscore is 6.  Reflex Scores:      Tricep reflexes are 2+ on the right side and 2+ on the left side.      Bicep reflexes are 2+ on the right side and 2+ on the left side. 5/5 strength of the bilateral UE's.  Skin: Skin is warm and dry.  Psychiatric: She has a normal mood and affect.    ED Course  Procedures (including critical care time) Labs Review Labs Reviewed - No data to display  Imaging Review No results found.   EKG Interpretation None      MDM   Final diagnoses:  Neuralgia  Hypertension    Pt is well appearing, no meningeal  signs.  Vitals stable.  No focal neuro deficits.  No chest pain, tachycardia or dyspnea.  Pt has 5/5 UE strength bilaterally.  No concerning sx's for ACS , TIA, or SAH.  Pt reports hx of HTN that has been poorly controlled.  Sx's likely related to stress or neuralgia.  Pt hx , exam and care plan discussed with Dr. Karle Starch.  Imaging not indicated at this time. I have advised her to f/u with her PMD  And also advised her to return here for any worsening symptoms.  Will treat symptomically with flexeril and naprosyn.  Pt agrees to plan and appears stable for d/c  Lorenza Shakir L. Vanessa [place], PA-C 06/07/13 1434

## 2013-06-08 NOTE — ED Provider Notes (Signed)
Medical screening examination/treatment/procedure(s) were performed by non-physician practitioner and as supervising physician I was immediately available for consultation/collaboration.   EKG Interpretation None        Charles B. Sheldon, MD 06/08/13 0657 

## 2013-06-29 ENCOUNTER — Encounter (HOSPITAL_BASED_OUTPATIENT_CLINIC_OR_DEPARTMENT_OTHER): Payer: Self-pay | Admitting: Emergency Medicine

## 2013-06-29 ENCOUNTER — Emergency Department (HOSPITAL_BASED_OUTPATIENT_CLINIC_OR_DEPARTMENT_OTHER)
Admission: EM | Admit: 2013-06-29 | Discharge: 2013-06-29 | Disposition: A | Discharge status: Home / Self Care | Payer: BC Managed Care – PPO | Attending: Emergency Medicine | Admitting: Emergency Medicine

## 2013-06-29 DIAGNOSIS — Z8701 Personal history of pneumonia (recurrent): Secondary | ICD-10-CM | POA: Insufficient documentation

## 2013-06-29 DIAGNOSIS — Z79899 Other long term (current) drug therapy: Secondary | ICD-10-CM | POA: Insufficient documentation

## 2013-06-29 DIAGNOSIS — D649 Anemia, unspecified: Secondary | ICD-10-CM | POA: Insufficient documentation

## 2013-06-29 DIAGNOSIS — N3090 Cystitis, unspecified without hematuria: Secondary | ICD-10-CM

## 2013-06-29 DIAGNOSIS — Z880 Allergy status to penicillin: Secondary | ICD-10-CM | POA: Insufficient documentation

## 2013-06-29 DIAGNOSIS — Z3202 Encounter for pregnancy test, result negative: Secondary | ICD-10-CM | POA: Insufficient documentation

## 2013-06-29 DIAGNOSIS — I10 Essential (primary) hypertension: Secondary | ICD-10-CM | POA: Insufficient documentation

## 2013-06-29 LAB — URINALYSIS, ROUTINE W REFLEX MICROSCOPIC
Bilirubin Urine: NEGATIVE
GLUCOSE, UA: NEGATIVE mg/dL
Ketones, ur: 15 mg/dL — AB
Nitrite: POSITIVE — AB
PROTEIN: 100 mg/dL — AB
Specific Gravity, Urine: 1.027 (ref 1.005–1.030)
Urobilinogen, UA: 1 mg/dL (ref 0.0–1.0)
pH: 5.5 (ref 5.0–8.0)

## 2013-06-29 LAB — URINE MICROSCOPIC-ADD ON

## 2013-06-29 LAB — PREGNANCY, URINE: PREG TEST UR: NEGATIVE

## 2013-06-29 MED ORDER — CIPROFLOXACIN HCL 500 MG PO TABS
500.0000 mg | ORAL_TABLET | Freq: Once | ORAL | Status: AC
  Administered 2013-06-29: 500 mg via ORAL
  Filled 2013-06-29: qty 1

## 2013-06-29 MED ORDER — CIPROFLOXACIN HCL 500 MG PO TABS: 500.0000 mg | ORAL_TABLET | Freq: Two times a day (BID) | ORAL | Status: DC

## 2013-06-29 NOTE — ED Notes (Signed)
Pt c/o painful freq urination with some blood noted per pt x 1 day

## 2013-06-29 NOTE — ED Provider Notes (Signed)
CSN: [id number]     Arrival date & time 06/29/13  1844 History  This chart was scribed for Malvin Johns, MD by Elby Beck, ED Scribe. This patient was seen in room MH12/MH12 and the patient's care was started at 9:36 PM.   Chief Complaint  Patient presents with  . Hematuria    The history is provided by the patient. No language interpreter was used.    HPI Comments: Terry Bryant is a 38 y.o. female who presents to the Emergency Department complaining of dysuria onset today. She also reports noticing a small amount of blood mixed in with her urine today. She states that she had aching bilateral back pain last week. She states that she has a history of UTIs, and that her current symptoms feel similar. She denies any associated abdominal pain, fever or emesis  PCP- Dr. Jefm Petty, [facility]   Past Medical History  Diagnosis Date  . Hypertension   . 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 Endometriosis of colon 2009  . Pelvic adhesions   . Pneumonia AGE 64  . Blood transfusion 02-16-11    '05 with ovary surgery  . Preterm labor 2004  . Infection     OCC  . Infection AGE 21    CHLAMYDIA  . Anemia     TAKES FE OCC  . Fibroids 2004  . Hypertension 2007    DR [facility]   ON MED  . Eczema   . Blood transfusion without reported diagnosis    Past Surgical History  Procedure Laterality Date  . Cesarean section    . Breast cyst excision  02/22/2011    Procedure: CYST EXCISION BREAST;  Surgeon: Haywood Lasso, MD;  Location: [facility];  Service: General;  Laterality: Left;  Removal Left Breast Mass  . Salpyngooophorectomy      LSO 2005 for TOA  . Cesarean section with bilateral tubal ligation  01/28/2012    Procedure: CESAREAN SECTION WITH BILATERAL TUBAL LIGATION;  Surgeon: Alwyn Pea, MD;  Location: [facility];  Service: Obstetrics;  Laterality: Bilateral;  Repeat C/S - POSSIBLE BTL   Family History  Problem Relation Age of Onset  . Heart disease Mother   .  Diabetes Father   . Heart disease Father     DIED AT 32  . Hypertension Maternal Grandmother   . Stroke Maternal Grandmother   . Heart disease Maternal Grandfather   . Diabetes Maternal Grandfather   . Lupus Sister   . Asthma Daughter    History  Substance Use Topics  . Smoking status: Never Smoker   . Smokeless tobacco: Never Used  . Alcohol Use: No   OB History   Grav Para Term Preterm Abortions TAB SAB Ect Mult Living   2 2 2       2      Review of Systems  Constitutional: Negative for fever, chills, diaphoresis and fatigue.  HENT: Negative for congestion, rhinorrhea and sneezing.   Eyes: Negative.   Respiratory: Negative for cough, chest tightness and shortness of breath.   Cardiovascular: Negative for chest pain and leg swelling.  Gastrointestinal: Negative for nausea, vomiting, abdominal pain, diarrhea and blood in stool.  Genitourinary: Positive for dysuria and hematuria. Negative for flank pain and difficulty urinating.  Musculoskeletal: Positive for back pain (resolved). Negative for arthralgias.  Skin: Negative for rash.  Neurological: Negative for dizziness, speech difficulty, weakness, numbness and headaches.    Allergies  Bee venom and Penicillins  Home Medications  Prior to Admission medications   Medication Sig Start Date End Date Taking? Authorizing Provider  acetaminophen (TYLENOL) 325 MG tablet Take 650 mg by mouth every 6 (six) hours as needed. For pain    Historical Provider, MD  cyclobenzaprine (FLEXERIL) 10 MG tablet Take 1 tablet (10 mg total) by mouth 3 (three) times daily. 06/05/13   Tammy L. Triplett, PA-C  ferrous sulfate (FERROUSUL) 325 (65 FE) MG tablet Take 1 tablet (325 mg total) by mouth 3 (three) times daily with meals. 01/31/12   Ena Dawley, MD  ibuprofen (ADVIL,MOTRIN) 800 MG tablet Take 1 tablet (800 mg total) by mouth every 8 (eight) hours as needed for pain. 01/31/12   Ena Dawley, MD  ibuprofen (ADVIL,MOTRIN) 800 MG tablet  Take 1 tablet (800 mg total) by mouth 3 (three) times daily. 10/11/12   Malvin Johns, MD  iron polysaccharides (NU-IRON) 150 MG capsule Take 150 mg by mouth 2 (two) times daily.    Historical Provider, MD  levonorgestrel (MIRENA) 20 MCG/24HR IUD 1 each by Intrauterine route once.    Historical Provider, MD  naproxen (NAPROSYN) 500 MG tablet Take 1 tablet (500 mg total) by mouth 2 (two) times daily. Take with food 06/05/13   Tammy L. Triplett, PA-C  NIFEdipine (PROCARDIA-XL/ADALAT-CC/NIFEDICAL-XL) 30 MG 24 hr tablet Take 30 mg by mouth daily.     Historical Provider, MD  oxyCODONE-acetaminophen (ROXICET) 5-325 MG per tablet Take 1 tablet by mouth every 4 (four) hours as needed for pain. 01/31/12   Ena Dawley, MD  Prenatal Vit-Fe Fumarate-FA (PRENATAL MULTIVITAMIN) TABS Take 1 tablet by mouth daily.    Historical Provider, MD  promethazine (PHENERGAN) 12.5 MG tablet Take 1 tablet (12.5 mg total) by mouth every 6 (six) hours as needed for nausea. 01/31/12   Ena Dawley, MD   Triage Vitals: BP 147/97  Pulse 87  Temp(Src) 98.7 F (37.1 C) (Oral)  Resp 16  Ht 5\' 5"  (1.651 m)  Wt 170 lb (77.111 kg)  BMI 28.29 kg/m2  SpO2 97%  LMP 06/12/2013  Physical Exam  Nursing note and vitals reviewed. Constitutional: She is oriented to person, place, and time. She appears well-developed and well-nourished.  HENT:  Head: Normocephalic and atraumatic.  Eyes: Pupils are equal, round, and reactive to light.  Neck: Normal range of motion. Neck supple.  Cardiovascular: Normal rate, regular rhythm and normal heart sounds.   Pulmonary/Chest: Effort normal and breath sounds normal. No respiratory distress. She has no wheezes. She has no rales. She exhibits no tenderness.  Abdominal: Soft. Bowel sounds are normal. There is no tenderness. There is no rebound and no guarding.  Genitourinary:  No CVA tenderness  Musculoskeletal: Normal range of motion. She exhibits no edema.  Lymphadenopathy:    She has no  cervical adenopathy.  Neurological: She is alert and oriented to person, place, and time.  Skin: Skin is warm and dry. No rash noted.  Psychiatric: She has a normal mood and affect.    ED Course  Procedures (including critical care time)  DIAGNOSTIC STUDIES: Oxygen Saturation is 97% on RA, normal by my interpretation.    COORDINATION OF CARE: 9:40 PM- Discussed UA findings, indicating a UTI. Will treat with antibiotics. Pt advised of plan for treatment and pt agrees.  Labs Review Labs Reviewed  URINALYSIS, ROUTINE W REFLEX MICROSCOPIC - Abnormal; Notable for the following:    APPearance CLOUDY (*)    Hgb urine dipstick LARGE (*)    Ketones, ur 15 (*)  Protein, ur 100 (*)    Nitrite POSITIVE (*)    Leukocytes, UA LARGE (*)    All other components within normal limits  URINE MICROSCOPIC-ADD ON - Abnormal; Notable for the following:    Squamous Epithelial / LPF FEW (*)    Bacteria, UA MANY (*)    All other components within normal limits  URINE CULTURE  PREGNANCY, URINE    Imaging Review No results found.   EKG Interpretation None      MDM   Final diagnoses:  Cystitis    Patient started on Cipro. Her symptoms somewhat consistent with cystitis versus renal colic. Advised her if her symptoms don't improve within the next few days she needs to followup with her primary care physician or return here to the emergency department she has any worsening symptoms.   I personally performed the services described in this documentation, which was scribed in my presence.  The recorded information has been reviewed and considered.   Malvin Johns, MD 06/29/13 2150

## 2013-06-29 NOTE — Discharge Instructions (Signed)
Urinary Tract Infection  Urinary tract infections (UTIs) can develop anywhere along your urinary tract. Your urinary tract is your body's drainage system for removing wastes and extra water. Your urinary tract includes two kidneys, two ureters, a bladder, and a urethra. Your kidneys are a pair of bean-shaped organs. Each kidney is about the size of your fist. They are located below your ribs, one on each side of your spine.  CAUSES  Infections are caused by microbes, which are microscopic organisms, including fungi, viruses, and bacteria. These organisms are so small that they can only be seen through a microscope. Bacteria are the microbes that most commonly cause UTIs.  SYMPTOMS   Symptoms of UTIs may vary by age and gender of the patient and by the location of the infection. Symptoms in young women typically include a frequent and intense urge to urinate and a painful, burning feeling in the bladder or urethra during urination. Older women and men are more likely to be tired, shaky, and weak and have muscle aches and abdominal pain. A fever may mean the infection is in your kidneys. Other symptoms of a kidney infection include pain in your back or sides below the ribs, nausea, and vomiting.  DIAGNOSIS  To diagnose a UTI, your caregiver will ask you about your symptoms. Your caregiver also will ask to provide a urine sample. The urine sample will be tested for bacteria and white blood cells. White blood cells are made by your body to help fight infection.  TREATMENT   Typically, UTIs can be treated with medication. Because most UTIs are caused by a bacterial infection, they usually can be treated with the use of antibiotics. The choice of antibiotic and length of treatment depend on your symptoms and the type of bacteria causing your infection.  HOME CARE INSTRUCTIONS   If you were prescribed antibiotics, take them exactly as your caregiver instructs you. Finish the medication even if you feel better after you  have only taken some of the medication.   Drink enough water and fluids to keep your urine clear or pale yellow.   Avoid caffeine, tea, and carbonated beverages. They tend to irritate your bladder.   Empty your bladder often. Avoid holding urine for long periods of time.   Empty your bladder before and after sexual intercourse.   After a bowel movement, women should cleanse from front to back. Use each tissue only once.  SEEK MEDICAL CARE IF:    You have back pain.   You develop a fever.   Your symptoms do not begin to resolve within 3 days.  SEEK IMMEDIATE MEDICAL CARE IF:    You have severe back pain or lower abdominal pain.   You develop chills.   You have nausea or vomiting.   You have continued burning or discomfort with urination.  MAKE SURE YOU:    Understand these instructions.   Will watch your condition.   Will get help right away if you are not doing well or get worse.  Document Released: 11/08/2004 Document Revised: 07/31/2011 Document Reviewed: 03/09/2011  [employer] Patient Information 2014 [employer]

## 2013-07-01 LAB — URINE CULTURE
Colony Count: >=100000
Special Requests: NORMAL

## 2013-07-02 ENCOUNTER — Telehealth (HOSPITAL_BASED_OUTPATIENT_CLINIC_OR_DEPARTMENT_OTHER): Payer: Self-pay | Admitting: Emergency Medicine

## 2013-07-02 NOTE — Telephone Encounter (Signed)
Post ED Visit - Positive Culture Follow-up  Culture report reviewed by antimicrobial stewardship pharmacist: []  [place], Pharm.D., BCPS []  Heide Guile, Pharm.D., BCPS []  Alycia Rossetti, Pharm.D., BCPS [x]  [place], [place].D., BCPS, AAHIVP []  Legrand Como, Pharm.D., BCPS, AAHIVP []  Juliene Pina, Pharm.D.  Positive urine culture Treated with Cipro, organism sensitive to the same and no further patient follow-up is required at this time.  Terry Bryant 07/02/2013, 11:53 AM

## 2013-12-13 ENCOUNTER — Encounter (HOSPITAL_BASED_OUTPATIENT_CLINIC_OR_DEPARTMENT_OTHER): Payer: Self-pay

## 2013-12-13 ENCOUNTER — Emergency Department (HOSPITAL_BASED_OUTPATIENT_CLINIC_OR_DEPARTMENT_OTHER)
Admission: EM | Admit: 2013-12-13 | Discharge: 2013-12-13 | Disposition: A | Discharge status: Home / Self Care | Payer: BC Managed Care – PPO | Attending: Emergency Medicine | Admitting: Emergency Medicine

## 2013-12-13 DIAGNOSIS — Z3202 Encounter for pregnancy test, result negative: Secondary | ICD-10-CM | POA: Diagnosis not present

## 2013-12-13 DIAGNOSIS — Z862 Personal history of diseases of the blood and blood-forming organs and certain disorders involving the immune mechanism: Secondary | ICD-10-CM | POA: Insufficient documentation

## 2013-12-13 DIAGNOSIS — Z880 Allergy status to penicillin: Secondary | ICD-10-CM | POA: Insufficient documentation

## 2013-12-13 DIAGNOSIS — R5381 Other malaise: Secondary | ICD-10-CM

## 2013-12-13 DIAGNOSIS — I10 Essential (primary) hypertension: Secondary | ICD-10-CM | POA: Insufficient documentation

## 2013-12-13 DIAGNOSIS — Z87448 Personal history of other diseases of urinary system: Secondary | ICD-10-CM | POA: Diagnosis not present

## 2013-12-13 DIAGNOSIS — Z8701 Personal history of pneumonia (recurrent): Secondary | ICD-10-CM | POA: Diagnosis not present

## 2013-12-13 DIAGNOSIS — Z8619 Personal history of other infectious and parasitic diseases: Secondary | ICD-10-CM | POA: Insufficient documentation

## 2013-12-13 DIAGNOSIS — R42 Dizziness and giddiness: Secondary | ICD-10-CM | POA: Diagnosis present

## 2013-12-13 DIAGNOSIS — Z872 Personal history of diseases of the skin and subcutaneous tissue: Secondary | ICD-10-CM | POA: Insufficient documentation

## 2013-12-13 DIAGNOSIS — R5383 Other fatigue: Secondary | ICD-10-CM | POA: Insufficient documentation

## 2013-12-13 DIAGNOSIS — Z79899 Other long term (current) drug therapy: Secondary | ICD-10-CM | POA: Diagnosis not present

## 2013-12-13 LAB — URINALYSIS, ROUTINE W REFLEX MICROSCOPIC
Bilirubin Urine: NEGATIVE
GLUCOSE, UA: NEGATIVE mg/dL
Ketones, ur: 15 mg/dL — AB
Nitrite: NEGATIVE
PH: 6.5 (ref 5.0–8.0)
Protein, ur: NEGATIVE mg/dL
Specific Gravity, Urine: 1.016 (ref 1.005–1.030)
Urobilinogen, UA: 1 mg/dL (ref 0.0–1.0)

## 2013-12-13 LAB — CBC WITH DIFFERENTIAL/PLATELET
Basophils Absolute: 0 10*3/uL (ref 0.0–0.1)
Basophils Relative: 1 % (ref 0–1)
Eosinophils Absolute: 0.2 10*3/uL (ref 0.0–0.7)
Eosinophils Relative: 4 % (ref 0–5)
HCT: 40.9 % (ref 36.0–46.0)
HEMOGLOBIN: 13.2 g/dL (ref 12.0–15.0)
LYMPHS ABS: 1.8 10*3/uL (ref 0.7–4.0)
Lymphocytes Relative: 42 % (ref 12–46)
MCH: 26.2 pg (ref 26.0–34.0)
MCHC: 32.3 g/dL (ref 30.0–36.0)
MCV: 81.2 fL (ref 78.0–100.0)
Monocytes Absolute: 0.4 10*3/uL (ref 0.1–1.0)
Monocytes Relative: 9 % (ref 3–12)
NEUTROS ABS: 1.9 10*3/uL (ref 1.7–7.7)
NEUTROS PCT: 44 % (ref 43–77)
Platelets: 261 10*3/uL (ref 150–400)
RBC: 5.04 MIL/uL (ref 3.87–5.11)
RDW: 12.2 % (ref 11.5–15.5)
WBC: 4.2 10*3/uL (ref 4.0–10.5)

## 2013-12-13 LAB — URINE MICROSCOPIC-ADD ON

## 2013-12-13 LAB — BASIC METABOLIC PANEL
Anion gap: 14 (ref 5–15)
BUN: 11 mg/dL (ref 6–23)
CO2: 25 meq/L (ref 19–32)
Calcium: 10 mg/dL (ref 8.4–10.5)
Chloride: 99 mEq/L (ref 96–112)
Creatinine, Ser: 0.8 mg/dL (ref 0.50–1.10)
GFR calc Af Amer: >90 mL/min (ref >90)
GFR calc non Af Amer: >90 mL/min (ref >90)
GLUCOSE: 89 mg/dL (ref 70–99)
POTASSIUM: 3.7 meq/L (ref 3.7–5.3)
Sodium: 138 mEq/L (ref 137–147)

## 2013-12-13 LAB — PREGNANCY, URINE: Preg Test, Ur: NEGATIVE

## 2013-12-13 NOTE — Discharge Instructions (Signed)
Dizziness °Dizziness is a common problem. It is a feeling of unsteadiness or light-headedness. You may feel like you are about to faint. Dizziness can lead to injury if you stumble or fall. A person of any age group can suffer from dizziness, but dizziness is more common in older adults. °CAUSES  °Dizziness can be caused by many different things, including: °· Middle ear problems. °· Standing for too long. °· Infections. °· An allergic reaction. °· Aging. °· An emotional response to something, such as the sight of blood. °· Side effects of medicines. °· Tiredness. °· Problems with circulation or blood pressure. °· Excessive use of alcohol or medicines, or illegal drug use. °· Breathing too fast (hyperventilation). °· An irregular heart rhythm (arrhythmia). °· A low red blood cell count (anemia). °· Pregnancy. °· Vomiting, diarrhea, fever, or other illnesses that cause body fluid loss (dehydration). °· Diseases or conditions such as Parkinson's disease, high blood pressure (hypertension), diabetes, and thyroid problems. °· Exposure to extreme heat. °DIAGNOSIS  °Your health care provider will ask about your symptoms, perform a physical exam, and perform an electrocardiogram (ECG) to record the electrical activity of your heart. Your health care provider may also perform other heart or blood tests to determine the cause of your dizziness. These may include: °· Transthoracic echocardiogram (TTE). During echocardiography, sound waves are used to evaluate how blood flows through your heart. °· Transesophageal echocardiogram (TEE). °· Cardiac monitoring. This allows your health care provider to monitor your heart rate and rhythm in real time. °· Holter monitor. This is a portable device that records your heartbeat and can help diagnose heart arrhythmias. It allows your health care provider to track your heart activity for several days if needed. °· Stress tests by exercise or by giving medicine that makes the heart beat  faster. °TREATMENT  °Treatment of dizziness depends on the cause of your symptoms and can vary greatly. °HOME CARE INSTRUCTIONS  °· Drink enough fluids to keep your urine clear or pale yellow. This is especially important in very hot weather. In older adults, it is also important in cold weather. °· Take your medicine exactly as directed if your dizziness is caused by medicines. When taking blood pressure medicines, it is especially important to get up slowly. °· Rise slowly from chairs and steady yourself until you feel okay. °· In the morning, first sit up on the side of the bed. When you feel okay, stand slowly while holding onto something until you know your balance is fine. °· Move your legs often if you need to stand in one place for a long time. Tighten and relax your muscles in your legs while standing. °· Have someone stay with you for 1-2 days if dizziness continues to be a problem. Do this until you feel you are well enough to stay alone. Have the person call your health care provider if he or she notices changes in you that are concerning. °· Do not drive or use heavy machinery if you feel dizzy. °· Do not drink alcohol. °SEEK IMMEDIATE MEDICAL CARE IF:  °· Your dizziness or light-headedness gets worse. °· You feel nauseous or vomit. °· You have problems talking, walking, or using your arms, hands, or legs. °· You feel weak. °· You are not thinking clearly or you have trouble forming sentences. It may take a friend or family member to notice this. °· You have chest pain, abdominal pain, shortness of breath, or sweating. °· Your vision changes. °· You notice   any bleeding. °· You have side effects from medicine that seems to be getting worse rather than better. °MAKE SURE YOU:  °· Understand these instructions. °· Will watch your condition. °· Will get help right away if you are not doing well or get worse. °Document Released: 07/25/2000 Document Revised: 02/03/2013 Document Reviewed: 08/18/2010 °[employer]®  Patient Information ©2015 [employer]. This information is not intended to replace advice given to you by your health care provider. Make sure you discuss any questions you have with your health care provider. ° °Weakness °Weakness is a lack of strength. It may be felt all over the body (generalized) or in one specific part of the body (focal). Some causes of weakness can be serious. You may need further medical evaluation, especially if you are elderly or you have a history of immunosuppression (such as chemotherapy or HIV), kidney disease, heart disease, or diabetes. °CAUSES  °Weakness can be caused by many different things, including: °· Infection. °· Physical exhaustion. °· Internal bleeding or other blood loss that results in a lack of red blood cells (anemia). °· Dehydration. This cause is more common in elderly people. °· Side effects or electrolyte abnormalities from medicines, such as pain medicines or sedatives. °· Emotional distress, anxiety, or depression. °· Circulation problems, especially severe peripheral arterial disease. °· Heart disease, such as rapid atrial fibrillation, bradycardia, or heart failure. °· Nervous system disorders, such as Guillain-Barré syndrome, multiple sclerosis, or stroke. °DIAGNOSIS  °To find the cause of your weakness, your caregiver will take your history and perform a physical exam. Lab tests or X-rays may also be ordered, if needed. °TREATMENT  °Treatment of weakness depends on the cause of your symptoms and can vary greatly. °HOME CARE INSTRUCTIONS  °· Rest as needed. °· Eat a well-balanced diet. °· Try to get some exercise every day. °· Only take over-the-counter or prescription medicines as directed by your caregiver. °SEEK MEDICAL CARE IF:  °· Your weakness seems to be getting worse or spreads to other parts of your body. °· You develop new aches or pains. °SEEK IMMEDIATE MEDICAL CARE IF:  °· You cannot perform your normal daily activities, such as getting dressed  and feeding yourself. °· You cannot walk up and down stairs, or you feel exhausted when you do so. °· You have shortness of breath or chest pain. °· You have difficulty moving parts of your body. °· You have weakness in only one area of the body or on only one side of the body. °· You have a fever. °· You have trouble speaking or swallowing. °· You cannot control your bladder or bowel movements. °· You have black or bloody vomit or stools. °MAKE SURE YOU: °· Understand these instructions. °· Will watch your condition. °· Will get help right away if you are not doing well or get worse. °Document Released: 01/29/2005 Document Revised: 07/31/2011 Document Reviewed: 03/30/2011 °Exit[employer]® Patient Information ©2015 [employer]. This information is not intended to replace advice given to you by your health care provider. Make sure you discuss any questions you have with your health care provider. ° °

## 2013-12-13 NOTE — ED Notes (Signed)
Patient reports that she had dizziness yesterday and noticed that her BP was running higher than usual, also complains of generalized headache, alert and oriented

## 2013-12-13 NOTE — ED Provider Notes (Signed)
CSN: [id number]     Arrival date & time 12/13/13  1024 History   First MD Initiated Contact with Patient 12/13/13 1310     Chief Complaint  Patient presents with  . Dizziness     (Consider location/radiation/quality/duration/timing/severity/associated sxs/prior Treatment) HPI  The patient reports that she's had some general fatigue and weakness over the past couple of days. She reports that her blood pressure was running higher than usual. He did feel that she has some generalized headache as well. It was mild in nature. No associated nausea or vomiting. No associated visual dysfunction. She denies that she's had abdominal pain. No urinary symptoms. No chest pain no shortness of breath. The patient reports sometimes she has heavy periods. She reports that she has had anemia in the past but never required blood transfusion. The patient poor she's been eating and drinking as per usual. No syncope or near syncope.  Past Medical History  Diagnosis Date  . Hypertension   . 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 Endometriosis of colon 2009  . Pelvic adhesions   . Pneumonia AGE 38  . Blood transfusion 02-16-11    '05 with ovary surgery  . Preterm labor 2004  . Infection     OCC  . Infection AGE 38    CHLAMYDIA  . Anemia     TAKES FE OCC  . Fibroids 2004  . Hypertension 2007    [facility]   ON MED  . Eczema   . Blood transfusion without reported diagnosis    Past Surgical History  Procedure Laterality Date  . Cesarean section    . Breast cyst excision  02/22/2011    Procedure: CYST EXCISION BREAST;  Surgeon: Haywood Lasso, MD;  Location: [facility];  Service: General;  Laterality: Left;  Removal Left Breast Mass  . Salpyngooophorectomy      LSO 2005 for TOA  . Cesarean section with bilateral tubal ligation  01/28/2012    Procedure: CESAREAN SECTION WITH BILATERAL TUBAL LIGATION;  Surgeon: Alwyn Pea, MD;  Location: [place] [facility];  Service: Obstetrics;  Laterality: Bilateral;  Repeat  C/S - POSSIBLE BTL   Family History  Problem Relation Age of Onset  . Heart disease Mother   . Diabetes Father   . Heart disease Father     DIED AT 9  . Hypertension Maternal Grandmother   . Stroke Maternal Grandmother   . Heart disease Maternal Grandfather   . Diabetes Maternal Grandfather   . Lupus Sister   . Asthma Daughter    History  Substance Use Topics  . Smoking status: Never Smoker   . Smokeless tobacco: Never Used  . Alcohol Use: No   OB History    Gravida Para Term Preterm AB TAB SAB Ectopic Multiple Living   2 2 2       2      Review of Systems  10 Systems reviewed and are negative for acute change except as noted in the HPI.   Allergies  Bee venom and Penicillins  Home Medications   Prior to Admission medications   Medication Sig Start Date End Date Taking? Authorizing Provider  levonorgestrel (MIRENA) 20 MCG/24HR IUD 1 each by Intrauterine route once.    Historical Provider, MD  NIFEdipine (PROCARDIA-XL/ADALAT-CC/NIFEDICAL-XL) 30 MG 24 hr tablet Take 30 mg by mouth daily.     Historical Provider, MD   BP 130/81 mmHg  Pulse 73  Temp(Src) 98.1 F (36.7 C) (Oral)  Resp 18  Wt  170 lb (77.111 kg)  SpO2 99% Physical Exam  Constitutional: She is oriented to person, place, and time. She appears well-developed and well-nourished.  HENT:  Head: Normocephalic and atraumatic.  Eyes: EOM are normal. Pupils are equal, round, and reactive to light.  Neck: Neck supple.  Cardiovascular: Normal rate, regular rhythm, normal heart sounds and intact distal pulses.   Pulmonary/Chest: Effort normal and breath sounds normal.  Abdominal: Soft. Bowel sounds are normal. She exhibits no distension. There is no tenderness.  Musculoskeletal: Normal range of motion. She exhibits no edema.  Neurological: She is alert and oriented to person, place, and time. She has normal strength. Coordination normal. GCS eye subscore is 4. GCS verbal subscore is 5. GCS motor subscore is 6.   Skin: Skin is warm, dry and intact.  Psychiatric: She has a normal mood and affect.      ED Course  Procedures (including critical care time) Labs Review Labs Reviewed  URINALYSIS, ROUTINE W REFLEX MICROSCOPIC - Abnormal; Notable for the following:    Hgb urine dipstick TRACE (*)    Ketones, ur 15 (*)    Leukocytes, UA TRACE (*)    All other components within normal limits  URINE MICROSCOPIC-ADD ON - Abnormal; Notable for the following:    Bacteria, UA FEW (*)    All other components within normal limits  CBC WITH DIFFERENTIAL  BASIC METABOLIC PANEL  PREGNANCY, URINE    Imaging Review No results found.   EKG Interpretation None      MDM   Final diagnoses:  Malaise and fatigue  Dizziness   At this point there is no evidence of acute infectious illness. This does not appear to be an acute cardiac or pulmonary dysfunction. The patient is well in appearance and diagnostic studies do not indicate anemia or electrolyte abnormality or dehydration. The patient's symptoms are nonspecific in her blood pressure here is within normal limits. At this point feel she is safe to continue her regularly prescribed medications and follow up with her family physician for recheck.    Charlesetta Shanks, MD 12/13/13 1630

## 2013-12-14 ENCOUNTER — Encounter (HOSPITAL_BASED_OUTPATIENT_CLINIC_OR_DEPARTMENT_OTHER): Payer: Self-pay

## 2014-02-13 ENCOUNTER — Emergency Department (HOSPITAL_BASED_OUTPATIENT_CLINIC_OR_DEPARTMENT_OTHER)
Admission: EM | Admit: 2014-02-13 | Discharge: 2014-02-14 | Disposition: A | Discharge status: Home / Self Care | Payer: BC Managed Care – PPO | Attending: Emergency Medicine | Admitting: Emergency Medicine

## 2014-02-13 ENCOUNTER — Encounter (HOSPITAL_BASED_OUTPATIENT_CLINIC_OR_DEPARTMENT_OTHER): Payer: Self-pay | Admitting: Emergency Medicine

## 2014-02-13 ENCOUNTER — Emergency Department (HOSPITAL_BASED_OUTPATIENT_CLINIC_OR_DEPARTMENT_OTHER): Payer: BC Managed Care – PPO

## 2014-02-13 DIAGNOSIS — Z862 Personal history of diseases of the blood and blood-forming organs and certain disorders involving the immune mechanism: Secondary | ICD-10-CM | POA: Insufficient documentation

## 2014-02-13 DIAGNOSIS — Z8742 Personal history of other diseases of the female genital tract: Secondary | ICD-10-CM | POA: Insufficient documentation

## 2014-02-13 DIAGNOSIS — Z8701 Personal history of pneumonia (recurrent): Secondary | ICD-10-CM | POA: Diagnosis not present

## 2014-02-13 DIAGNOSIS — R079 Chest pain, unspecified: Secondary | ICD-10-CM | POA: Insufficient documentation

## 2014-02-13 DIAGNOSIS — Z8619 Personal history of other infectious and parasitic diseases: Secondary | ICD-10-CM | POA: Insufficient documentation

## 2014-02-13 DIAGNOSIS — Z880 Allergy status to penicillin: Secondary | ICD-10-CM | POA: Diagnosis not present

## 2014-02-13 DIAGNOSIS — Z872 Personal history of diseases of the skin and subcutaneous tissue: Secondary | ICD-10-CM | POA: Diagnosis not present

## 2014-02-13 DIAGNOSIS — Z79899 Other long term (current) drug therapy: Secondary | ICD-10-CM | POA: Insufficient documentation

## 2014-02-13 DIAGNOSIS — E876 Hypokalemia: Secondary | ICD-10-CM | POA: Insufficient documentation

## 2014-02-13 DIAGNOSIS — I10 Essential (primary) hypertension: Secondary | ICD-10-CM | POA: Insufficient documentation

## 2014-02-13 LAB — CBC
HEMATOCRIT: 39.4 % (ref 36.0–46.0)
Hemoglobin: 12.8 g/dL (ref 12.0–15.0)
MCH: 26.5 pg (ref 26.0–34.0)
MCHC: 32.5 g/dL (ref 30.0–36.0)
MCV: 81.6 fL (ref 78.0–100.0)
Platelets: 245 10*3/uL (ref 150–400)
RBC: 4.83 MIL/uL (ref 3.87–5.11)
RDW: 12.4 % (ref 11.5–15.5)
WBC: 4.8 10*3/uL (ref 4.0–10.5)

## 2014-02-13 LAB — COMPREHENSIVE METABOLIC PANEL
ALT: 8 U/L (ref 0–35)
AST: 14 U/L (ref 0–37)
Albumin: 4.3 g/dL (ref 3.5–5.2)
Alkaline Phosphatase: 49 U/L (ref 39–117)
Anion gap: 7 (ref 5–15)
BILIRUBIN TOTAL: 0.4 mg/dL (ref 0.3–1.2)
BUN: 10 mg/dL (ref 6–23)
CALCIUM: 9 mg/dL (ref 8.4–10.5)
CO2: 26 mmol/L (ref 19–32)
Chloride: 103 mEq/L (ref 96–112)
Creatinine, Ser: 0.71 mg/dL (ref 0.50–1.10)
Glucose, Bld: 126 mg/dL — ABNORMAL HIGH (ref 70–99)
POTASSIUM: 3.3 mmol/L — AB (ref 3.5–5.1)
Sodium: 136 mmol/L (ref 135–145)
TOTAL PROTEIN: 7.7 g/dL (ref 6.0–8.3)

## 2014-02-13 LAB — TROPONIN I: Troponin I: <0.03 ng/mL (ref <0.031)

## 2014-02-13 NOTE — ED Notes (Signed)
Py reports on set chest pain / discomfort mid day yesterday intermittant and got worse today

## 2014-02-13 NOTE — ED Provider Notes (Signed)
CSN: [id number]     Arrival date & time 02/13/14  2150 History  This chart was scribed for Sharyon Cable, MD by Jeanell Sparrow, ED Scribe. This patient was seen in room MH08/MH08 and the patient's care was started at 11:27 PM.   Chief Complaint  Patient presents with  . Chest Pain   Patient is a 39 y.o. female presenting with chest pain. The history is provided by the patient. No language interpreter was used.  Chest Pain Pain location:  L chest Pain radiates to:  Upper back and L arm Pain radiates to the back: yes   Pain severity:  Moderate Duration:  2 days Timing:  Constant Progression:  Unchanged Chronicity:  Recurrent Context: movement   Relieved by:  None tried Worsened by:  Nothing tried Ineffective treatments:  None tried Associated symptoms: no abdominal pain, no fever and no shortness of breath   Risk factors: no coronary artery disease, no prior DVT/PE and no smoking    HPI Comments: Terry Bryant is a 39 y.o. female who presents to the Emergency Department complaining of constant moderate left sided chest pain that started 2 days ago. She reports that she was cooking at home when the pain started. She states that past 12 hours the pain has been constant, but intermittent all other times. She states that the pain radiates to her back and left arm. She reports that there are modifying factors. She states that she has a prior hx of chest pain but no h/o CAD. She reports that she has a family hx of heart disease, but no hx of smoking. She denies any leg swelling, fever, vomiting, SOB, hemoptysis, or abdominal pain.   She reports she took ASA at home prior to arrival  Past Medical History  Diagnosis Date  . Hypertension   . 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 Endometriosis of colon 2009  . Pelvic adhesions   . Pneumonia AGE 49  . Blood transfusion 02-16-11    '05 with ovary surgery  . Preterm labor 2004  . Infection     OCC  . Infection AGE 25    CHLAMYDIA   . Anemia     TAKES FE OCC  . Fibroids 2004  . Hypertension 2007    DR [facility]   ON MED  . Eczema   . Blood transfusion without reported diagnosis    Past Surgical History  Procedure Laterality Date  . Cesarean section    . Breast cyst excision  02/22/2011    Procedure: CYST EXCISION BREAST;  Surgeon: Haywood Lasso, MD;  Location: [facility];  Service: General;  Laterality: Left;  Removal Left Breast Mass  . Salpyngooophorectomy      LSO 2005 for TOA  . Cesarean section with bilateral tubal ligation  01/28/2012    Procedure: CESAREAN SECTION WITH BILATERAL TUBAL LIGATION;  Surgeon: Alwyn Pea, MD;  Location: [place] [facility];  Service: Obstetrics;  Laterality: Bilateral;  Repeat C/S - POSSIBLE BTL   Family History  Problem Relation Age of Onset  . Heart disease Mother   . Diabetes Father   . Heart disease Father     DIED AT 13  . Hypertension Maternal Grandmother   . Stroke Maternal Grandmother   . Heart disease Maternal Grandfather   . Diabetes Maternal Grandfather   . Lupus Sister   . Asthma Daughter    History  Substance Use Topics  . Smoking status: Never Smoker   . Smokeless tobacco:  Never Used  . Alcohol Use: No   OB History    Gravida Para Term Preterm AB TAB SAB Ectopic Multiple Living   2 2 2       2      Review of Systems  Constitutional: Negative for fever.  Respiratory: Negative for shortness of breath.   Cardiovascular: Positive for chest pain. Negative for leg swelling.  Gastrointestinal: Negative for abdominal pain.  All other systems reviewed and are negative.   Allergies  Bee venom and Penicillins  Home Medications   Prior to Admission medications   Medication Sig Start Date End Date Taking? Authorizing Provider  levonorgestrel (MIRENA) 20 MCG/24HR IUD 1 each by Intrauterine route once.    Historical Provider, MD  NIFEdipine (PROCARDIA-XL/ADALAT-CC/NIFEDICAL-XL) 30 MG 24 hr tablet Take 30 mg by mouth daily.     Historical Provider, MD   Pulse 67   Temp(Src) 98.2 F (36.8 C) (Oral)  Resp 18  Ht 5\' 6"  (1.676 m)  Wt 170 lb (77.111 kg)  BMI 27.45 kg/m2  SpO2 98% Physical Exam  Nursing note and vitals reviewed. CONSTITUTIONAL: Well developed/well nourished HEAD: Normocephalic/atraumatic EYES: EOMI/PERRL ENMT: Mucous membranes moist NECK: supple no meningeal signs SPINE/BACK:entire spine nontender CV: S1/S2 noted, no murmurs/rubs/gallops noted LUNGS: Lungs are clear to auscultation bilaterally, no apparent distress ABDOMEN: soft, nontender, no rebound or guarding, bowel sounds noted throughout abdomen GU:no cva tenderness NEURO: Pt is awake/alert/appropriate, moves all extremitiesx4.  No facial droop.   EXTREMITIES: pulses normal/equal, full ROM, no edema or calf TTP SKIN: warm, color normal PSYCH: no abnormalities of mood noted, alert and oriented to situation  ED Course  Procedures  DIAGNOSTIC STUDIES: Oxygen Saturation is 98% on RA, normal by my interpretation.    COORDINATION OF CARE: 11:31 PM- Pt advised of plan for treatment which includes radiology and labs and pt agrees.  Pt monitored for several hours.  No worsening symptoms Repeat troponin negative Pt with nonspecific T waves changes but no definitive signs of ischemia Her CP history is very atypical HEART score = 2 for risk factors and she does not have completely normal EKG I don't feel further emergent testing required I doubt PE/Dissection at this time Advised PCP followup as she does have family h/o CAD and may benefit from stress imaging Pt agreeable with plan We discussed strict return precautions  Labs Review Labs Reviewed  COMPREHENSIVE METABOLIC PANEL - Abnormal; Notable for the following:    Potassium 3.3 (*)    Glucose, Bld 126 (*)    All other components within normal limits  CBC  TROPONIN I  TROPONIN I    Imaging Review Dg Chest 2 View  02/14/2014   CLINICAL DATA:  Left-sided chest pain beginning 2 days ago and radiating to the back  and left arm.  EXAM: CHEST  2 VIEW  COMPARISON:  11/01/2012  FINDINGS: The heart size and mediastinal contours are within normal limits. Both lungs are clear. The visualized skeletal structures are unremarkable.  IMPRESSION: No active cardiopulmonary disease.   Electronically Signed   By: Lucienne Capers M.D.   On: 02/14/2014 00:27     EKG Interpretation   Date/Time:  Saturday February 13 2014 21:59:47 EST Ventricular Rate:  68 PR Interval:  130 QRS Duration: 90 QT Interval:  388 QTC Calculation: 412 R Axis:   69 Text Interpretation:  Normal sinus rhythm Nonspecific T wave abnormality  Abnormal ECG changes noted from prior Confirmed by Christy Gentles  MD, [place]  [phone]) on 02/13/2014 11:26:09  PM      Date: 02/14/2014 0201am  Rate: 61  Rhythm: normal sinus rhythm  QRS Axis: normal  Intervals: normal  ST/T Wave abnormalities: nonspecific T wave changes  Conduction Disutrbances:none  Narrative Interpretation:   Old EKG Reviewed: unchanged    MDM   Final diagnoses:  Chest pain  Chest pain, unspecified chest pain type  Hypokalemia    Nursing notes including past medical history and social history reviewed and considered in documentation xrays/imaging reviewed by myself and considered during evaluation Labs/vital reviewed myself and considered during evaluation  I personally performed the services described in this documentation, which was scribed in my presence. The recorded information has been reviewed and is accurate.      Sharyon Cable, MD 02/14/14 [phone]

## 2014-02-14 LAB — TROPONIN I: Troponin I: <0.03 ng/mL (ref <0.031)

## 2014-02-14 MED ORDER — POTASSIUM CHLORIDE CRYS ER 20 MEQ PO TBCR
40.0000 meq | EXTENDED_RELEASE_TABLET | Freq: Once | ORAL | Status: AC
  Filled 2014-02-14: qty 2

## 2014-02-14 NOTE — Discharge Instructions (Signed)

## 2014-02-14 NOTE — ED Notes (Signed)
I completed the ECG and gave the copy to Dr. Christy Gentles.

## 2014-06-10 ENCOUNTER — Encounter (HOSPITAL_BASED_OUTPATIENT_CLINIC_OR_DEPARTMENT_OTHER): Payer: Self-pay | Admitting: *Deleted

## 2014-06-10 ENCOUNTER — Emergency Department (HOSPITAL_BASED_OUTPATIENT_CLINIC_OR_DEPARTMENT_OTHER)
Admission: EM | Admit: 2014-06-10 | Discharge: 2014-06-10 | Disposition: A | Discharge status: Home / Self Care | Payer: BC Managed Care – PPO | Attending: Emergency Medicine | Admitting: Emergency Medicine

## 2014-06-10 DIAGNOSIS — F410 Panic disorder [episodic paroxysmal anxiety] without agoraphobia: Secondary | ICD-10-CM | POA: Insufficient documentation

## 2014-06-10 DIAGNOSIS — Z880 Allergy status to penicillin: Secondary | ICD-10-CM | POA: Insufficient documentation

## 2014-06-10 DIAGNOSIS — I10 Essential (primary) hypertension: Secondary | ICD-10-CM | POA: Diagnosis not present

## 2014-06-10 DIAGNOSIS — Z79899 Other long term (current) drug therapy: Secondary | ICD-10-CM | POA: Diagnosis not present

## 2014-06-10 DIAGNOSIS — M79602 Pain in left arm: Secondary | ICD-10-CM | POA: Insufficient documentation

## 2014-06-10 DIAGNOSIS — Z87448 Personal history of other diseases of urinary system: Secondary | ICD-10-CM | POA: Diagnosis not present

## 2014-06-10 DIAGNOSIS — R079 Chest pain, unspecified: Secondary | ICD-10-CM | POA: Diagnosis present

## 2014-06-10 DIAGNOSIS — Z8701 Personal history of pneumonia (recurrent): Secondary | ICD-10-CM | POA: Insufficient documentation

## 2014-06-10 DIAGNOSIS — Z872 Personal history of diseases of the skin and subcutaneous tissue: Secondary | ICD-10-CM | POA: Diagnosis not present

## 2014-06-10 DIAGNOSIS — Z86018 Personal history of other benign neoplasm: Secondary | ICD-10-CM | POA: Insufficient documentation

## 2014-06-10 DIAGNOSIS — Z862 Personal history of diseases of the blood and blood-forming organs and certain disorders involving the immune mechanism: Secondary | ICD-10-CM | POA: Insufficient documentation

## 2014-06-10 DIAGNOSIS — Z8619 Personal history of other infectious and parasitic diseases: Secondary | ICD-10-CM | POA: Insufficient documentation

## 2014-06-10 LAB — TROPONIN I: Troponin I: <0.03 ng/mL (ref <0.031)

## 2014-06-10 NOTE — ED Notes (Signed)
Dr. Audie Pinto into room prior to RN assessment, see MD notes, pending orders.

## 2014-06-10 NOTE — ED Notes (Signed)
Pt reports substernal CP radiating into her (L) arm that started 1 hour PTA.  Denies SOB, N/V, diaphoresis.  Pt does report being lightheaded.

## 2014-06-10 NOTE — ED Provider Notes (Signed)
CSN: [id number]     Arrival date & time 06/10/14  2021 History  This chart was scribed for Leonard Schwartz, MD by Irene Pap, ED Scribe. This patient was seen in room MH07/MH07 and patient care was started at 8:48 PM.    Chief Complaint  Patient presents with  . Chest Pain   Patient is a 39 y.o. female presenting with chest pain. The history is provided by the patient. No language interpreter was used.  Chest Pain Associated symptoms: dizziness and numbness   Associated symptoms: no diaphoresis     HPI Comments: TENNESSEE HANLON is a 39 y.o. Female with a history of HTN who presents to the Emergency Department complaining of quick, sharp chest pain and numbing left arm pain onset 1 hour PTA.  She reports associated dizziness and that the substernal chest pain radiates to her left arm. She states that the arm pain started yesterday. She states that it mostly feels like numbness, and has had pain like this before. She states that she is supposed to have a nerve test done by the neurologist next week. She states that she took two tylenol to no relief. She states that she was having a panic attack and felt like she was about to pass out. She denies diaphoresis. She denies that movement worsens the pain. She states that she take Procardia for her HTN. She denies smoking, diabetes, or taking medication for high cholesterol. She reports that her father died at age 20 from MI.   Past Medical History  Diagnosis Date  . Hypertension   . 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 Endometriosis of colon 2009  . Pelvic adhesions   . Pneumonia AGE 75  . Blood transfusion 02-16-11    '05 with ovary surgery  . Preterm labor 2004  . Infection     OCC  . Infection AGE 62    CHLAMYDIA  . Anemia     TAKES FE OCC  . Fibroids 2004  . Hypertension 2007    DR [facility]   ON MED  . Eczema   . Blood transfusion without reported diagnosis    Past Surgical History  Procedure Laterality Date  . Cesarean  section    . Breast cyst excision  02/22/2011    Procedure: CYST EXCISION BREAST;  Surgeon: Haywood Lasso, MD;  Location: [facility];  Service: General;  Laterality: Left;  Removal Left Breast Mass  . Salpyngooophorectomy      LSO 2005 for TOA  . Cesarean section with bilateral tubal ligation  01/28/2012    Procedure: CESAREAN SECTION WITH BILATERAL TUBAL LIGATION;  Surgeon: Alwyn Pea, MD;  Location: [place] [facility];  Service: Obstetrics;  Laterality: Bilateral;  Repeat C/S - POSSIBLE BTL   Family History  Problem Relation Age of Onset  . Heart disease Mother   . Diabetes Father   . Heart disease Father     DIED AT 31  . Hypertension Maternal Grandmother   . Stroke Maternal Grandmother   . Heart disease Maternal Grandfather   . Diabetes Maternal Grandfather   . Lupus Sister   . Asthma Daughter    History  Substance Use Topics  . Smoking status: Never Smoker   . Smokeless tobacco: Never Used  . Alcohol Use: No   OB History    Gravida Para Term Preterm AB TAB SAB Ectopic Multiple Living   2 2 2       2      Review of Systems  Constitutional: Negative for diaphoresis.  Cardiovascular: Positive for chest pain.  Musculoskeletal: Positive for arthralgias.  Neurological: Positive for dizziness and numbness.  All other systems reviewed and are negative.  Allergies  Bee venom and Penicillins  Home Medications   Prior to Admission medications   Medication Sig Start Date End Date Taking? Authorizing Provider  levonorgestrel (MIRENA) 20 MCG/24HR IUD 1 each by Intrauterine route once.    Historical Provider, MD  NIFEdipine (PROCARDIA-XL/ADALAT-CC/NIFEDICAL-XL) 30 MG 24 hr tablet Take 30 mg by mouth daily.     Historical Provider, MD   BP 149/87 mmHg  Pulse 67  Temp(Src) 97.8 F (36.6 C) (Oral)  Resp 18  Ht 5\' 6"  (1.676 m)  Wt 170 lb (77.111 kg)  BMI 27.45 kg/m2  SpO2 98%  Physical Exam Physical Exam  Nursing note and vitals reviewed. Constitutional: She is oriented to  person, place, and time. She appears well-developed and well-nourished. No distress.  HENT:  Head: Normocephalic and atraumatic.  Eyes: Pupils are equal, round, and reactive to light.  Neck: Normal range of motion.  Cardiovascular: Normal rate and intact distal pulses.  No significant murmurs or gallops   Pulmonary/Chest: No respiratory distress.  Breath sounds equal with no rales rhonchi  Abdominal: Normal appearance. She exhibits no distension.  Musculoskeletal: Normal range of motion.  Neurological: She is alert and oriented to person, place, and time. No cranial nerve deficit.  Skin: Skin is warm and dry. No rash noted.  Psychiatric: She has a normal mood and affect. Her behavior is normal.   ED Course  Procedures (including critical care time) DIAGNOSTIC STUDIES: Oxygen Saturation is 98% on room air, normal by my interpretation.    COORDINATION OF CARE: 9:06 PM-Discussed treatment plan which includes labs with pt at bedside and pt agreed to plan.   Labs Review Labs Reviewed  TROPONIN I    Imaging Review No results found.  EKG Interpretation Date/Time:  Thursday June 10 2014 20:28:10 EDT Ventricular Rate:  66 PR Interval:  134 QRS Duration: 84 QT Interval:  378 QTC Calculation: 396 R Axis:   80 Text Interpretation:  Normal sinus rhythm Normal ECG Confirmed by Terrisa Curfman   MD, Jaelyne Deeg ([id number]) on 06/10/2014 8:31:45 PM      MDM   Final diagnoses:  Pain of left upper extremity   I personally performed the services described in this documentation, which was scribed in my presence. The recorded information has been reviewed and considered.      Leonard Schwartz, MD 06/10/14 2155

## 2014-06-10 NOTE — ED Notes (Signed)
C/o L arm and CP, denies other sx, better now than previously, denies other sx, pt alert, NAD, calm, laughing, no dyspnea noted.

## 2014-06-10 NOTE — Discharge Instructions (Signed)

## 2014-12-24 ENCOUNTER — Encounter (HOSPITAL_BASED_OUTPATIENT_CLINIC_OR_DEPARTMENT_OTHER): Payer: Self-pay | Admitting: Emergency Medicine

## 2014-12-24 ENCOUNTER — Emergency Department (HOSPITAL_BASED_OUTPATIENT_CLINIC_OR_DEPARTMENT_OTHER): Payer: BC Managed Care – PPO

## 2014-12-24 ENCOUNTER — Emergency Department (HOSPITAL_BASED_OUTPATIENT_CLINIC_OR_DEPARTMENT_OTHER)
Admission: EM | Admit: 2014-12-24 | Discharge: 2014-12-25 | Disposition: A | Discharge status: Home / Self Care | Payer: BC Managed Care – PPO | Attending: Emergency Medicine | Admitting: Emergency Medicine

## 2014-12-24 DIAGNOSIS — Z3202 Encounter for pregnancy test, result negative: Secondary | ICD-10-CM | POA: Insufficient documentation

## 2014-12-24 DIAGNOSIS — K219 Gastro-esophageal reflux disease without esophagitis: Secondary | ICD-10-CM | POA: Diagnosis not present

## 2014-12-24 DIAGNOSIS — Z872 Personal history of diseases of the skin and subcutaneous tissue: Secondary | ICD-10-CM | POA: Diagnosis not present

## 2014-12-24 DIAGNOSIS — Z87448 Personal history of other diseases of urinary system: Secondary | ICD-10-CM | POA: Diagnosis not present

## 2014-12-24 DIAGNOSIS — Z79899 Other long term (current) drug therapy: Secondary | ICD-10-CM | POA: Insufficient documentation

## 2014-12-24 DIAGNOSIS — Z8619 Personal history of other infectious and parasitic diseases: Secondary | ICD-10-CM | POA: Diagnosis not present

## 2014-12-24 DIAGNOSIS — Z8701 Personal history of pneumonia (recurrent): Secondary | ICD-10-CM | POA: Insufficient documentation

## 2014-12-24 DIAGNOSIS — Z86018 Personal history of other benign neoplasm: Secondary | ICD-10-CM | POA: Insufficient documentation

## 2014-12-24 DIAGNOSIS — R7989 Other specified abnormal findings of blood chemistry: Secondary | ICD-10-CM | POA: Diagnosis not present

## 2014-12-24 DIAGNOSIS — Z880 Allergy status to penicillin: Secondary | ICD-10-CM | POA: Insufficient documentation

## 2014-12-24 DIAGNOSIS — Z862 Personal history of diseases of the blood and blood-forming organs and certain disorders involving the immune mechanism: Secondary | ICD-10-CM | POA: Diagnosis not present

## 2014-12-24 DIAGNOSIS — R079 Chest pain, unspecified: Secondary | ICD-10-CM | POA: Diagnosis present

## 2014-12-24 DIAGNOSIS — I10 Essential (primary) hypertension: Secondary | ICD-10-CM | POA: Diagnosis not present

## 2014-12-24 LAB — COMPREHENSIVE METABOLIC PANEL
ALT: 7 U/L — ABNORMAL LOW (ref 14–54)
ANION GAP: 6 (ref 5–15)
AST: 13 U/L — ABNORMAL LOW (ref 15–41)
Albumin: 4.1 g/dL (ref 3.5–5.0)
Alkaline Phosphatase: 46 U/L (ref 38–126)
BILIRUBIN TOTAL: 0.5 mg/dL (ref 0.3–1.2)
BUN: 16 mg/dL (ref 6–20)
CO2: 28 mmol/L (ref 22–32)
Calcium: 9.4 mg/dL (ref 8.9–10.3)
Chloride: 104 mmol/L (ref 101–111)
Creatinine, Ser: 1.15 mg/dL — ABNORMAL HIGH (ref 0.44–1.00)
GFR, EST NON AFRICAN AMERICAN: 60 mL/min — AB (ref >60)
Glucose, Bld: 90 mg/dL (ref 65–99)
POTASSIUM: 3.5 mmol/L (ref 3.5–5.1)
Sodium: 138 mmol/L (ref 135–145)
TOTAL PROTEIN: 7.3 g/dL (ref 6.5–8.1)

## 2014-12-24 LAB — CBC WITH DIFFERENTIAL/PLATELET
Basophils Absolute: 0 10*3/uL (ref 0.0–0.1)
Basophils Relative: 1 %
Eosinophils Absolute: 0.4 10*3/uL (ref 0.0–0.7)
Eosinophils Relative: 9 %
HEMATOCRIT: 37.1 % (ref 36.0–46.0)
Hemoglobin: 11.8 g/dL — ABNORMAL LOW (ref 12.0–15.0)
LYMPHS PCT: 30 %
Lymphs Abs: 1.4 10*3/uL (ref 0.7–4.0)
MCH: 25.9 pg — AB (ref 26.0–34.0)
MCHC: 31.8 g/dL (ref 30.0–36.0)
MCV: 81.5 fL (ref 78.0–100.0)
MONO ABS: 0.5 10*3/uL (ref 0.1–1.0)
MONOS PCT: 12 %
NEUTROS ABS: 2.3 10*3/uL (ref 1.7–7.7)
Neutrophils Relative %: 48 %
Platelets: 249 10*3/uL (ref 150–400)
RBC: 4.55 MIL/uL (ref 3.87–5.11)
RDW: 12.2 % (ref 11.5–15.5)
WBC: 4.6 10*3/uL (ref 4.0–10.5)

## 2014-12-24 LAB — TROPONIN I: Troponin I: <0.03 ng/mL (ref <0.031)

## 2014-12-24 LAB — PREGNANCY, URINE: PREG TEST UR: NEGATIVE

## 2014-12-24 MED ORDER — SUCRALFATE 1 GM/10ML PO SUSP: 1.0000 g | Freq: Three times a day (TID) | ORAL | Status: DC

## 2014-12-24 MED ORDER — ACETAMINOPHEN 500 MG PO TABS
1000.0000 mg | ORAL_TABLET | Freq: Once | ORAL | Status: AC
  Administered 2014-12-24: 1000 mg via ORAL
  Filled 2014-12-24: qty 2

## 2014-12-24 MED ORDER — SODIUM CHLORIDE 0.9 % IV BOLUS (SEPSIS)
1000.0000 mL | Freq: Once | INTRAVENOUS | Status: AC
  Administered 2014-12-24: 1000 mL via INTRAVENOUS

## 2014-12-24 MED ORDER — FAMOTIDINE 20 MG PO TABS: 20.0000 mg | ORAL_TABLET | Freq: Two times a day (BID) | ORAL | Status: DC

## 2014-12-24 MED ORDER — GI COCKTAIL ~~LOC~~
30.0000 mL | Freq: Once | ORAL | Status: AC
  Administered 2014-12-24: 30 mL via ORAL
  Filled 2014-12-24: qty 30

## 2014-12-24 NOTE — ED Notes (Signed)
Pt in c/o intermittent epigastric pain onset "a few hours ago". States took some aspirin with no relief.

## 2014-12-24 NOTE — ED Notes (Signed)
Pt c/o epigastric pain onset this pm,  Denies n/v, no sob, no radiation

## 2014-12-24 NOTE — ED Provider Notes (Signed)
CSN: [id number]     Arrival date & time 12/24/14  1952 History  By signing my name below, I, Stephania Fragmin, attest that this documentation has been prepared under the direction and in the presence of [employer], PA-C. Electronically Signed: Stephania Fragmin, ED Scribe. 12/24/2014. 9:28 PM.    Chief Complaint  Patient presents with  . Chest Pain   The history is provided by the patient. No language interpreter was used.    HPI Comments: Terry Bryant is a 39 y.o. female with a history of HTN, who presents to the Emergency Department complaining of intermittent, 8/10, sharp center chest pain that began at 5 PM, about 4 hours ago after she had eaten a hamburger. Patient had taken 4 tablets of aspirin 85mg  at about 6-7 PM with no relief to her pain. She denies a personal history of any cardiac conditions but states her father died at 79 from a MI. She denies a history of smoking, DM, HLD. She states she has seen cardiologist, Dr. Otho Perl, before and last had a cardiac stress test 2 years ago that was normal. Nothing makes her pain better, and nothing makes it worse. Palpation exacerbates her pain. She denies a history of any illicit drug use, recent travel, recent surgeries, leg pain or swelling, or a history of DVT. She does note rhinorrhea that she attributes to a URI/seasonal allergies. She denies SOB, nausea, vomiting, abdominal pain, or cough. Patient drove herself here today.   Past Medical History  Diagnosis Date  . Hypertension   . 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 Endometriosis of colon 2009  . Pelvic adhesions   . Pneumonia AGE 63  . Blood transfusion 02-16-11    '05 with ovary surgery  . Preterm labor 2004  . Infection     OCC  . Infection AGE 30    CHLAMYDIA  . Anemia     TAKES FE OCC  . Fibroids 2004  . Hypertension 2007    DR [facility]   ON MED  . Eczema   . Blood transfusion without reported diagnosis    Past Surgical History  Procedure Laterality Date  . Cesarean  section    . Breast cyst excision  02/22/2011    Procedure: CYST EXCISION BREAST;  Surgeon: Haywood Lasso, MD;  Location: [facility];  Service: General;  Laterality: Left;  Removal Left Breast Mass  . Salpyngooophorectomy      LSO 2005 for TOA  . Cesarean section with bilateral tubal ligation  01/28/2012    Procedure: CESAREAN SECTION WITH BILATERAL TUBAL LIGATION;  Surgeon: Alwyn Pea, MD;  Location: [place] [facility];  Service: Obstetrics;  Laterality: Bilateral;  Repeat C/S - POSSIBLE BTL   Family History  Problem Relation Age of Onset  . Heart disease Mother   . Diabetes Father   . Heart disease Father     DIED AT 60  . Hypertension Maternal Grandmother   . Stroke Maternal Grandmother   . Heart disease Maternal Grandfather   . Diabetes Maternal Grandfather   . Lupus Sister   . Asthma Daughter    Social History  Substance Use Topics  . Smoking status: Never Smoker   . Smokeless tobacco: Never Used  . Alcohol Use: No   OB History    Gravida Para Term Preterm AB TAB SAB Ectopic Multiple Living   2 2 2       2      Review of Systems  10 systems reviewed and  found to be negative, except as noted in the HPI.  Allergies  Bee venom and Penicillins  Home Medications   Prior to Admission medications   Medication Sig Start Date End Date Taking? Authorizing Provider  famotidine (PEPCID) 20 MG tablet Take 1 tablet (20 mg total) by mouth 2 (two) times daily. 12/24/14   Rafeef Lau, PA-C  levonorgestrel (MIRENA) 20 MCG/24HR IUD 1 each by Intrauterine route once.    Historical Provider, MD  NIFEdipine (PROCARDIA-XL/ADALAT-CC/NIFEDICAL-XL) 30 MG 24 hr tablet Take 30 mg by mouth daily.     Historical Provider, MD  sucralfate (CARAFATE) 1 GM/10ML suspension Take 10 mLs (1 g total) by mouth 4 (four) times daily -  with meals and at bedtime. 12/24/14   Colby Reels, PA-C   BP 118/87 mmHg  Pulse 62  Temp(Src) 98 F (36.7 C) (Oral)  Resp 16  Ht 5\' 5"  (1.651 m)  Wt 170 lb (77.111  kg)  BMI 28.29 kg/m2  SpO2 99%  LMP 12/23/2014 Physical Exam  Constitutional: She is oriented to person, place, and time. She appears well-developed and well-nourished. No distress.  HENT:  Head: Normocephalic and atraumatic.  Mouth/Throat: Oropharynx is clear and moist.  Eyes: Conjunctivae and EOM are normal.  Neck: Normal range of motion. Neck supple. No JVD present. No tracheal deviation present.  Cardiovascular: Normal rate, regular rhythm and intact distal pulses.   Radial pulse equal bilaterally  Pulmonary/Chest: Effort normal and breath sounds normal. No stridor. No respiratory distress. She has no wheezes. She has no rales. She exhibits no tenderness.  Abdominal: Soft. She exhibits no distension and no mass. There is no tenderness. There is no rebound and no guarding.  Musculoskeletal: Normal range of motion. She exhibits no edema or tenderness.  No calf asymmetry, superficial collaterals, palpable cords, edema, Homans sign negative bilaterally.    Neurological: She is alert and oriented to person, place, and time.  Skin: Skin is warm and dry. She is not diaphoretic.  Psychiatric: She has a normal mood and affect. Her behavior is normal.  Nursing note and vitals reviewed.   ED Course  Procedures (including critical care time)  DIAGNOSTIC STUDIES: Oxygen Saturation is 100% on RA, normal by my interpretation.    COORDINATION OF CARE: 8:53 PM - Discussed treatment plan with pt at bedside which includes cardiac work-up, GI cocktail, and UA. Pt verbalized understanding and agreed to plan.   Labs Review Labs Reviewed  CBC WITH DIFFERENTIAL/PLATELET - Abnormal; Notable for the following:    Hemoglobin 11.8 (*)    MCH 25.9 (*)    All other components within normal limits  COMPREHENSIVE METABOLIC PANEL - Abnormal; Notable for the following:    Creatinine, Ser 1.15 (*)    AST 13 (*)    ALT 7 (*)    GFR calc non Af Amer 60 (*)    All other components within normal limits   TROPONIN I  PREGNANCY, URINE  TROPONIN I    Imaging Review Dg Chest 2 View  12/24/2014  CLINICAL DATA:  Pt co chest pain since 5 p.m. tonight, pt denies cough/sob/diff breathing EXAM: CHEST  2 VIEW COMPARISON:  02/13/2014 FINDINGS: The heart size and mediastinal contours are within normal limits. Both lungs are clear. The visualized skeletal structures are unremarkable. IMPRESSION: No active cardiopulmonary disease. Electronically Signed   By: Nolon Nations M.D.   On: 12/24/2014 21:06   I have personally reviewed and evaluated these images and lab results as part of my medical  decision-making.   EKG Interpretation None      MDM   Final diagnoses:  Gastroesophageal reflux disease, esophagitis presence not specified  Elevated serum creatinine    Filed Vitals:   12/24/14 1957 12/24/14 2134  BP: 144/93 118/87  Pulse: 73 62  Temp: 98 F (36.7 C)   TempSrc: Oral   Resp: 18 16  Height: 5\' 5"  (1.651 m)   Weight: 170 lb (77.111 kg)   SpO2: 100% 99%    Medications  sodium chloride 0.9 % bolus 1,000 mL (1,000 mLs Intravenous New Bag/Given 12/24/14 2136)  gi cocktail (Maalox,Lidocaine,Donnatal) (30 mLs Oral Given 12/24/14 2043)  acetaminophen (TYLENOL) tablet 1,000 mg (1,000 mg Oral Given 12/24/14 2131)    Terry Bryant is 39 y.o. female presenting with lower anterior nonradiating chest pain with no associated symptoms several hours ago. Patient is low risk by heart score however she does have a family history of early cardiac death with father dying at age 73 from [place]. She had a normal stress test one year ago. EKG with no ischemic changes, troponin is negative, chest x-ray normal. Patient has an elevated creatinine at 1.15. Patient will be bolused 1 L. States that she has not been eating or drinking very much today. Reports mild improvement with GI cocktail to 5 out of 10 pain. 1000 mg of acetaminophen ordered.  Will need a delta troponin. Case signed out to Dr. Jeanell Sparrow at shift  change.   New Prescriptions   FAMOTIDINE (PEPCID) 20 MG TABLET    Take 1 tablet (20 mg total) by mouth 2 (two) times daily.   SUCRALFATE (CARAFATE) 1 GM/10ML SUSPENSION    Take 10 mLs (1 g total) by mouth 4 (four) times daily -  with meals and at bedtime.    I personally performed the services described in this documentation, which was scribed in my presence. The recorded information has been reviewed and is accurate.     Monico Blitz, PA-C 12/24/14 2149  Pattricia Boss, MD 12/24/14 [phone]

## 2014-12-24 NOTE — Discharge Instructions (Signed)
Please follow with your primary care doctor in the next 2 days for a check-up. They must obtain records for further management.   Do not hesitate to return to the Emergency Department for any new, worsening or concerning symptoms.   Take acetaminophen (Tylenol) up to 975 mg (this is normally 3 over-the-counter pills) up to 3 times a day. Do not drink alcohol. Make sure your other medications do not contain acetaminophen (Read the labels!)  Your kidney tests today are not normal. You need to drink plenty of water and have this rechecked by your primary care doctor in the next week. Do not take any NSAIDs (motrin, ibuprofen, Advil, aleve , aspirin, naproxen etc.) because they can further hurt your kidneys.    Gastroesophageal Reflux Disease, Adult Normally, food travels down the esophagus and stays in the stomach to be digested. However, when a person has gastroesophageal reflux disease (GERD), food and stomach acid move back up into the esophagus. When this happens, the esophagus becomes sore and inflamed. Over time, GERD can create small holes (ulcers) in the lining of the esophagus.  CAUSES This condition is caused by a problem with the muscle between the esophagus and the stomach (lower esophageal sphincter, or LES). Normally, the LES muscle closes after food passes through the esophagus to the stomach. When the LES is weakened or abnormal, it does not close properly, and that allows food and stomach acid to go back up into the esophagus. The LES can be weakened by certain dietary substances, medicines, and medical conditions, including:  Tobacco use.  Pregnancy.  Having a hiatal hernia.  Heavy alcohol use.  Certain foods and beverages, such as coffee, chocolate, onions, and peppermint. RISK FACTORS This condition is more likely to develop in:  People who have an increased body weight.  People who have connective tissue disorders.  People who use NSAID medicines. SYMPTOMS Symptoms  of this condition include:  Heartburn.  Difficult or painful swallowing.  The feeling of having a lump in the throat.  Abitter taste in the mouth.  Bad breath.  Having a large amount of saliva.  Having an upset or bloated stomach.  Belching.  Chest pain.  Shortness of breath or wheezing.  Ongoing (chronic) cough or a night-time cough.  Wearing away of tooth enamel.  Weight loss. Different conditions can cause chest pain. Make sure to see your health care provider if you experience chest pain. DIAGNOSIS Your health care provider will take a medical history and perform a physical exam. To determine if you have mild or severe GERD, your health care provider may also monitor how you respond to treatment. You may also have other tests, including:  An endoscopy toexamine your stomach and esophagus with a small camera.  A test thatmeasures the acidity level in your esophagus.  A test thatmeasures how much pressure is on your esophagus.  A barium swallow or modified barium swallow to show the shape, size, and functioning of your esophagus. TREATMENT The goal of treatment is to help relieve your symptoms and to prevent complications. Treatment for this condition may vary depending on how severe your symptoms are. Your health care provider may recommend:  Changes to your diet.  Medicine.  Surgery. HOME CARE INSTRUCTIONS Diet  Follow a diet as recommended by your health care provider. This may involve avoiding foods and drinks such as:  Coffee and tea (with or without caffeine).  Drinks that containalcohol.  Energy drinks and sports drinks.  Carbonated drinks or sodas.  Chocolate and cocoa.  Peppermint and mint flavorings.  Garlic and onions.  Horseradish.  Spicy and acidic foods, including peppers, chili powder, curry powder, vinegar, hot sauces, and barbecue sauce.  Citrus fruit juices and citrus fruits, such as oranges, lemons, and  limes.  Tomato-based foods, such as red sauce, chili, salsa, and pizza with red sauce.  Fried and fatty foods, such as donuts, french fries, potato chips, and high-fat dressings.  High-fat meats, such as hot dogs and fatty cuts of red and white meats, such as rib eye steak, sausage, ham, and bacon.  High-fat dairy items, such as whole milk, butter, and cream cheese.  Eat small, frequent meals instead of large meals.  Avoid drinking large amounts of liquid with your meals.  Avoid eating meals during the 2-3 hours before bedtime.  Avoid lying down right after you eat.  Do not exercise right after you eat. General Instructions  Pay attention to any changes in your symptoms.  Take over-the-counter and prescription medicines only as told by your health care provider. Do not take aspirin, ibuprofen, or other NSAIDs unless your health care provider told you to do so.  Do not use any tobacco products, including cigarettes, chewing tobacco, and e-cigarettes. If you need help quitting, ask your health care provider.  Wear loose-fitting clothing. Do not wear anything tight around your waist that causes pressure on your abdomen.  Raise (elevate) the head of your bed 6 inches (15cm).  Try to reduce your stress, such as with yoga or meditation. If you need help reducing stress, ask your health care provider.  If you are overweight, reduce your weight to an amount that is healthy for you. Ask your health care provider for guidance about a safe weight loss goal.  Keep all follow-up visits as told by your health care provider. This is important. SEEK MEDICAL CARE IF:  You have new symptoms.  You have unexplained weight loss.  You have difficulty swallowing, or it hurts to swallow.  You have wheezing or a persistent cough.  Your symptoms do not improve with treatment.  You have a hoarse voice. SEEK IMMEDIATE MEDICAL CARE IF:  You have pain in your arms, neck, jaw, teeth, or  back.  You feel sweaty, dizzy, or light-headed.  You have chest pain or shortness of breath.  You vomit and your vomit looks like blood or coffee grounds.  You faint.  Your stool is bloody or black.  You cannot swallow, drink, or eat.   This information is not intended to replace advice given to you by your health care provider. Make sure you discuss any questions you have with your health care provider.   Document Released: 11/08/2004 Document Revised: 10/20/2014 Document Reviewed: 05/26/2014 Elsevier Interactive Patient Education [employer].

## 2014-12-25 LAB — TROPONIN I: Troponin I: <0.03 ng/mL (ref <0.031)

## 2015-04-15 DIAGNOSIS — K219 Gastro-esophageal reflux disease without esophagitis: Secondary | ICD-10-CM

## 2015-04-15 DIAGNOSIS — K5900 Constipation, unspecified: Secondary | ICD-10-CM

## 2015-04-15 DIAGNOSIS — L209 Atopic dermatitis, unspecified: Secondary | ICD-10-CM | POA: Insufficient documentation

## 2015-04-15 DIAGNOSIS — K429 Umbilical hernia without obstruction or gangrene: Secondary | ICD-10-CM | POA: Insufficient documentation

## 2015-04-15 DIAGNOSIS — J309 Allergic rhinitis, unspecified: Secondary | ICD-10-CM | POA: Insufficient documentation

## 2015-04-15 DIAGNOSIS — R202 Paresthesia of skin: Secondary | ICD-10-CM

## 2015-04-15 DIAGNOSIS — R94131 Abnormal electromyogram [EMG]: Secondary | ICD-10-CM | POA: Insufficient documentation

## 2015-04-15 DIAGNOSIS — G5600 Carpal tunnel syndrome, unspecified upper limb: Secondary | ICD-10-CM

## 2015-04-15 DIAGNOSIS — M5412 Radiculopathy, cervical region: Secondary | ICD-10-CM

## 2015-04-15 DIAGNOSIS — R200 Anesthesia of skin: Secondary | ICD-10-CM | POA: Insufficient documentation

## 2015-04-15 HISTORY — DX: Paresthesia of skin: R20.0

## 2015-04-15 HISTORY — DX: Umbilical hernia without obstruction or gangrene: K42.9

## 2015-04-15 HISTORY — DX: Abnormal electromyogram (EMG): R94.131

## 2015-04-15 HISTORY — DX: Gastro-esophageal reflux disease without esophagitis: K21.9

## 2015-04-15 HISTORY — DX: Radiculopathy, cervical region: M54.12

## 2015-04-15 HISTORY — DX: Allergic rhinitis, unspecified: J30.9

## 2015-04-15 HISTORY — DX: Atopic dermatitis, unspecified: L20.9

## 2015-04-15 HISTORY — DX: Constipation, unspecified: K59.00

## 2015-04-15 HISTORY — DX: Anesthesia of skin: R20.2

## 2015-04-15 HISTORY — DX: Carpal tunnel syndrome, unspecified upper limb: G56.00

## 2015-05-21 ENCOUNTER — Encounter (HOSPITAL_BASED_OUTPATIENT_CLINIC_OR_DEPARTMENT_OTHER): Payer: Self-pay | Admitting: *Deleted

## 2015-05-21 ENCOUNTER — Other Ambulatory Visit: Payer: Self-pay

## 2015-05-21 ENCOUNTER — Emergency Department (HOSPITAL_BASED_OUTPATIENT_CLINIC_OR_DEPARTMENT_OTHER)
Admission: EM | Admit: 2015-05-21 | Discharge: 2015-05-21 | Disposition: A | Discharge status: Home / Self Care | Payer: BC Managed Care – PPO | Attending: Emergency Medicine | Admitting: Emergency Medicine

## 2015-05-21 ENCOUNTER — Emergency Department (HOSPITAL_BASED_OUTPATIENT_CLINIC_OR_DEPARTMENT_OTHER): Payer: BC Managed Care – PPO

## 2015-05-21 DIAGNOSIS — Z853 Personal history of malignant neoplasm of breast: Secondary | ICD-10-CM | POA: Insufficient documentation

## 2015-05-21 DIAGNOSIS — Z862 Personal history of diseases of the blood and blood-forming organs and certain disorders involving the immune mechanism: Secondary | ICD-10-CM | POA: Insufficient documentation

## 2015-05-21 DIAGNOSIS — Z86018 Personal history of other benign neoplasm: Secondary | ICD-10-CM | POA: Insufficient documentation

## 2015-05-21 DIAGNOSIS — R05 Cough: Secondary | ICD-10-CM | POA: Insufficient documentation

## 2015-05-21 DIAGNOSIS — Z872 Personal history of diseases of the skin and subcutaneous tissue: Secondary | ICD-10-CM | POA: Insufficient documentation

## 2015-05-21 DIAGNOSIS — Z8742 Personal history of other diseases of the female genital tract: Secondary | ICD-10-CM | POA: Diagnosis not present

## 2015-05-21 DIAGNOSIS — Z79899 Other long term (current) drug therapy: Secondary | ICD-10-CM | POA: Diagnosis not present

## 2015-05-21 DIAGNOSIS — R079 Chest pain, unspecified: Secondary | ICD-10-CM

## 2015-05-21 DIAGNOSIS — I10 Essential (primary) hypertension: Secondary | ICD-10-CM | POA: Diagnosis not present

## 2015-05-21 DIAGNOSIS — Z880 Allergy status to penicillin: Secondary | ICD-10-CM | POA: Diagnosis not present

## 2015-05-21 DIAGNOSIS — Z8619 Personal history of other infectious and parasitic diseases: Secondary | ICD-10-CM | POA: Diagnosis not present

## 2015-05-21 LAB — CBC WITH DIFFERENTIAL/PLATELET
Basophils Absolute: 0.1 10*3/uL (ref 0.0–0.1)
Basophils Relative: 1 %
Eosinophils Absolute: 0.4 10*3/uL (ref 0.0–0.7)
Eosinophils Relative: 8 %
HEMATOCRIT: 37.3 % (ref 36.0–46.0)
Hemoglobin: 11.9 g/dL — ABNORMAL LOW (ref 12.0–15.0)
LYMPHS PCT: 37 %
Lymphs Abs: 1.9 10*3/uL (ref 0.7–4.0)
MCH: 25.9 pg — ABNORMAL LOW (ref 26.0–34.0)
MCHC: 31.9 g/dL (ref 30.0–36.0)
MCV: 81.1 fL (ref 78.0–100.0)
MONO ABS: 0.5 10*3/uL (ref 0.1–1.0)
MONOS PCT: 9 %
NEUTROS ABS: 2.3 10*3/uL (ref 1.7–7.7)
Neutrophils Relative %: 45 %
Platelets: 277 10*3/uL (ref 150–400)
RBC: 4.6 MIL/uL (ref 3.87–5.11)
RDW: 12.4 % (ref 11.5–15.5)
WBC: 5.1 10*3/uL (ref 4.0–10.5)

## 2015-05-21 LAB — BASIC METABOLIC PANEL
ANION GAP: 5 (ref 5–15)
BUN: 14 mg/dL (ref 6–20)
CO2: 27 mmol/L (ref 22–32)
Calcium: 9 mg/dL (ref 8.9–10.3)
Chloride: 104 mmol/L (ref 101–111)
Creatinine, Ser: 0.86 mg/dL (ref 0.44–1.00)
GFR calc Af Amer: >60 mL/min (ref >60)
GFR calc non Af Amer: >60 mL/min (ref >60)
GLUCOSE: 94 mg/dL (ref 65–99)
POTASSIUM: 3.6 mmol/L (ref 3.5–5.1)
Sodium: 136 mmol/L (ref 135–145)

## 2015-05-21 LAB — TROPONIN I: Troponin I: <0.03 ng/mL (ref <0.031)

## 2015-05-21 NOTE — ED Notes (Signed)
Presents with chest pain, onset yesterday, just sitting at desk, began having sharp left ant chest pain, last approx 78min, then would leave just as fast, took one lose dose aspirin last PM, pain returned today. Onset this am with sharp chest pain again.

## 2015-05-21 NOTE — ED Provider Notes (Signed)
CSN: [id number]     Arrival date & time 05/21/15  1557 History   First MD Initiated Contact with Patient 05/21/15 1649     Chief Complaint  Patient presents with  . Chest Pain     Patient is a 40 y.o. female presenting with chest pain. The history is provided by the patient. No language interpreter was used.  Chest Pain Associated symptoms: cough   Associated symptoms: no abdominal pain, no back pain, no dizziness, no fever, no headache, no nausea, no palpitations, no shortness of breath and not vomiting     Terry Bryant is a 40 y.o. female who presents to the ED Complaining of intermittent chest pain since yesterday around 11 AM. Patient reports she was sitting at her desk when she began having chest pain which lasted approximately 1 minute. She reports his left sided and non-radiating. She currently rates her pain at a 4/10. She reports her pain returns about every hour. She is unable to identify alleviating or aggravating factors. Her pain is not worse with exertion. She denies any shortness of breath or palpitations. The patient does have a history of hypertension. She reports a family history with her father with a heart attack at 107. Patient denies any recent long travel. No leg pain or leg swelling. She is not a smoker. No history of diabetes or hyperlipidemia. She denies fevers, shortness of breath, palpitations, leg pain, abdominal pain, nausea, vomiting, diarrhea, lightheadedness, dizziness or syncope.  Past Medical History  Diagnosis Date  . Hypertension   . 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 Endometriosis of colon 2009  . Pelvic adhesions   . Pneumonia AGE 41  . Blood transfusion 02-16-11    '05 with ovary surgery  . Preterm labor 2004  . Infection     OCC  . Infection AGE 39    CHLAMYDIA  . Anemia     TAKES FE OCC  . Fibroids 2004  . Hypertension 2007    DR [facility]   ON MED  . Eczema   . Blood transfusion without reported diagnosis    Past Surgical History   Procedure Laterality Date  . Cesarean section    . Breast cyst excision  02/22/2011    Procedure: CYST EXCISION BREAST;  Surgeon: Haywood Lasso, MD;  Location: [facility];  Service: General;  Laterality: Left;  Removal Left Breast Mass  . Salpyngooophorectomy      LSO 2005 for TOA  . Cesarean section with bilateral tubal ligation  01/28/2012    Procedure: CESAREAN SECTION WITH BILATERAL TUBAL LIGATION;  Surgeon: Alwyn Pea, MD;  Location: [facility];  Service: Obstetrics;  Laterality: Bilateral;  Repeat C/S - POSSIBLE BTL   Family History  Problem Relation Age of Onset  . Heart disease Mother   . Diabetes Father   . Heart disease Father     DIED AT 42  . Hypertension Maternal Grandmother   . Stroke Maternal Grandmother   . Heart disease Maternal Grandfather   . Diabetes Maternal Grandfather   . Lupus Sister   . Asthma Daughter    Social History  Substance Use Topics  . Smoking status: Never Smoker   . Smokeless tobacco: Never Used  . Alcohol Use: No   OB History    Gravida Para Term Preterm AB TAB SAB Ectopic Multiple Living   2 2 2       2      Review of Systems  Constitutional: Negative for fever  and chills.  HENT: Negative for congestion and sore throat.   Eyes: Negative for visual disturbance.  Respiratory: Positive for cough. Negative for shortness of breath and wheezing.   Cardiovascular: Positive for chest pain. Negative for palpitations and leg swelling.  Gastrointestinal: Negative for nausea, vomiting, abdominal pain and diarrhea.  Genitourinary: Negative for dysuria.  Musculoskeletal: Negative for back pain and neck pain.  Skin: Negative for rash.  Neurological: Negative for dizziness, syncope, light-headedness and headaches.      Allergies  Bee venom and Penicillins  Home Medications   Prior to Admission medications   Medication Sig Start Date End Date Taking? Authorizing Provider  gabapentin (NEURONTIN) 300 MG capsule Take 300 mg by mouth 3 (three)  times daily.   Yes Historical Provider, MD  famotidine (PEPCID) 20 MG tablet Take 1 tablet (20 mg total) by mouth 2 (two) times daily. 12/24/14   Nicole Pisciotta, PA-C  levonorgestrel (MIRENA) 20 MCG/24HR IUD 1 each by Intrauterine route once.    Historical Provider, MD  NIFEdipine (PROCARDIA-XL/ADALAT-CC/NIFEDICAL-XL) 30 MG 24 hr tablet Take 30 mg by mouth daily.     Historical Provider, MD  sucralfate (CARAFATE) 1 GM/10ML suspension Take 10 mLs (1 g total) by mouth 4 (four) times daily -  with meals and at bedtime. 12/24/14   Nicole Pisciotta, PA-C   BP 120/76 mmHg  Pulse 78  Temp(Src) 98 F (36.7 C) (Oral)  Resp 16  Ht 5\' 6"  (1.676 m)  Wt 77.111 kg  BMI 27.45 kg/m2  SpO2 100% Physical Exam  Constitutional: She appears well-developed and well-nourished. No distress.  Nontoxic appearing.  HENT:  Head: Normocephalic and atraumatic.  Mouth/Throat: Oropharynx is clear and moist.  Eyes: Conjunctivae are normal. Pupils are equal, round, and reactive to light. Right eye exhibits no discharge. Left eye exhibits no discharge.  Neck: Normal range of motion. Neck supple. No JVD present. No tracheal deviation present.  Cardiovascular: Normal rate, regular rhythm, normal heart sounds and intact distal pulses.  Exam reveals no gallop and no friction rub.   No murmur heard. Bilateral radial, posterior tibialis and dorsalis pedis pulses are intact.    Pulmonary/Chest: Effort normal and breath sounds normal. No respiratory distress. She has no wheezes. She has no rales.  Lungs are clear to auscultation bilaterally. Symmetric chest expansion bilaterally. Patient has some mild left-sided chest wall tenderness to palpation that somewhat reproduces her chest pain.  Abdominal: Soft. There is no tenderness.  Musculoskeletal: She exhibits no edema or tenderness.  No lower extremity edema or tenderness.  Lymphadenopathy:    She has no cervical adenopathy.  Neurological: She is alert. Coordination normal.   Skin: Skin is warm and dry. No rash noted. She is not diaphoretic. No erythema. No pallor.  Psychiatric: She has a normal mood and affect. Her behavior is normal.  Nursing note and vitals reviewed.   ED Course  Procedures (including critical care time) Labs Review Labs Reviewed  CBC WITH DIFFERENTIAL/PLATELET - Abnormal; Notable for the following:    Hemoglobin 11.9 (*)    MCH 25.9 (*)    All other components within normal limits  BASIC METABOLIC PANEL  TROPONIN I    Imaging Review Dg Chest 2 View  05/21/2015  CLINICAL DATA:  Patient c/o Left side intermittent sharp chest pain that last under a minute that occurs every few hours associated with L arm pain. EXAM: CHEST  2 VIEW COMPARISON:  12/24/2014 FINDINGS: The heart size and mediastinal contours are within normal limits. Both  lungs are clear. No pleural effusion or pneumothorax. The visualized skeletal structures are unremarkable. IMPRESSION: No active cardiopulmonary disease. Electronically Signed   By: Lajean Manes M.D.   On: 05/21/2015 17:45   I have personally reviewed and evaluated these images and lab results as part of my medical decision-making.   EKG Interpretation   Date/Time:  Saturday May 21 2015 16:05:13 EDT Ventricular Rate:  69 PR Interval:  130 QRS Duration: 86 QT Interval:  378 QTC Calculation: 405 R Axis:   83 Text Interpretation:  Normal sinus rhythm Normal ECG No significant change  since last tracing Confirmed by FLOYD MD, DANIEL [phone]) on 05/21/2015  4:40:30 PM      Filed Vitals:   05/21/15 1608  BP: 120/76  Pulse: 78  Temp: 98 F (36.7 C)  TempSrc: Oral  Resp: 16  Height: 5\' 6"  (1.676 m)  Weight: 77.111 kg  SpO2: 100%     MDM   Meds given in ED:  Medications - No data to display  New Prescriptions   No medications on file    Final diagnoses:  Chest pain, unspecified chest pain type   This is a 40 y.o. female who presents to the ED Complaining of intermittent chest pain since  yesterday around 11 AM. Patient reports she was sitting at her desk when she began having chest pain which lasted approximately 1 minute. She reports his left sided and non-radiating. She currently rates her pain at a 4/10. She reports her pain returns about every hour. She is unable to identify alleviating or aggravating factors. Her pain is not worse with exertion. She denies any shortness of breath or palpitations.  Patient presented with chest pain to the ED. Patient is to be discharged with recommendation to follow up with PCP in regards to today's hospital visit. Chest pain is not likely of cardiac or pulmonary etiology due to presentation, Well's negative, VSS, no tracheal deviation, no JVD or new murmur, RRR, breath sounds equal bilaterally, EKG without acute abnormalities, negative troponin, and negative CXR. HEART score is 1. She's been having pain for more than 24 hours. No need for delta troponin. She is not tachypneic, tachycardic or hypoxic. I have low suspicion for PE. Patient has been advised to return to the ED if chest pain becomes exertional, associated with diaphoresis or nausea, radiates to left jaw/arm, worsens or becomes concerning in any way. Patient appears reliable for follow up and is agreeable to discharge. I advised the patient to follow-up with their primary care provider this week and request referral to cardiology for possible stress test. I advised the patient to return to the emergency department with new or worsening symptoms or new concerns. The patient verbalized understanding and agreement with plan.         Waynetta Pean, PA-C 05/21/15 [place], DO 05/21/15 2350

## 2015-05-21 NOTE — ED Notes (Signed)
Patient c/o Left side intermittent  sharpchest pain that last under a minute that occurs every few hours associated with L arm pain. No pain currently

## 2015-05-21 NOTE — Discharge Instructions (Signed)
Nonspecific Chest Pain  °Chest pain can be caused by many different conditions. There is always a chance that your pain could be related to something serious, such as a heart attack or a blood clot in your lungs. Chest pain can also be caused by conditions that are not life-threatening. If you have chest pain, it is very important to follow up with your health care provider. °CAUSES  °Chest pain can be caused by: °· Heartburn. °· Pneumonia or bronchitis. °· Anxiety or stress. °· Inflammation around your heart (pericarditis) or lung (pleuritis or pleurisy). °· A blood clot in your lung. °· A collapsed lung (pneumothorax). It can develop suddenly on its own (spontaneous pneumothorax) or from trauma to the chest. °· Shingles infection (varicella-zoster virus). °· Heart attack. °· Damage to the bones, muscles, and cartilage that make up your chest wall. This can include: °¨ Bruised bones due to injury. °¨ Strained muscles or cartilage due to frequent or repeated coughing or overwork. °¨ Fracture to one or more ribs. °¨ Sore cartilage due to inflammation (costochondritis). °RISK FACTORS  °Risk factors for chest pain may include: °· Activities that increase your risk for trauma or injury to your chest. °· Respiratory infections or conditions that cause frequent coughing. °· Medical conditions or overeating that can cause heartburn. °· Heart disease or family history of heart disease. °· Conditions or health behaviors that increase your risk of developing a blood clot. °· Having had chicken pox (varicella zoster). °SIGNS AND SYMPTOMS °Chest pain can feel like: °· Burning or tingling on the surface of your chest or deep in your chest. °· Crushing, pressure, aching, or squeezing pain. °· Dull or sharp pain that is worse when you move, cough, or take a deep breath. °· Pain that is also felt in your back, neck, shoulder, or arm, or pain that spreads to any of these areas. °Your chest pain may come and go, or it may stay  constant. °DIAGNOSIS °Lab tests or other studies may be needed to find the cause of your pain. Your health care provider may have you take a test called an ambulatory ECG (electrocardiogram). An ECG records your heartbeat patterns at the time the test is performed. You may also have other tests, such as: °· Transthoracic echocardiogram (TTE). During echocardiography, sound waves are used to create a picture of all of the heart structures and to look at how blood flows through your heart. °· Transesophageal echocardiogram (TEE). This is a more advanced imaging test that obtains images from inside your body. It allows your health care provider to see your heart in finer detail. °· Cardiac monitoring. This allows your health care provider to monitor your heart rate and rhythm in real time. °· Holter monitor. This is a portable device that records your heartbeat and can help to diagnose abnormal heartbeats. It allows your health care provider to track your heart activity for several days, if needed. °· Stress tests. These can be done through exercise or by taking medicine that makes your heart beat more quickly. °· Blood tests. °· Imaging tests. °TREATMENT  °Your treatment depends on what is causing your chest pain. Treatment may include: °· Medicines. These may include: °¨ Acid blockers for heartburn. °¨ Anti-inflammatory medicine. °¨ Pain medicine for inflammatory conditions. °¨ Antibiotic medicine, if an infection is present. °¨ Medicines to dissolve blood clots. °¨ Medicines to treat coronary artery disease. °· Supportive care for conditions that do not require medicines. This may include: °¨ Resting. °¨ Applying heat   or cold packs to injured areas. °¨ Limiting activities until pain decreases. °HOME CARE INSTRUCTIONS °· If you were prescribed an antibiotic medicine, finish it all even if you start to feel better. °· Avoid any activities that bring on chest pain. °· Do not use any tobacco products, including  cigarettes, chewing tobacco, or electronic cigarettes. If you need help quitting, ask your health care provider. °· Do not drink alcohol. °· Take medicines only as directed by your health care provider. °· Keep all follow-up visits as directed by your health care provider. This is important. This includes any further testing if your chest pain does not go away. °· If heartburn is the cause for your chest pain, you may be told to keep your head raised (elevated) while sleeping. This reduces the chance that acid will go from your stomach into your esophagus. °· Make lifestyle changes as directed by your health care provider. These may include: °¨ Getting regular exercise. Ask your health care provider to suggest some activities that are safe for you. °¨ Eating a heart-healthy diet. A registered dietitian can help you to learn healthy eating options. °¨ Maintaining a healthy weight. °¨ Managing diabetes, if necessary. °¨ Reducing stress. °SEEK MEDICAL CARE IF: °· Your chest pain does not go away after treatment. °· You have a rash with blisters on your chest. °· You have a fever. °SEEK IMMEDIATE MEDICAL CARE IF:  °· Your chest pain is worse. °· You have an increasing cough, or you cough up blood. °· You have severe abdominal pain. °· You have severe weakness. °· You faint. °· You have chills. °· You have sudden, unexplained chest discomfort. °· You have sudden, unexplained discomfort in your arms, back, neck, or jaw. °· You have shortness of breath at any time. °· You suddenly start to sweat, or your skin gets clammy. °· You feel nauseous or you vomit. °· You suddenly feel light-headed or dizzy. °· Your heart begins to beat quickly, or it feels like it is skipping beats. °These symptoms may represent a serious problem that is an emergency. Do not wait to see if the symptoms will go away. Get medical help right away. Call your local emergency services (911 in the [place] Do not drive yourself to the hospital. °  °This  information is not intended to replace advice given to you by your health care provider. Make sure you discuss any questions you have with your health care provider. °  °Document Released: 11/08/2004 Document Revised: 02/19/2014 Document Reviewed: 09/04/2013 °Elsevier Interactive Patient Education ©2016 [employer] ° °

## 2015-05-21 NOTE — ED Notes (Signed)
MD at bedside. 

## 2015-08-17 ENCOUNTER — Other Ambulatory Visit: Payer: Self-pay | Admitting: Obstetrics and Gynecology

## 2015-08-17 DIAGNOSIS — N644 Mastodynia: Secondary | ICD-10-CM

## 2015-08-19 ENCOUNTER — Other Ambulatory Visit: Payer: BC Managed Care – PPO

## 2015-08-25 ENCOUNTER — Ambulatory Visit
Admission: RE | Admit: 2015-08-25 | Discharge: 2015-08-25 | Disposition: A | Discharge status: Home / Self Care | Payer: BC Managed Care – PPO | Source: Ambulatory Visit | Attending: Obstetrics and Gynecology | Admitting: Obstetrics and Gynecology

## 2015-08-25 DIAGNOSIS — N644 Mastodynia: Secondary | ICD-10-CM

## 2015-10-09 ENCOUNTER — Encounter (HOSPITAL_BASED_OUTPATIENT_CLINIC_OR_DEPARTMENT_OTHER): Payer: Self-pay | Admitting: *Deleted

## 2015-10-09 ENCOUNTER — Emergency Department (HOSPITAL_BASED_OUTPATIENT_CLINIC_OR_DEPARTMENT_OTHER)
Admission: EM | Admit: 2015-10-09 | Discharge: 2015-10-09 | Disposition: A | Discharge status: Home / Self Care | Payer: BC Managed Care – PPO | Attending: Emergency Medicine | Admitting: Emergency Medicine

## 2015-10-09 DIAGNOSIS — Z79899 Other long term (current) drug therapy: Secondary | ICD-10-CM | POA: Insufficient documentation

## 2015-10-09 DIAGNOSIS — M79604 Pain in right leg: Secondary | ICD-10-CM

## 2015-10-09 DIAGNOSIS — I10 Essential (primary) hypertension: Secondary | ICD-10-CM | POA: Diagnosis not present

## 2015-10-09 MED ORDER — IBUPROFEN 400 MG PO TABS: 400.0000 mg | ORAL_TABLET | Freq: Three times a day (TID) | ORAL | 0 refills | Status: DC

## 2015-10-09 MED ORDER — IBUPROFEN 400 MG PO TABS
600.0000 mg | ORAL_TABLET | Freq: Once | ORAL | Status: AC
  Administered 2015-10-09: 600 mg via ORAL
  Filled 2015-10-09: qty 1

## 2015-10-09 NOTE — ED Provider Notes (Signed)
[facility] DEPT [facility] Provider Note   CSN: [id number] Arrival date & time: 10/09/15  0120     History   Chief Complaint Chief Complaint  Patient presents with  . Ankle Pain    HPI Terry Bryant is a 40 y.o. female.  Patient presents to the emergency department with complaints of right ankle pain and swelling as well as throbbing swelling.  She's had no injury or trauma.  She did ambulate in flip-flop-like shoes today.  She is not remember any explicit injury.  She felt like there is some swelling that is now improved after rest and elevation of her ankle.  She states there is still ongoing throbbing pain which is what prompted her visit to the ER.  She did not try any pain medication prior to arrival however pain is mild to moderate in severity.  No history DVT.  No pain or swelling in her right thigh.  No recent long travel or surgery.  No fevers or chills.  No numbness or weakness of the right lower extremity         Past Medical History:  Diagnosis Date  . Anemia    TAKES FE OCC  . Blood transfusion 02-16-11   '05 with ovary surgery  . Blood transfusion without reported diagnosis   . Eczema   . Endometriosis of colon 2009  . Fibroids 2004  . Hypertension   . Hypertension 2007   DR [facility]   ON MED  . Infection    OCC  . Infection AGE 39   CHLAMYDIA  . 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 Pelvic adhesions   . Pneumonia AGE 64  . Preterm labor 2004    Patient Active Problem List   Diagnosis Date Noted  . Status post repeat low transverse cesarean section 01/29/2012  . Anemia 08/06/2011  . H/O cesarean section 07/17/2011  . Endometriosis of colon 06/06/2011  . Pelvic adhesions 06/06/2011    Past Surgical History:  Procedure Laterality Date  . BREAST CYST EXCISION  02/22/2011   Procedure: CYST EXCISION BREAST;  Surgeon: Haywood Lasso, MD;  Location: [facility];  Service: General;  Laterality: Left;  Removal Left Breast Mass  . CESAREAN SECTION      . CESAREAN SECTION WITH BILATERAL TUBAL LIGATION  01/28/2012   Procedure: CESAREAN SECTION WITH BILATERAL TUBAL LIGATION;  Surgeon: Alwyn Pea, MD;  Location: [facility];  Service: Obstetrics;  Laterality: Bilateral;  Repeat C/S - POSSIBLE BTL  . salpyngooophorectomy     LSO 2005 for TOA    OB History    Gravida Para Term Preterm AB Living   2 2 2     2    SAB TAB Ectopic Multiple Live Births           2       Home Medications    Prior to Admission medications   Medication Sig Start Date End Date Taking? Authorizing Provider  famotidine (PEPCID) 20 MG tablet Take 1 tablet (20 mg total) by mouth 2 (two) times daily. 12/24/14   Nicole Pisciotta, PA-C  gabapentin (NEURONTIN) 300 MG capsule Take 300 mg by mouth 3 (three) times daily.    Historical Provider, MD  ibuprofen (ADVIL,MOTRIN) 400 MG tablet Take 1 tablet (400 mg total) by mouth 3 (three) times daily. 10/09/15   Jola Schmidt, MD  levonorgestrel (MIRENA) 20 MCG/24HR IUD 1 each by Intrauterine route once.    Historical Provider, MD  NIFEdipine (PROCARDIA-XL/ADALAT-CC/NIFEDICAL-XL) 30 MG 24 hr  tablet Take 30 mg by mouth daily.     Historical Provider, MD  sucralfate (CARAFATE) 1 GM/10ML suspension Take 10 mLs (1 g total) by mouth 4 (four) times daily -  with meals and at bedtime. 12/24/14   Elmyra Ricks Pisciotta, PA-C    Family History Family History  Problem Relation Age of Onset  . Heart disease Mother   . Diabetes Father   . Heart disease Father     DIED AT 48  . Heart disease Maternal Grandfather   . Diabetes Maternal Grandfather   . Lupus Sister   . Hypertension Maternal Grandmother   . Stroke Maternal Grandmother   . Asthma Daughter     Social History Social History  Substance Use Topics  . Smoking status: Never Smoker  . Smokeless tobacco: Never Used  . Alcohol use No     Allergies   Bee venom and Penicillins   Review of Systems Review of Systems  All other systems reviewed and are  negative.    Physical Exam Updated Vital Signs BP (!) 152/101 (BP Location: Left Arm)   Pulse 77   Temp 98.3 F (36.8 C) (Oral)   Resp 20   Ht 5\' 6"  (1.676 m)   Wt 178 lb (80.7 kg)   SpO2 99%   BMI 28.73 kg/m   Physical Exam  Constitutional: She is oriented to person, place, and time. She appears well-developed and well-nourished.  HENT:  Head: Normocephalic.  Eyes: EOM are normal.  Neck: Normal range of motion.  Pulmonary/Chest: Effort normal.  Abdominal: She exhibits no distension.  Musculoskeletal:  Normal PT and DP pulses the right foot.  No swelling of the right lower extremity as compared to the left.  No erythema or overlying skin changes noted of the right foot, right ankle, right lower leg.  No focal tenderness of the right ankle.  Full range of motion of the right hip, right ankle, right knee.  Neurological: She is alert and oriented to person, place, and time.  Psychiatric: She has a normal mood and affect.  Nursing note and vitals reviewed.    ED Treatments / Results  Labs (all labs ordered are listed, but only abnormal results are displayed) Labs Reviewed - No data to display  EKG  EKG Interpretation None       Radiology No results found.  Procedures Procedures (including critical care time)  Medications Ordered in ED Medications  ibuprofen (ADVIL,MOTRIN) tablet 600 mg (600 mg Oral Given 10/09/15 0223)     Initial Impression / Assessment and Plan / ED Course  I have reviewed the triage vital signs and the nursing notes.  Pertinent labs & imaging results that were available during my care of the patient were reviewed by me and considered in my medical decision making (see chart for details).  Clinical Course    Patient be given ibuprofen for pain.  Doubt DVT.  No signs to suggest infection.  Normal PT and DP pulse and therefore not a presentation of arterial insufficiency.  The patient's pain and swelling are improving without intervention  by me.  Seems like the majority of this is improving with elevation.  I suspect she was more active on her feet today and had throbbing discomfort in her calf.  I've asked that she return to the ER for new or worsening symptoms.  All questions answered  Final Clinical Impressions(s) / ED Diagnoses   Final diagnoses:  Right leg pain    New Prescriptions New Prescriptions  IBUPROFEN (ADVIL,MOTRIN) 400 MG TABLET    Take 1 tablet (400 mg total) by mouth 3 (three) times daily.     Jola Schmidt, MD 10/09/15 [phone]

## 2015-10-09 NOTE — ED Triage Notes (Signed)
Pt is here for right ankle pain and swelling which began about 6 hours ago and has not been associated with any injury.

## 2015-12-11 ENCOUNTER — Emergency Department (HOSPITAL_BASED_OUTPATIENT_CLINIC_OR_DEPARTMENT_OTHER): Payer: BC Managed Care – PPO

## 2015-12-11 ENCOUNTER — Emergency Department (HOSPITAL_BASED_OUTPATIENT_CLINIC_OR_DEPARTMENT_OTHER)
Admission: EM | Admit: 2015-12-11 | Discharge: 2015-12-12 | Disposition: A | Discharge status: Home / Self Care | Payer: BC Managed Care – PPO | Attending: Emergency Medicine | Admitting: Emergency Medicine

## 2015-12-11 ENCOUNTER — Encounter (HOSPITAL_BASED_OUTPATIENT_CLINIC_OR_DEPARTMENT_OTHER): Payer: Self-pay | Admitting: *Deleted

## 2015-12-11 DIAGNOSIS — I10 Essential (primary) hypertension: Secondary | ICD-10-CM | POA: Diagnosis not present

## 2015-12-11 DIAGNOSIS — R0789 Other chest pain: Secondary | ICD-10-CM | POA: Diagnosis not present

## 2015-12-11 DIAGNOSIS — Z79899 Other long term (current) drug therapy: Secondary | ICD-10-CM | POA: Insufficient documentation

## 2015-12-11 DIAGNOSIS — R079 Chest pain, unspecified: Secondary | ICD-10-CM | POA: Diagnosis present

## 2015-12-11 DIAGNOSIS — R200 Anesthesia of skin: Secondary | ICD-10-CM | POA: Insufficient documentation

## 2015-12-11 DIAGNOSIS — Z7982 Long term (current) use of aspirin: Secondary | ICD-10-CM | POA: Insufficient documentation

## 2015-12-11 DIAGNOSIS — M542 Cervicalgia: Secondary | ICD-10-CM | POA: Diagnosis not present

## 2015-12-11 DIAGNOSIS — R21 Rash and other nonspecific skin eruption: Secondary | ICD-10-CM | POA: Insufficient documentation

## 2015-12-11 LAB — CBC WITH DIFFERENTIAL/PLATELET
BASOS ABS: 0 10*3/uL (ref 0.0–0.1)
Basophils Relative: 1 %
Eosinophils Absolute: 0.2 10*3/uL (ref 0.0–0.7)
Eosinophils Relative: 5 %
HEMATOCRIT: 37.8 % (ref 36.0–46.0)
HEMOGLOBIN: 12 g/dL (ref 12.0–15.0)
LYMPHS ABS: 2 10*3/uL (ref 0.7–4.0)
LYMPHS PCT: 46 %
MCH: 25.8 pg — AB (ref 26.0–34.0)
MCHC: 31.7 g/dL (ref 30.0–36.0)
MCV: 81.3 fL (ref 78.0–100.0)
Monocytes Absolute: 0.3 10*3/uL (ref 0.1–1.0)
Monocytes Relative: 8 %
NEUTROS ABS: 1.7 10*3/uL (ref 1.7–7.7)
Neutrophils Relative %: 40 %
PLATELETS: 264 10*3/uL (ref 150–400)
RBC: 4.65 MIL/uL (ref 3.87–5.11)
RDW: 12.7 % (ref 11.5–15.5)
WBC: 4.3 10*3/uL (ref 4.0–10.5)

## 2015-12-11 LAB — BASIC METABOLIC PANEL
ANION GAP: 6 (ref 5–15)
BUN: 16 mg/dL (ref 6–20)
CHLORIDE: 101 mmol/L (ref 101–111)
CO2: 29 mmol/L (ref 22–32)
Calcium: 9 mg/dL (ref 8.9–10.3)
Creatinine, Ser: 0.79 mg/dL (ref 0.44–1.00)
GFR calc Af Amer: >60 mL/min (ref >60)
GLUCOSE: 99 mg/dL (ref 65–99)
POTASSIUM: 3.2 mmol/L — AB (ref 3.5–5.1)
Sodium: 136 mmol/L (ref 135–145)

## 2015-12-11 LAB — TROPONIN I

## 2015-12-11 NOTE — ED Notes (Addendum)
Pt states she came into ED for pain left side underneath her breast for four days, the pain wakes her up at night, pt took tylenol for the pain no relief, pt took her nerve medicine for the pain no relief.  Pt states she has a bad nerve in her neck.  Pt denies fever, nausea, vomiting and diarrhea.  Pt has chronic numbness in left arm and hand. Pt has some tingling that comes and goes.  Pt denies lightheadedness and dizziness.  Pt denies active chest pain, tightness and pressure.

## 2015-12-11 NOTE — ED Provider Notes (Signed)
[facility] DEPT [facility] Provider Note   CSN: [id number] Arrival date & time: 12/11/15  2127   By signing my name below, I, Delton Prairie, attest that this documentation has been prepared under the direction and in the presence of  Gay Filler, PA-C. Electronically Signed: Delton Prairie, ED Scribe. 12/11/15. 11:26 PM.   History   Chief Complaint Chief Complaint  Patient presents with  . Chest Pain    The history is provided by the patient. No language interpreter was used.   HPI Comments:  Terry Bryant is a 40 y.o. female, with a hx of HTN chronic neck pain and numbness to her LUE and HTN who presents to the Emergency Department complaining of intermittent, non-radiating, "5/10 sharp" L sided chest pain below the L breast  x 3-4 days. Pt states this pain wakes her up at night and usually last for about 15-20 minutes. She notes her last episode her pain lasted for about 45 minutes to 1 hour. Pt notes intermittent palpitations and BLE swelling that resolve with elevation of lower extremities. She also notes she has "a bad nerve in her neck". Pt states her pain is exacerbated with deep breaths and to the touch. She denies change in activity, vision disturbances, trouble swallowing, SOB, cough, fever, any recent trauma, abdominal pain, nausea, vomiting, hematuria, and dysuria. Pt also denies any recent travels, recent hospitalizations, surgeries, hx of cancer, hemoptysis, hx of blood clots, recreational drug use and being on blood thinners. She took ASA PTA. She has taken tylenol with minimal relief. Pt is a non-smoker. Denies h/o high cholesterol or diabetes. Family h/o cardiac conditions. Pt denies any other symptoms or complaints at this time.  Past Medical History:  Diagnosis Date  . Anemia    TAKES FE OCC  . Blood transfusion 02-16-11   '05 with ovary surgery  . Blood transfusion without reported diagnosis   . Eczema   . Endometriosis of colon 2009  . Fibroids 2004  . Hypertension     . Hypertension 2007   DR [facility]   ON MED  . Infection    OCC  . Infection AGE 89   CHLAMYDIA  . 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 Pelvic adhesions   . Pneumonia AGE 59  . Preterm labor 2004    Patient Active Problem List   Diagnosis Date Noted  . Status post repeat low transverse cesarean section 01/29/2012  . Anemia 08/06/2011  . H/O cesarean section 07/17/2011  . Endometriosis of colon 06/06/2011  . Pelvic adhesions 06/06/2011    Past Surgical History:  Procedure Laterality Date  . BREAST CYST EXCISION  02/22/2011   Procedure: CYST EXCISION BREAST;  Surgeon: Haywood Lasso, MD;  Location: [facility];  Service: General;  Laterality: Left;  Removal Left Breast Mass  . CESAREAN SECTION    . CESAREAN SECTION WITH BILATERAL TUBAL LIGATION  01/28/2012   Procedure: CESAREAN SECTION WITH BILATERAL TUBAL LIGATION;  Surgeon: Alwyn Pea, MD;  Location: [facility];  Service: Obstetrics;  Laterality: Bilateral;  Repeat C/S - POSSIBLE BTL  . salpyngooophorectomy     LSO 2005 for TOA    OB History    Gravida Para Term Preterm AB Living   2 2 2     2    SAB TAB Ectopic Multiple Live Births           2       Home Medications    Prior to Admission medications   Medication Sig  Start Date End Date Taking? Authorizing Provider  aspirin 81 MG chewable tablet Chew 81 mg by mouth daily.   Yes Historical Provider, MD  cyclobenzaprine (FLEXERIL) 5 MG tablet Take 1 tablet (5 mg total) by mouth 3 (three) times daily as needed for muscle spasms. 12/12/15   Roxanna Mew, PA-C  famotidine (PEPCID) 20 MG tablet Take 1 tablet (20 mg total) by mouth 2 (two) times daily. 12/24/14   Nicole Pisciotta, PA-C  gabapentin (NEURONTIN) 300 MG capsule Take 300 mg by mouth 3 (three) times daily.    Historical Provider, MD  levonorgestrel (MIRENA) 20 MCG/24HR IUD 1 each by Intrauterine route once.    Historical Provider, MD  naproxen (NAPROSYN) 500 MG tablet Take 1 tablet (500 mg total) by  mouth 2 (two) times daily. 12/12/15   Roxanna Mew, PA-C  NIFEdipine (PROCARDIA-XL/ADALAT-CC/NIFEDICAL-XL) 30 MG 24 hr tablet Take 30 mg by mouth daily.     Historical Provider, MD  sucralfate (CARAFATE) 1 GM/10ML suspension Take 10 mLs (1 g total) by mouth 4 (four) times daily -  with meals and at bedtime. 12/24/14   Elmyra Ricks Pisciotta, PA-C    Family History Family History  Problem Relation Age of Onset  . Heart disease Mother   . Diabetes Father   . Heart disease Father     DIED AT 95  . Heart disease Maternal Grandfather   . Diabetes Maternal Grandfather   . Lupus Sister   . Hypertension Maternal Grandmother   . Stroke Maternal Grandmother   . Asthma Daughter     Social History Social History  Substance Use Topics  . Smoking status: Never Smoker  . Smokeless tobacco: Never Used  . Alcohol use No     Allergies   Bee venom and Penicillins   Review of Systems Review of Systems  Constitutional: Negative for activity change and fever.  HENT: Negative for trouble swallowing.   Eyes: Negative for visual disturbance.  Respiratory: Negative for cough and shortness of breath.   Cardiovascular: Positive for chest pain.  Gastrointestinal: Negative for abdominal pain, diarrhea, nausea and vomiting.  Genitourinary: Negative for dysuria and hematuria.  Musculoskeletal: Positive for neck pain ( chronic, unchanged).  Skin: Positive for rash ( chronic eczema).  Neurological: Positive for numbness ( chronic LUE, unchanged). Negative for syncope.     Physical Exam Updated Vital Signs BP 129/99   Pulse 67   Temp 97.9 F (36.6 C) (Oral)   Resp 18   Ht 5\' 6"  (1.676 m)   Wt 81.6 kg   LMP 12/03/2015 (Within Days)   SpO2 99%   BMI 29.05 kg/m   Physical Exam  Constitutional: She appears well-developed and well-nourished. No distress.  HENT:  Head: Normocephalic and atraumatic.  Mouth/Throat: Oropharynx is clear and moist. No oropharyngeal exudate.  Eyes: Conjunctivae  and EOM are normal. Pupils are equal, round, and reactive to light. Right eye exhibits no discharge. Left eye exhibits no discharge. No scleral icterus.  Neck: Normal range of motion and phonation normal. Neck supple. No neck rigidity. Normal range of motion present.  Cardiovascular: Normal rate, regular rhythm, normal heart sounds and intact distal pulses.   No murmur heard. Pulmonary/Chest: Effort normal and breath sounds normal. No stridor. No respiratory distress. She has no wheezes. She has no rales. She exhibits tenderness.  L anterior chest wall tenderness beneath left breast. Lung clear to ausculation.   Abdominal: Soft. Bowel sounds are normal. She exhibits no distension. There is no tenderness. There  is no rigidity, no rebound, no guarding and no CVA tenderness.  Musculoskeletal: Normal range of motion.  Lymphadenopathy:    She has no cervical adenopathy.  Neurological: She is alert. She is not disoriented. Coordination and gait normal. GCS eye subscore is 4. GCS verbal subscore is 5. GCS motor subscore is 6.  Skin: Skin is warm and dry. She is not diaphoretic.  DP intact.   Psychiatric: She has a normal mood and affect. Her behavior is normal.  Nursing note and vitals reviewed.    ED Treatments / Results  DIAGNOSTIC STUDIES:  Oxygen Saturation is 98% on RA, normal by my interpretation.    COORDINATION OF CARE:  11:21 PM Discussed treatment plan with pt at bedside and pt agreed to plan.  Labs (all labs ordered are listed, but only abnormal results are displayed) Labs Reviewed  CBC WITH DIFFERENTIAL/PLATELET - Abnormal; Notable for the following:       Result Value   MCH 25.8 (*)    All other components within normal limits  BASIC METABOLIC PANEL - Abnormal; Notable for the following:    Potassium 3.2 (*)    All other components within normal limits  TROPONIN I  D-DIMER, QUANTITATIVE (NOT AT [facility])  TROPONIN I    EKG  EKG Interpretation  Date/Time:  Sunday December 11 2015 21:38:32 EDT Ventricular Rate:  67 PR Interval:  146 QRS Duration: 88 QT Interval:  392 QTC Calculation: 414 R Axis:   83 Text Interpretation:  Normal sinus rhythm Nonspecific T wave abnormality Abnormal ECG No significant change was found Confirmed by Florina Ou  MD, Jenny Reichmann ([id number]) on 12/12/2015 3:05:25 AM       Radiology Dg Chest 2 View  Result Date: 12/12/2015 CLINICAL DATA:  Acute onset of pain under the left breast. Cough and nasal congestion. Initial encounter. EXAM: CHEST  2 VIEW COMPARISON:  Chest radiograph from 05/21/2015 FINDINGS: The lungs are well-aerated and clear. There is no evidence of focal opacification, pleural effusion or pneumothorax. The heart is normal in size; the mediastinal contour is within normal limits. No acute osseous abnormalities are seen. IMPRESSION: No acute cardiopulmonary process seen. Electronically Signed   By: Garald Balding M.D.   On: 12/12/2015 00:20    Procedures Procedures (including critical care time)  Medications Ordered in ED Medications  aspirin chewable tablet 324 mg (324 mg Oral Not Given 12/12/15 0035)  HYDROcodone-acetaminophen (NORCO/VICODIN) 5-325 MG per tablet 1 tablet (1 tablet Oral Not Given 12/12/15 0203)  cyclobenzaprine (FLEXERIL) tablet 5 mg (5 mg Oral Given 12/12/15 0022)     Initial Impression / Assessment and Plan / ED Course  I have reviewed the triage vital signs and the nursing notes.  Pertinent labs & imaging results that were available during my care of the patient were reviewed by me and considered in my medical decision making (see chart for details).  Clinical Course  Value Comment By Time  DG Chest 2 View Normal cardiac silhouette. No evidence of consolidation, effusion, or PTX. No free air under diaphragm. Roxanna Mew, PA-C 10/30 0040   On re-evaluation, patient endorses improvement in pain. Roxanna Mew, [place] 10/30 [id number]    Patient presents to ED with complaint of intermittent,  non-radiating chest wall pain. Patient is afebrile and non-toxic appearing in NAD. VSS.  Physical exam remarkable for reproducible chest wall pain on palpation. CBC nml. BMP remarkable for mild hypokalemia - will encourage increase PO potassium from foods. Well's 0, patient has mirena. She  is not tachycardic or tachypneic. No hypoxia. No lower extremity swelling. D-dimer negative. Low suspicion for PE. CXR negative for acute cardiopulmonary process - no PNA, effusion, or PTX. Initial troponin negative. EKG shows no significant change from previous. Pain medication given in ED. Will delta trop.   On re-evaluation patient endorses improvement in pain. Delta trop negative. Heart score 3. Low suspicion of ACS. Suspect may be MSK given pain is reproducible with palpation of chest wall. Discussed results and plan with patient. Symptomatic management to include motrin and flexeril. Follow up with PCP next week. Return precautions given. Pt voiced understanding and is agreeable.   Final Clinical Impressions(s) / ED Diagnoses   Final diagnoses:  Chest pain, unspecified type    New Prescriptions New Prescriptions   CYCLOBENZAPRINE (FLEXERIL) 5 MG TABLET    Take 1 tablet (5 mg total) by mouth 3 (three) times daily as needed for muscle spasms.   NAPROXEN (NAPROSYN) 500 MG TABLET    Take 1 tablet (500 mg total) by mouth 2 (two) times daily.  I personally performed the services described in this documentation, which was scribed in my presence. The recorded information has been reviewed and is accurate.      Roxanna Mew, [place] 12/12/15 [id number]    Duffy Bruce, MD 12/12/15 1225

## 2015-12-11 NOTE — ED Triage Notes (Signed)
Pt c/o left rib pain  under leftt breast x 3 days denies SOB or n/v

## 2015-12-12 LAB — D-DIMER, QUANTITATIVE (NOT AT ARMC): D DIMER QUANT: 0.36 ug{FEU}/mL (ref 0.00–0.50)

## 2015-12-12 LAB — TROPONIN I

## 2015-12-12 MED ORDER — ASPIRIN 81 MG PO CHEW: 324.0000 mg | CHEWABLE_TABLET | Freq: Once | ORAL | Status: DC

## 2015-12-12 MED ORDER — CYCLOBENZAPRINE HCL 5 MG PO TABS
5.0000 mg | ORAL_TABLET | Freq: Once | ORAL | Status: AC
  Administered 2015-12-12: 5 mg via ORAL
  Filled 2015-12-12: qty 1

## 2015-12-12 MED ORDER — IBUPROFEN 400 MG PO TABS: 400.0000 mg | ORAL_TABLET | Freq: Once | ORAL | Status: DC

## 2015-12-12 MED ORDER — ACETAMINOPHEN 325 MG PO TABS
650.0000 mg | ORAL_TABLET | Freq: Once | ORAL | Status: DC
  Filled 2015-12-12: qty 2

## 2015-12-12 MED ORDER — HYDROCODONE-ACETAMINOPHEN 5-325 MG PO TABS
1.0000 | ORAL_TABLET | Freq: Once | ORAL | Status: DC
Start: 2015-12-12 — End: 2015-12-12

## 2015-12-12 MED ORDER — NAPROXEN 500 MG PO TABS: 500.0000 mg | ORAL_TABLET | Freq: Two times a day (BID) | ORAL | 0 refills | Status: DC

## 2015-12-12 MED ORDER — CYCLOBENZAPRINE HCL 5 MG PO TABS: 5.0000 mg | ORAL_TABLET | Freq: Three times a day (TID) | ORAL | 0 refills | Status: DC | PRN

## 2015-12-12 NOTE — Discharge Instructions (Signed)
Read the information below.  Your x-ray and EKG are re-assuring. Your labs show a mild drop in potassium. Eat potassium rich foods such as banana, squash, or sweet potatoes.  I have prescribed naprosyn and flexeril for pain relief. While taking naprosyn do not take other NSAIDs (ibuprofen, motrin, aleve). Flexeril can make you drowsy, do not drive after taking.  Use the prescribed medication as directed.  Please discuss all new medications with your pharmacist.  Follow up with your primary doctor this week for re-evaluation.    You may return to the Emergency Department at any time for worsening condition or any new symptoms that concern you. Return to ED if constant chest pain, shortness of breath, or any other new/concerning symptoms.

## 2015-12-12 NOTE — ED Notes (Signed)
Pt alert, NAD, calm, interactive, resps e/u, no dyspnea noted, family at [facility], VSS updated, pending disposition.

## 2015-12-12 NOTE — ED Notes (Signed)
Pt alert, NAD, calm, interactive, resps e/u, speaking in clear complete sentences, no dyspnea noted, (denies: pain, sob, nausea or dizziness).

## 2016-01-16 ENCOUNTER — Encounter (HOSPITAL_BASED_OUTPATIENT_CLINIC_OR_DEPARTMENT_OTHER): Payer: Self-pay | Admitting: *Deleted

## 2016-01-16 ENCOUNTER — Emergency Department (HOSPITAL_BASED_OUTPATIENT_CLINIC_OR_DEPARTMENT_OTHER)
Admission: EM | Admit: 2016-01-16 | Discharge: 2016-01-16 | Disposition: A | Discharge status: Home / Self Care | Payer: BC Managed Care – PPO | Attending: Emergency Medicine | Admitting: Emergency Medicine

## 2016-01-16 DIAGNOSIS — R300 Dysuria: Secondary | ICD-10-CM | POA: Diagnosis present

## 2016-01-16 DIAGNOSIS — Z7982 Long term (current) use of aspirin: Secondary | ICD-10-CM | POA: Diagnosis not present

## 2016-01-16 DIAGNOSIS — I10 Essential (primary) hypertension: Secondary | ICD-10-CM | POA: Diagnosis not present

## 2016-01-16 DIAGNOSIS — N390 Urinary tract infection, site not specified: Secondary | ICD-10-CM | POA: Diagnosis not present

## 2016-01-16 LAB — URINE MICROSCOPIC-ADD ON: RBC / HPF: NONE SEEN RBC/hpf (ref 0–5)

## 2016-01-16 LAB — URINALYSIS, ROUTINE W REFLEX MICROSCOPIC
BILIRUBIN URINE: NEGATIVE
Glucose, UA: NEGATIVE mg/dL
HGB URINE DIPSTICK: NEGATIVE
KETONES UR: NEGATIVE mg/dL
NITRITE: POSITIVE — AB
PROTEIN: NEGATIVE mg/dL
SPECIFIC GRAVITY, URINE: 1.017 (ref 1.005–1.030)
pH: 6 (ref 5.0–8.0)

## 2016-01-16 LAB — PREGNANCY, URINE: PREG TEST UR: NEGATIVE

## 2016-01-16 MED ORDER — SULFAMETHOXAZOLE-TRIMETHOPRIM 800-160 MG PO TABS: 1.0000 | ORAL_TABLET | Freq: Two times a day (BID) | ORAL | 0 refills | Status: AC

## 2016-01-16 NOTE — ED Provider Notes (Signed)
[facility] DEPT [facility] Provider Note   CSN: [id number] Arrival date & time: 01/16/16  1612 By signing my name below, I, Doran Stabler, attest that this documentation has been prepared under the direction and in the presence of Domenic Moras, PA-C. Electronically Signed: Doran Stabler, ED Scribe. 01/16/16. 7:44 PM.  History   Chief Complaint Chief Complaint  Patient presents with  . Urinary Tract Infection   The history is provided by the patient. No language interpreter was used.   HPI Comments: Terry Bryant is a 40 y.o. female who presents to the Emergency Department with a PMHx of UTI and HTN for an evaluation of a possible recurrent UTI with symptoms beginning 1 week ago. Pt complains of dysuria, urinary frequency, cloudy urine, and back pain. Pt denies any aggravating or alleviating factors. Pt has been taking OTC mediations for her symptoms but is unaware of the name. Pt states she has had 3 UTI this year. Pt denies any fevers, chills, CP, SOB, N/V/D hematuria, vaginal discharge, or any other symptoms at this time. Pt denies multiple sexual partners. Pt bathes instead of showering.  Past Medical History:  Diagnosis Date  . Anemia    TAKES FE OCC  . Blood transfusion 02-16-11   '05 with ovary surgery  . Blood transfusion without reported diagnosis   . Eczema   . Endometriosis of colon 2009  . Fibroids 2004  . Hypertension   . Hypertension 2007   DR [facility]   ON MED  . Infection    OCC  . Infection AGE 66   CHLAMYDIA  . 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 Pelvic adhesions   . Pneumonia AGE 64  . Preterm labor 2004    Patient Active Problem List   Diagnosis Date Noted  . Status post repeat low transverse cesarean section 01/29/2012  . Anemia 08/06/2011  . H/O cesarean section 07/17/2011  . Endometriosis of colon 06/06/2011  . Pelvic adhesions 06/06/2011    Past Surgical History:  Procedure Laterality Date  . BREAST CYST EXCISION  02/22/2011   Procedure:  CYST EXCISION BREAST;  Surgeon: Haywood Lasso, MD;  Location: [facility];  Service: General;  Laterality: Left;  Removal Left Breast Mass  . CESAREAN SECTION    . CESAREAN SECTION WITH BILATERAL TUBAL LIGATION  01/28/2012   Procedure: CESAREAN SECTION WITH BILATERAL TUBAL LIGATION;  Surgeon: Alwyn Pea, MD;  Location: [facility];  Service: Obstetrics;  Laterality: Bilateral;  Repeat C/S - POSSIBLE BTL  . salpyngooophorectomy     LSO 2005 for TOA    OB History    Gravida Para Term Preterm AB Living   2 2 2     2    SAB TAB Ectopic Multiple Live Births           2       Home Medications    Prior to Admission medications   Medication Sig Start Date End Date Taking? Authorizing Provider  aspirin 81 MG chewable tablet Chew 81 mg by mouth daily.    Historical Provider, MD  cyclobenzaprine (FLEXERIL) 5 MG tablet Take 1 tablet (5 mg total) by mouth 3 (three) times daily as needed for muscle spasms. 12/12/15   Roxanna Mew, PA-C  famotidine (PEPCID) 20 MG tablet Take 1 tablet (20 mg total) by mouth 2 (two) times daily. 12/24/14   Nicole Pisciotta, PA-C  gabapentin (NEURONTIN) 300 MG capsule Take 300 mg by mouth 3 (three) times daily.    Historical Provider,  MD  levonorgestrel (MIRENA) 20 MCG/24HR IUD 1 each by Intrauterine route once.    Historical Provider, MD  naproxen (NAPROSYN) 500 MG tablet Take 1 tablet (500 mg total) by mouth 2 (two) times daily. 12/12/15   Roxanna Mew, PA-C  NIFEdipine (PROCARDIA-XL/ADALAT-CC/NIFEDICAL-XL) 30 MG 24 hr tablet Take 30 mg by mouth daily.     Historical Provider, MD  sucralfate (CARAFATE) 1 GM/10ML suspension Take 10 mLs (1 g total) by mouth 4 (four) times daily -  with meals and at bedtime. 12/24/14   Elmyra Ricks Pisciotta, PA-C    Family History Family History  Problem Relation Age of Onset  . Heart disease Mother   . Diabetes Father   . Heart disease Father     DIED AT 40  . Heart disease Maternal Grandfather   . Diabetes Maternal  Grandfather   . Lupus Sister   . Hypertension Maternal Grandmother   . Stroke Maternal Grandmother   . Asthma Daughter     Social History Social History  Substance Use Topics  . Smoking status: Never Smoker  . Smokeless tobacco: Never Used  . Alcohol use No     Allergies   Bee venom and Penicillins   Review of Systems Review of Systems  Constitutional: Negative for chills and fever.  Respiratory: Negative for shortness of breath.   Cardiovascular: Negative for chest pain.  Gastrointestinal: Negative for diarrhea, nausea and vomiting.  Genitourinary: Positive for dysuria and frequency. Negative for vaginal discharge.  Musculoskeletal: Positive for back pain.   Physical Exam Updated Vital Signs BP (!) 154/106 (BP Location: Right Arm)   Pulse 97   Temp 98.2 F (36.8 C) (Oral)   Resp 18   Ht 5\' 6"  (1.676 m)   Wt 170 lb (77.1 kg)   LMP 10/17/2015   SpO2 98%   BMI 27.44 kg/m   Physical Exam  Constitutional: She is oriented to person, place, and time. She appears well-developed and well-nourished.  HENT:  Head: Normocephalic.  Eyes: Conjunctivae are normal.  Cardiovascular: Normal rate.   Pulmonary/Chest: Effort normal.  Tenderness to percussion of the lower back. No CVA tenderness.   Abdominal: Soft.  Mild suprapubic tenderness.  Neurological: She is alert and oriented to person, place, and time.  Skin: Skin is warm and dry.  Psychiatric: She has a normal mood and affect.  Nursing note and vitals reviewed.  ED Treatments / Results  DIAGNOSTIC STUDIES: Oxygen Saturation is 98% on room air, normal by my interpretation.    COORDINATION OF CARE: 7:44 PM Discussed treatment plan with pt at bedside and pt agreed to plan.  Labs (all labs ordered are listed, but only abnormal results are displayed) Labs Reviewed  URINALYSIS, ROUTINE W REFLEX MICROSCOPIC (NOT AT [facility]) - Abnormal; Notable for the following:       Result Value   APPearance CLOUDY (*)    Nitrite  POSITIVE (*)    Leukocytes, UA MODERATE (*)    All other components within normal limits  URINE MICROSCOPIC-ADD ON - Abnormal; Notable for the following:    Squamous Epithelial / LPF 0-5 (*)    Bacteria, UA MANY (*)    All other components within normal limits  PREGNANCY, URINE    EKG  EKG Interpretation None       Radiology No results found.  Procedures Procedures (including critical care time)  Medications Ordered in ED Medications - No data to display   Initial Impression / Assessment and Plan / ED Course  I have reviewed the triage vital signs and the nursing notes.  Pertinent labs & imaging results that were available during my care of the patient were reviewed by me and considered in my medical decision making (see chart for details).  Clinical Course   Pt has been diagnosed with a UTI. Pt is afebrile, no CVA tenderness, normotensive, and denies N/V. Pt to be dc home with antibiotics and instructions to follow up with PCP if symptoms persist.]  BP (!) 154/106 (BP Location: Right Arm)   Pulse 97   Temp 98.2 F (36.8 C) (Oral)   Resp 18   Ht 5\' 6"  (1.676 m)   Wt 77.1 kg   LMP 10/17/2015   SpO2 98%   BMI 27.44 kg/m    Final Clinical Impressions(s) / ED Diagnoses   Final diagnoses:  Lower urinary tract infectious disease    New Prescriptions New Prescriptions   SULFAMETHOXAZOLE-TRIMETHOPRIM (BACTRIM DS,SEPTRA DS) 800-160 MG TABLET    Take 1 tablet by mouth 2 (two) times daily.   I personally performed the services described in this documentation, which was scribed in my presence. The recorded information has been reviewed and is accurate.       Domenic Moras, PA-C 01/16/16 2013    Gwenyth Allegra Tegeler, MD 01/17/16 [phone]

## 2016-01-16 NOTE — ED Notes (Signed)
Patient reports that she continues to have bladder pain. The patient is "dancing" around in the room to assist with the spasm pain

## 2016-01-16 NOTE — ED Triage Notes (Signed)
She feels like she has a UTI. Dysuria, frequency, and back pain. Her urine is cloudy.

## 2016-01-19 LAB — URINE CULTURE

## 2016-06-04 ENCOUNTER — Encounter (HOSPITAL_BASED_OUTPATIENT_CLINIC_OR_DEPARTMENT_OTHER): Payer: Self-pay | Admitting: *Deleted

## 2016-06-04 ENCOUNTER — Emergency Department (HOSPITAL_BASED_OUTPATIENT_CLINIC_OR_DEPARTMENT_OTHER)
Admission: EM | Admit: 2016-06-04 | Discharge: 2016-06-04 | Disposition: A | Discharge status: Home / Self Care | Payer: BC Managed Care – PPO | Attending: Dermatology | Admitting: Dermatology

## 2016-06-04 DIAGNOSIS — L089 Local infection of the skin and subcutaneous tissue, unspecified: Secondary | ICD-10-CM | POA: Diagnosis present

## 2016-06-04 DIAGNOSIS — Z5321 Procedure and treatment not carried out due to patient leaving prior to being seen by health care provider: Secondary | ICD-10-CM | POA: Diagnosis not present

## 2016-06-04 DIAGNOSIS — I10 Essential (primary) hypertension: Secondary | ICD-10-CM | POA: Diagnosis not present

## 2016-06-04 DIAGNOSIS — Z79899 Other long term (current) drug therapy: Secondary | ICD-10-CM | POA: Insufficient documentation

## 2016-06-04 DIAGNOSIS — Z7982 Long term (current) use of aspirin: Secondary | ICD-10-CM | POA: Insufficient documentation

## 2016-06-04 NOTE — ED Triage Notes (Signed)
Redness, swelling pain and warmth to her left upper leg. She has been seen two times and given antibiotics for cellulitis. She saw her MD yesterday and blood work today.

## 2016-06-04 NOTE — ED Notes (Signed)
Patient decided that she does not want to stay to wait any longer.  Agricultural consultant notified.

## 2016-06-05 DIAGNOSIS — Z862 Personal history of diseases of the blood and blood-forming organs and certain disorders involving the immune mechanism: Secondary | ICD-10-CM | POA: Insufficient documentation

## 2016-06-05 HISTORY — DX: Personal history of diseases of the blood and blood-forming organs and certain disorders involving the immune mechanism: Z86.2

## 2017-04-11 DIAGNOSIS — N920 Excessive and frequent menstruation with regular cycle: Secondary | ICD-10-CM | POA: Insufficient documentation

## 2017-04-11 DIAGNOSIS — N809 Endometriosis, unspecified: Secondary | ICD-10-CM | POA: Insufficient documentation

## 2017-04-11 DIAGNOSIS — N921 Excessive and frequent menstruation with irregular cycle: Secondary | ICD-10-CM

## 2017-04-11 HISTORY — DX: Excessive and frequent menstruation with irregular cycle: N92.1

## 2017-04-11 HISTORY — DX: Excessive and frequent menstruation with regular cycle: N92.0

## 2017-04-11 HISTORY — DX: Endometriosis, unspecified: N80.9

## 2017-09-19 DIAGNOSIS — E876 Hypokalemia: Secondary | ICD-10-CM | POA: Insufficient documentation

## 2017-09-19 HISTORY — DX: Hypokalemia: E87.6

## 2017-11-29 IMAGING — MG 2D DIGITAL DIAGNOSTIC BILATERAL MAMMOGRAM WITH CAD AND ADJUNCT T
8 of 16 series · 8 of 40 positions shown · non-contrast
Comparison: Previous exam(s).

CLINICAL DATA: Diffuse left breast pain with no palpable lumps or
visible changes to the breast.

EXAM:
2D DIGITAL DIAGNOSTIC BILATERAL MAMMOGRAM WITH CAD AND ADJUNCT TOMO

[R CC]
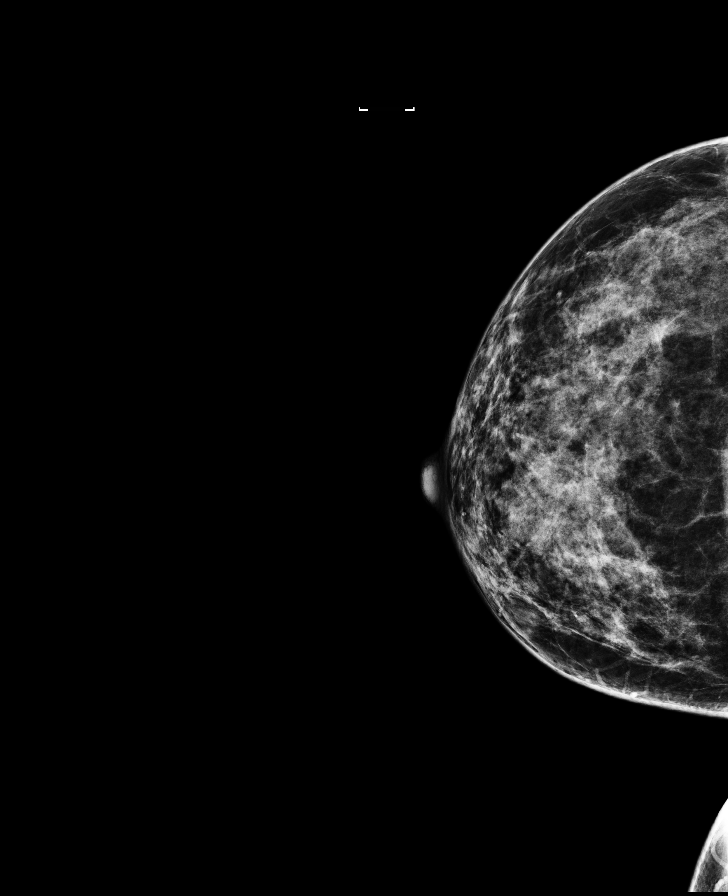

[L CC (1 of 2)]
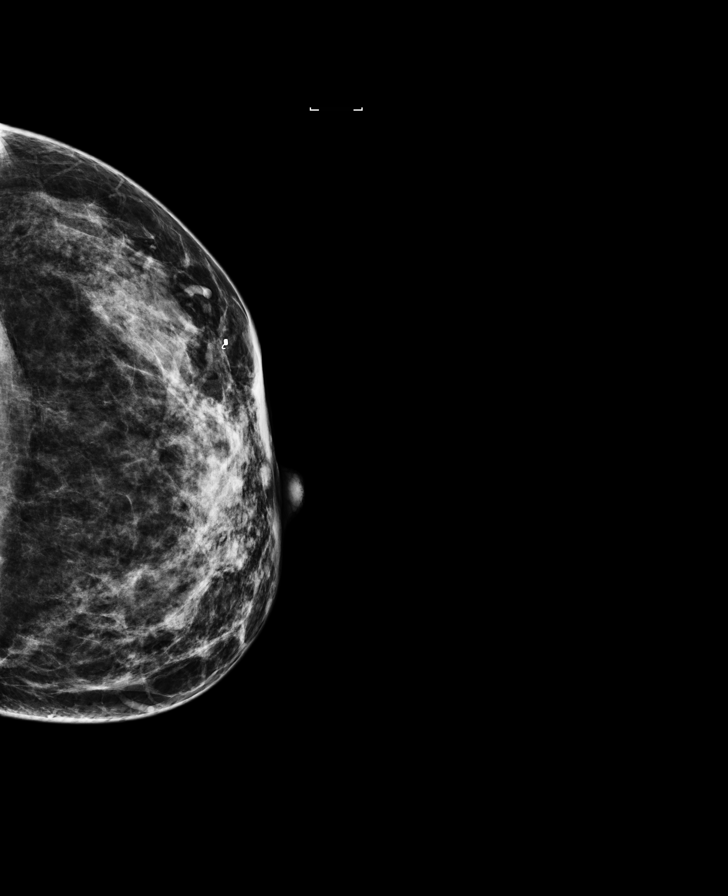

[L MLO synth-2D]
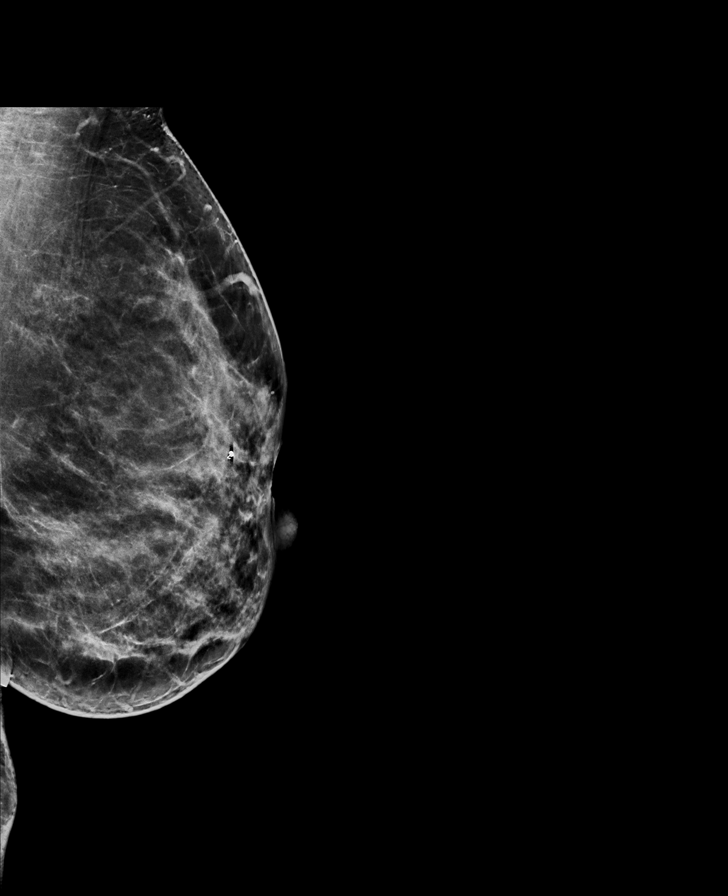

[R MLO]
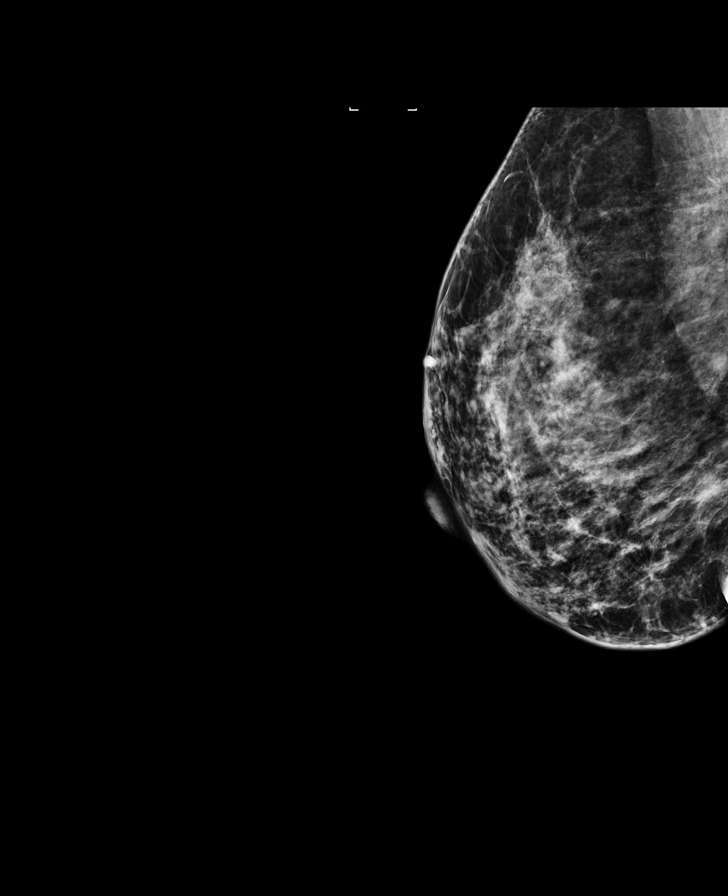

[L MLO (1 of 2)]
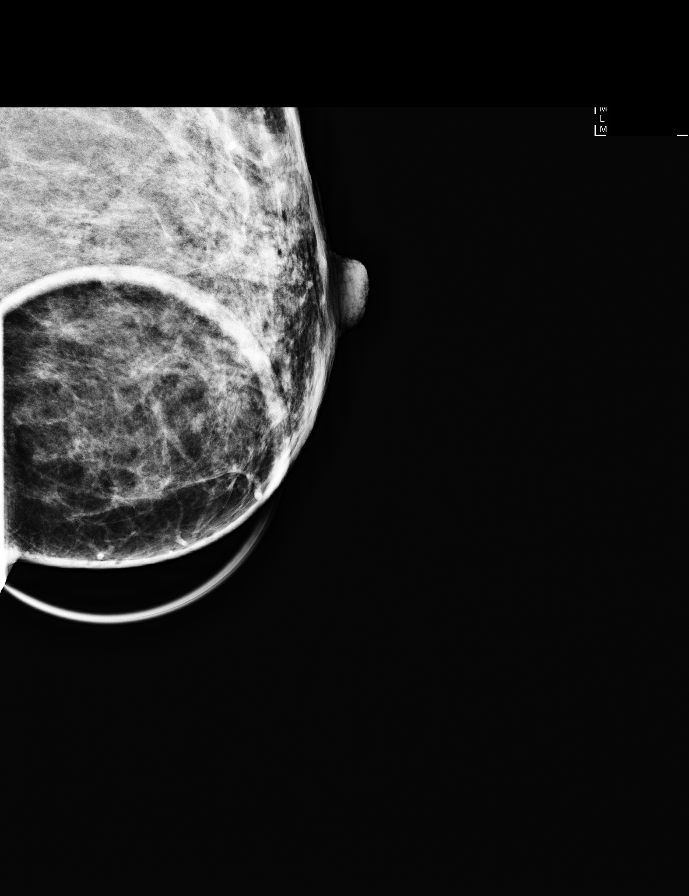

[R CC synth-2D]
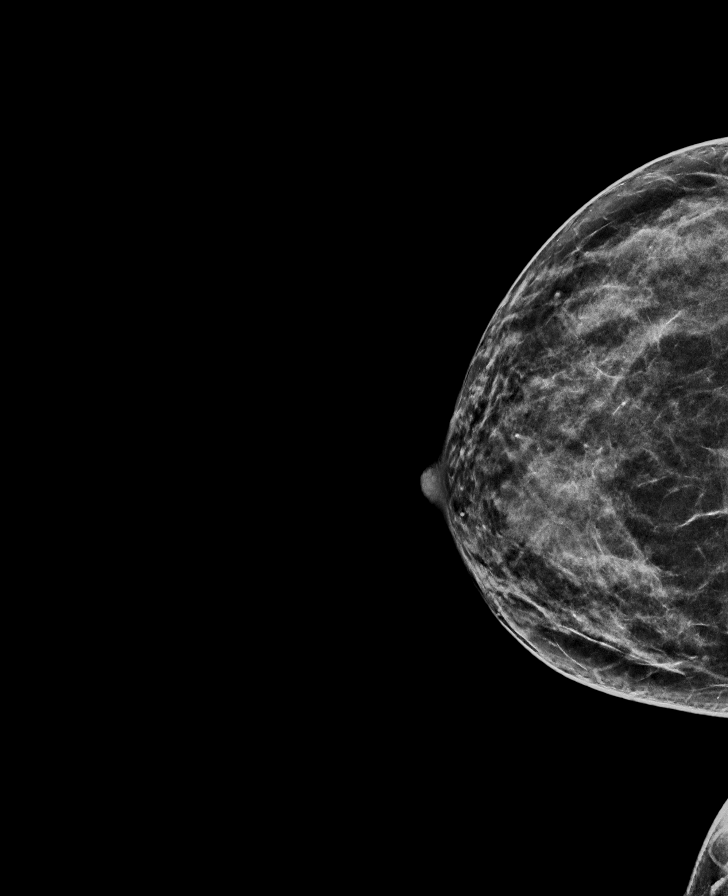

[L MLO (2 of 2)]
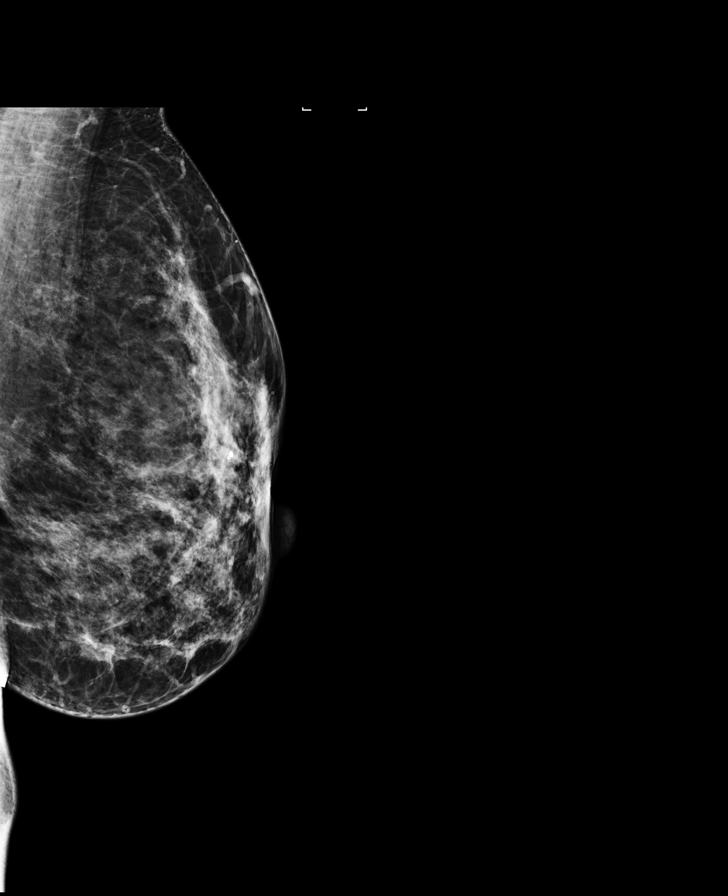

[L CC (2 of 2)]
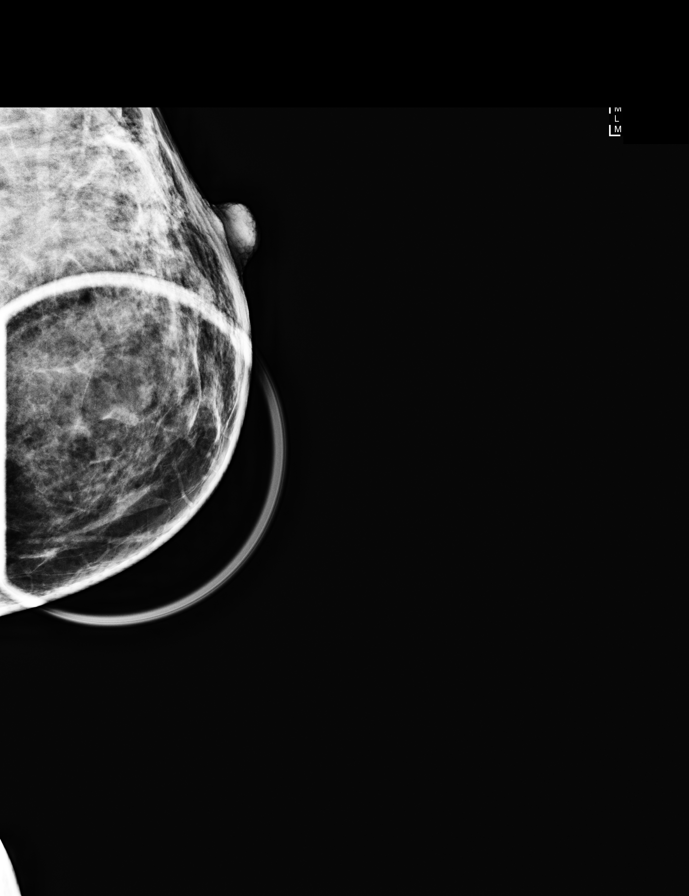

[8 of 40 positions shown; findings below may reference images not displayed]

ACR Breast Density Category c: The breast tissue is heterogeneously
dense, which may obscure small masses.
FINDINGS: No suspicious masses, calcifications, or distortion seen
bilaterally.

Mammographic images were processed with CAD.
IMPRESSION: No mammographic evidence of malignancy.

RECOMMENDATION:
Treatment of the patient's symptoms should be based on clinical and
physical exam given the lack of imaging findings.

I have discussed the findings and recommendations with the patient.
Results were also provided in writing at the conclusion of the
visit. If applicable, a reminder letter will be sent to the patient
regarding the next appointment.

BI-RADS CATEGORY  2: Benign.

## 2017-12-28 ENCOUNTER — Telehealth: Payer: Self-pay | Admitting: Obstetrics & Gynecology

## 2017-12-28 MED ORDER — NORETHINDRONE ACETATE 5 MG PO TABS: ORAL_TABLET | ORAL | 0 refills | Status: DC

## 2017-12-28 NOTE — Telephone Encounter (Addendum)
Patient is 42 yo AAF who called with complaint of heavy vaginal bleeding.  Reports she's been undergoing evaluation with Dr. Raphael Gibney due to this and have been discussing options.  She reports her cycle started about a week ago and just hasn't stopped.  Bleeding picked up today and she is passing clots with bleeding.  She has passed some clots the size of golf balls.    Denies any palpations, SOB, or light headedness.    Past medical hx significant for hypertension.  She is on nifedipine, HCTZ and potassium.  No pregnancy risk.  Recommended starting Aygestin 5mg  tid until bleeding improves.  Strict precautions given to going to the ER.  Also, recommended she have follow up with Dr. Raphael Gibney early next week. She is going to call for this appt.  Pharmacy confirmed.

## 2017-12-31 ENCOUNTER — Other Ambulatory Visit: Payer: Self-pay | Admitting: Obstetrics and Gynecology

## 2018-01-22 ENCOUNTER — Other Ambulatory Visit: Payer: Self-pay | Admitting: Obstetrics and Gynecology

## 2018-01-22 ENCOUNTER — Ambulatory Visit
Admission: RE | Admit: 2018-01-22 | Discharge: 2018-01-22 | Disposition: A | Discharge status: Home / Self Care | Payer: BC Managed Care – PPO | Source: Ambulatory Visit | Attending: Obstetrics and Gynecology | Admitting: Obstetrics and Gynecology

## 2018-01-22 DIAGNOSIS — Z30431 Encounter for routine checking of intrauterine contraceptive device: Secondary | ICD-10-CM

## 2018-06-09 ENCOUNTER — Other Ambulatory Visit: Payer: Self-pay

## 2018-06-09 ENCOUNTER — Inpatient Hospital Stay (HOSPITAL_COMMUNITY)
Admission: AD | Admit: 2018-06-09 | Discharge: 2018-06-10 | Disposition: A | Discharge status: Home / Self Care | Payer: BC Managed Care – PPO | Attending: Emergency Medicine | Admitting: Emergency Medicine

## 2018-06-09 DIAGNOSIS — N939 Abnormal uterine and vaginal bleeding, unspecified: Secondary | ICD-10-CM | POA: Insufficient documentation

## 2018-06-09 LAB — CBC WITH DIFFERENTIAL/PLATELET
Abs Immature Granulocytes: 0.02 10*3/uL (ref 0.00–0.07)
Basophils Absolute: 0.1 10*3/uL (ref 0.0–0.1)
Basophils Relative: 1 %
Eosinophils Absolute: 0.2 10*3/uL (ref 0.0–0.5)
Eosinophils Relative: 4 %
HCT: 34.4 % — ABNORMAL LOW (ref 36.0–46.0)
Hemoglobin: 10.2 g/dL — ABNORMAL LOW (ref 12.0–15.0)
Immature Granulocytes: 0 %
Lymphocytes Relative: 31 %
Lymphs Abs: 1.9 10*3/uL (ref 0.7–4.0)
MCH: 23 pg — ABNORMAL LOW (ref 26.0–34.0)
MCHC: 29.7 g/dL — ABNORMAL LOW (ref 30.0–36.0)
MCV: 77.7 fL — ABNORMAL LOW (ref 80.0–100.0)
Monocytes Absolute: 0.4 10*3/uL (ref 0.1–1.0)
Monocytes Relative: 7 %
Neutro Abs: 3.4 10*3/uL (ref 1.7–7.7)
Neutrophils Relative %: 57 %
Platelets: 304 10*3/uL (ref 150–400)
RBC: 4.43 MIL/uL (ref 3.87–5.11)
RDW: 14 % (ref 11.5–15.5)
WBC: 5.9 10*3/uL (ref 4.0–10.5)
nRBC: 0 % (ref 0.0–0.2)

## 2018-06-09 LAB — BASIC METABOLIC PANEL
Anion gap: 6 (ref 5–15)
BUN: 11 mg/dL (ref 6–20)
CO2: 29 mmol/L (ref 22–32)
Calcium: 8.9 mg/dL (ref 8.9–10.3)
Chloride: 101 mmol/L (ref 98–111)
Creatinine, Ser: 0.8 mg/dL (ref 0.44–1.00)
GFR calc Af Amer: >60 mL/min (ref >60)
GFR calc non Af Amer: >60 mL/min (ref >60)
Glucose, Bld: 95 mg/dL (ref 70–99)
Potassium: 3.5 mmol/L (ref 3.5–5.1)
Sodium: 136 mmol/L (ref 135–145)

## 2018-06-09 LAB — PROTIME-INR
INR: 1.1 (ref 0.8–1.2)
Prothrombin Time: 13.8 seconds (ref 11.4–15.2)

## 2018-06-09 LAB — POCT PREGNANCY, URINE: Preg Test, Ur: NEGATIVE

## 2018-06-09 NOTE — MAU Provider Note (Signed)
First Provider Initiated Contact with Patient 06/09/18 1917     S [name] is a 42 y.o. G53P2002 non-pregnant female who presents to MAU today with complaint of vaginal bleeding. She reports having an IUD placed with hx of fibroids, reports vaginal bleeding since 4/13. Has increased in amount and started passing large clots today. Continued vaginal bleeding for the past 14 days.   O BP (!) 155/107   Pulse 95   Temp 98.1 F (36.7 C)   Resp 16   Ht 5\' 5"  (1.651 m)   Wt 81.2 kg   SpO2 100%   BMI 29.79 kg/m  Physical Exam  Nursing note and vitals reviewed. Constitutional: She appears well-developed and well-nourished. No distress.  Cardiovascular: Normal rate, regular rhythm and normal heart sounds.  Respiratory: Effort normal and breath sounds normal. No respiratory distress. She has no wheezes. She has no rales.  GI: Soft. There is no abdominal tenderness.  Musculoskeletal: Normal range of motion.        General: No edema.  Psychiatric: She has a normal mood and affect. Her behavior is normal. Thought content normal.   A Non pregnant female Medical screening exam complete  P Discharge from MAU in stable condition  Patient given the option of transfer to [facility] for further evaluation  Report called to [facility]  Patient transferred to [facility] for evaluation  Patient may return to MAU as needed for pregnancy related complaints  Lajean Manes, CNM 06/09/2018 7:21 PM

## 2018-06-09 NOTE — ED Provider Notes (Signed)
[facility] EMERGENCY DEPARTMENT Provider Note   CSN: [id number] Arrival date & time: 06/09/18  1842    History   Chief Complaint Chief Complaint  Patient presents with  . Vaginal Bleeding    HPI Terry Bryant is a 43 y.o. female.     HPI  43 year old woman with a past medical history of uterine fibroids with heavy uterine bleeding presents to the emergency department for heavier bleeding than usual.  Patient states that for the last several months she has tried multiple IUDs and pharmacological alternatives to surgery for controlling her bleeding at home.  She has a teleconference with her OB/GYN on Thursday to discuss possible endometrial ablation.  She has occasional uterine cramping which has been constant over the last year since she started dealing with this issue.  Denies any history of sexually transmitted infections and does not believe she currently has 1.  Denies any purulence or vaginal discharge at this time.  Bowel and bladder habits have otherwise been normal.  Denies any recent fevers.  No chest pain or shortness of breath.  Takes Tylenol occasionally for the pain does not ache any other medication apart from HTN medications.  Past Medical History:  Diagnosis Date  . Anemia    TAKES FE OCC  . Blood transfusion 02-16-11   '05 with ovary surgery  . Blood transfusion without reported diagnosis   . Eczema   . Endometriosis of colon 2009  . Fibroids 2004  . Hypertension   . Hypertension 2007   DR [facility]   ON MED  . Infection    OCC  . Infection AGE 64   CHLAMYDIA  . 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 Pelvic adhesions   . Pneumonia AGE 67  . Preterm labor 2004    Patient Active Problem List   Diagnosis Date Noted  . Status post repeat low transverse cesarean section 01/29/2012  . Anemia 08/06/2011  . H/O cesarean section 07/17/2011  . Endometriosis of colon 06/06/2011  . Pelvic adhesions 06/06/2011    Past Surgical History:   Procedure Laterality Date  . BREAST CYST EXCISION  02/22/2011   Procedure: CYST EXCISION BREAST;  Surgeon: Haywood Lasso, MD;  Location: [facility] ORS;  Service: General;  Laterality: Left;  Removal Left Breast Mass  . CESAREAN SECTION    . CESAREAN SECTION WITH BILATERAL TUBAL LIGATION  01/28/2012   Procedure: CESAREAN SECTION WITH BILATERAL TUBAL LIGATION;  Surgeon: Alwyn Pea, MD;  Location: [facility];  Service: Obstetrics;  Laterality: Bilateral;  Repeat C/S - POSSIBLE BTL  . salpyngooophorectomy     LSO 2005 for TOA     OB History    Gravida  2   Para  2   Term  2   Preterm      AB      Living  2     SAB      TAB      Ectopic      Multiple      Live Births  2            Home Medications    Prior to Admission medications   Medication Sig Start Date End Date Taking? Authorizing Provider  aspirin 81 MG chewable tablet Chew 81 mg by mouth daily.    [provider]  cyclobenzaprine (FLEXERIL) 5 MG tablet Take 1 tablet (5 mg total) by mouth 3 (three) times daily as needed for muscle spasms. 12/12/15   Bjorn Loser,  Eddie North, PA-C  famotidine (PEPCID) 20 MG tablet Take 1 tablet (20 mg total) by mouth 2 (two) times daily. 12/24/14   Pisciotta, Elmyra Ricks, PA-C  gabapentin (NEURONTIN) 300 MG capsule Take 300 mg by mouth 3 (three) times daily.    [provider]  levonorgestrel (MIRENA) 20 MCG/24HR IUD 1 each by Intrauterine route once.    [provider]  naproxen (NAPROSYN) 500 MG tablet Take 1 tablet (500 mg total) by mouth 2 (two) times daily. 12/12/15   Frederica Kuster, PA-C  NIFEdipine (PROCARDIA-XL/ADALAT-CC/NIFEDICAL-XL) 30 MG 24 hr tablet Take 30 mg by mouth daily.     [provider]  norethindrone (AYGESTIN) 5 MG tablet 1 tab TID until bleeding improves, then can decrease to BID 12/28/17   Megan Salon, MD  sucralfate (CARAFATE) 1 GM/10ML suspension Take 10 mLs (1 g total) by mouth 4 (four) times daily -  with meals and at bedtime.  12/24/14   Pisciotta, Elmyra Ricks, PA-C    Family History Family History  Problem Relation Age of Onset  . Heart disease Mother   . Diabetes Father   . Heart disease Father        DIED AT 52  . Heart disease Maternal Grandfather   . Diabetes Maternal Grandfather   . Lupus Sister   . Hypertension Maternal Grandmother   . Stroke Maternal Grandmother   . Asthma Daughter     Social History Social History   Tobacco Use  . Smoking status: Never Smoker  . Smokeless tobacco: Never Used  Substance Use Topics  . Alcohol use: No  . Drug use: No     Allergies   Bee venom and Penicillins   Review of Systems Review of Systems  Constitutional: Negative for chills and fever.  HENT: Negative for ear pain and sore throat.   Eyes: Negative for pain and visual disturbance.  Respiratory: Negative for cough and shortness of breath.   Cardiovascular: Negative for chest pain and palpitations.  Gastrointestinal: Negative for abdominal pain and vomiting.  Genitourinary: Positive for menstrual problem and vaginal bleeding. Negative for dysuria, flank pain, hematuria and urgency.  Musculoskeletal: Negative for arthralgias and back pain.  Skin: Negative for color change and rash.  Allergic/Immunologic: Negative for immunocompromised state.  Neurological: Negative for seizures and syncope.  Hematological: Does not bruise/bleed easily.  All other systems reviewed and are negative.    Physical Exam Updated Vital Signs BP (!) 136/91 (BP Location: Left Arm)   Pulse 83   Temp 97.8 F (36.6 C) (Oral)   Resp 18   Ht 5\' 5"  (1.651 m)   Wt 81.2 kg   SpO2 98%   BMI 29.79 kg/m   Physical Exam Vitals signs and nursing note reviewed.  Constitutional:      General: She is not in acute distress.    Appearance: She is well-developed.  HENT:     Head: Normocephalic and atraumatic.     Nose: No congestion or rhinorrhea.     Mouth/Throat:     Pharynx: No oropharyngeal exudate or posterior  oropharyngeal erythema.  Eyes:     Conjunctiva/sclera: Conjunctivae normal.  Neck:     Musculoskeletal: Neck supple.  Cardiovascular:     Rate and Rhythm: Normal rate and regular rhythm.     Heart sounds: No murmur.  Pulmonary:     Effort: Pulmonary effort is normal. No respiratory distress.     Breath sounds: Normal breath sounds.  Abdominal:     Palpations: Abdomen  is soft.     Tenderness: There is no abdominal tenderness.  Genitourinary:    General: Normal vulva.     Vagina: No vaginal discharge.     Comments: Multiple clots coming from cervical Os. Unable to appreciate strings from IUD Musculoskeletal:        General: No deformity or signs of injury.     Right lower leg: No edema.     Left lower leg: No edema.  Skin:    General: Skin is warm and dry.  Neurological:     General: No focal deficit present.     Mental Status: She is alert and oriented to person, place, and time. Mental status is at baseline.     Sensory: No sensory deficit.     Motor: No weakness.     Gait: Gait normal.      ED Treatments / Results  Labs (all labs ordered are listed, but only abnormal results are displayed) Labs Reviewed  POCT PREGNANCY, URINE    EKG None  Radiology No results found.  Procedures Procedures (including critical care time)  Medications Ordered in ED Medications - No data to display   Initial Impression / Assessment and Plan / ED Course  I have reviewed the triage vital signs and the nursing notes.  Pertinent labs & imaging results that were available during my care of the patient were reviewed by me and considered in my medical decision making (see chart for details).         44 year old woman presents hemodynamically stable for evaluation of vaginal bleeding.  This is been an ongoing problem for several months already in correspondence with her OB/GYN.  Vaginal exam does show blood clots but patient overall is hemodynamically stable and does not appear to  be anemic at this time.  Patient does not need a blood transfusion.  Low suspicion for sexually transmitted infection and pelvic pain is likely secondary to her chronic fibroids and uterine bleeding as it is similar quality to previous.  At this time the patient is stable for discharge and follow-up with her OB/GYN and she will try to schedule her teleconference sooner than Thursday. Final Clinical Impressions(s) / ED Diagnoses   Final diagnoses:  Vaginal bleeding    ED Discharge Orders    None       Andee Poles, MD 06/13/18 [id number]    Carmin Muskrat, MD 06/15/18 [phone]

## 2018-06-09 NOTE — MAU Note (Signed)
Pt presents to MAU with complaints of heavy vaginal bleeding passing large clots for two weeks. States that Dr Mancel Bale instructed her to come in

## 2018-06-09 NOTE — ED Notes (Addendum)
Pt reports she has been bleeding since the 13th of April.  She has been working w/ her OB w/ birth control options and such.  She is currently going through 5-6 pads a day.  She checked to make sure her IUD was still in.  This evening she reports she started passing large clots (one the size of her fist).  Reports she was soaking a pad every 20 minutes.  OB told her to come to the MAU.  MAU sent her here.

## 2018-06-09 NOTE — MAU Note (Signed)
Report given to [facility] ED nurse.

## 2018-07-09 NOTE — H&P (Signed)
Reason for admission:   Menorrhagia   History:     Terry Bryant is a 43 y.o. female, G2P2002, presenting today to undergo hysteroscopic endometrial ablation for menorrhagia.  Patient is  known for [facility], uterine fibroids with BMI 30 s/p LSO 2005 for post cesarean TOA  Mirena inserted 2014 was removed July 2019 because cycle had started becoming heavier. Mirena was replaced but expelled November 2019.  Trial of Lysteda in December: normal cycle. Did not use again. No side effects.  Cycle has remained monthly, 2-3 days, light pantyliner. Now flow is 6 days, 3 heavy days with 1 pad every 2 hours, clots as large as golf ball, mild dysmenorrhea. No BTB  01/08/18: uterine volume 310 cc with multiple fibroids, 5 measurable fibroids 2-3.3 cm, EM 6.9 mm, normal right ovary. No submucosal fibroid. 12/19: Hgb 10.2 Patient was seen 03/12/18 with POC of BCP since patient needs contraception: Loestrin 1/20 given continuously with succesful amenorrhea but had to stop for increased BP  Cycle started 04/25/18 (1st cycle post Mirena) initially light flow for 4-5 days then the flood gates opened with clots ad 5 cm. Was seen at [facility] with Hgb 10. Started Norethindrone 10 mg for 7 days and now on 5 mg daily. Bleeding is spotting right now.  TVUS: IUD has been expelled. Uterine volume 420 cc with 5 measurable fibroids largest at 4.8 cm. Patient desires to proceed with HTA ablation.   Review of system:  Non-contributory   Past Medical History:   Past Medical History:  Diagnosis Date  . Anemia    TAKES FE OCC  . Blood transfusion 02-16-11   '05 with ovary surgery  . Blood transfusion without reported diagnosis   . Eczema   . Endometriosis of colon 2009  . Fibroids 2004  . Hypertension   . Hypertension 2007   DR [facility]   ON MED  . Infection    OCC  . Infection AGE 8   CHLAMYDIA  . 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 Pelvic adhesions   . Pneumonia AGE 35  . Preterm labor 2004     Allergies:   Allergies  Allergen Reactions  . Bee Venom Hives and Swelling  . Penicillins Hives and Swelling    Documented treatment with amoxicillin in 2008 without difficulty    Social History:   Social History   Socioeconomic History  . Marital status: Married    Spouse name: CHRISTOPHER  . Number of children: 2  . Years of education: 80  . Highest education level: Not on file  Occupational History  . Occupation: Product manager: [employer]  Social Needs  . Financial resource strain: Not on file  . Food insecurity:    Worry: Not on file    Inability: Not on file  . Transportation needs:    Medical: Not on file    Non-medical: Not on file  Tobacco Use  . Smoking status: Never Smoker  . Smokeless tobacco: Never Used  Substance and Sexual Activity  . Alcohol use: No  . Drug use: No  . Sexual activity: Not on file  Lifestyle  . Physical activity:    Days per week: Not on file    Minutes per session: Not on file  . Stress: Not on file  Relationships  . Social connections:    Talks on phone: Not on file    Gets together: Not on file    Attends religious service: Not on  file    Active member of club or organization: Not on file    Attends meetings of clubs or organizations: Not on file    Relationship status: Not on file  Other Topics Concern  . Not on file  Social History Narrative  . Not on file    Family History:    Family History  Problem Relation Age of Onset  . Heart disease Mother   . Diabetes Father   . Heart disease Father        DIED AT 88  . Heart disease Maternal Grandfather   . Diabetes Maternal Grandfather   . Lupus Sister   . Hypertension Maternal Grandmother   . Stroke Maternal Grandmother   . Asthma Daughter     Physical exam:    General Appearance: Alert, appropriate appearance for age. No acute distress Neck / Thyroid: Supple, no masses, nodes or enlargement Lungs: clear to auscultation  bilaterally Back: No CVA tenderness. Cardiovascular: Regular rate and rhythm. S1, S2, no murmur Gastrointestinal: Soft, non-tender, no masses or organomegaly Pelvic Exam: normal external genitalia and vagina. Enlarged and irregular but non-tender uterus, approx. 10 weeks size. Normal ovaries   Assessment:   1.Persistent menorrhagia with uterine fibroids 2. Patient desires to avoid hysterectomy due to pelvic adhesions from previous TOA surgery   Plan:    Hydrothermal Ablation of Endometrium  Procedure reviewed . Risks of bleeding, infection and injury to uterus +/- intra-abdominal organs discussed. Benefits include: reduction in menstrual flow with possible amenorrhea. Patient is aware of increased risk of failure due to numerous fibroids. Pre-op instructions: yes Questions answered

## 2018-07-10 ENCOUNTER — Other Ambulatory Visit: Payer: Self-pay | Admitting: Obstetrics and Gynecology

## 2018-07-10 ENCOUNTER — Other Ambulatory Visit: Payer: Self-pay

## 2018-07-10 ENCOUNTER — Encounter (HOSPITAL_BASED_OUTPATIENT_CLINIC_OR_DEPARTMENT_OTHER): Payer: Self-pay | Admitting: *Deleted

## 2018-07-14 ENCOUNTER — Other Ambulatory Visit (HOSPITAL_COMMUNITY)
Admission: RE | Admit: 2018-07-14 | Discharge: 2018-07-14 | Disposition: A | Discharge status: Home / Self Care | Payer: BC Managed Care – PPO | Source: Ambulatory Visit | Attending: Obstetrics and Gynecology | Admitting: Obstetrics and Gynecology

## 2018-07-14 ENCOUNTER — Other Ambulatory Visit: Payer: Self-pay

## 2018-07-14 ENCOUNTER — Encounter (HOSPITAL_BASED_OUTPATIENT_CLINIC_OR_DEPARTMENT_OTHER)
Admission: RE | Admit: 2018-07-14 | Discharge: 2018-07-14 | Disposition: A | Discharge status: Home / Self Care | Payer: BC Managed Care – PPO | Source: Ambulatory Visit

## 2018-07-14 DIAGNOSIS — Z1159 Encounter for screening for other viral diseases: Secondary | ICD-10-CM | POA: Insufficient documentation

## 2018-07-14 DIAGNOSIS — Z01818 Encounter for other preprocedural examination: Secondary | ICD-10-CM | POA: Insufficient documentation

## 2018-07-14 LAB — BASIC METABOLIC PANEL
Anion gap: 9 (ref 5–15)
BUN: 10 mg/dL (ref 6–20)
CO2: 26 mmol/L (ref 22–32)
Calcium: 9 mg/dL (ref 8.9–10.3)
Chloride: 102 mmol/L (ref 98–111)
Creatinine, Ser: 1.05 mg/dL — ABNORMAL HIGH (ref 0.44–1.00)
GFR calc Af Amer: >60 mL/min (ref >60)
GFR calc non Af Amer: >60 mL/min (ref >60)
Glucose, Bld: 85 mg/dL (ref 70–99)
Potassium: 3.7 mmol/L (ref 3.5–5.1)
Sodium: 137 mmol/L (ref 135–145)

## 2018-07-14 LAB — CBC
HCT: 32.4 % — ABNORMAL LOW (ref 36.0–46.0)
Hemoglobin: 9.6 g/dL — ABNORMAL LOW (ref 12.0–15.0)
MCH: 23.6 pg — ABNORMAL LOW (ref 26.0–34.0)
MCHC: 29.6 g/dL — ABNORMAL LOW (ref 30.0–36.0)
MCV: 79.6 fL — ABNORMAL LOW (ref 80.0–100.0)
Platelets: 355 10*3/uL (ref 150–400)
RBC: 4.07 MIL/uL (ref 3.87–5.11)
RDW: 14.3 % (ref 11.5–15.5)
WBC: 6.5 10*3/uL (ref 4.0–10.5)
nRBC: 0 % (ref 0.0–0.2)

## 2018-07-14 LAB — POCT PREGNANCY, URINE: Preg Test, Ur: NEGATIVE

## 2018-07-14 NOTE — Progress Notes (Addendum)
Pt here for PAT. Blood was drawn for BMP and CBC. POCT urine pregnancy negative. EKG showed  Nonspecific T wave abnormality. I took her EKG to Dr.Witman for review and also informed her that pt states that she has had chest pain on and off for the last week, also states that she often wakes up during the night and her "heart is racing", pt denies being SOB during these episodes although does state when this occurs that she wakes up "breathing heavy". Per Dr. Christella Hartigan, this pt should be  cleared by her PCP before her surgery on Thursday 07/17/18. Will call the pts PCP.   During PAT visit, pt denied any current chest pain or SOB. I advised the pt to call her PCP after leaving our facility to also inform of them  this situation.

## 2018-07-14 NOTE — Progress Notes (Addendum)
I spoke with Terry Bryant from [place], where the pts PCP Terry Pert, NP practices. I informed Terry Bryant about Dr. Kerin Salen concerns about the pt and she stated that she had actually spoke with this pt over the phone earlier today and the pt made a f/u appt with her PCP for tomorrow morning at 0800  07/15/18. I have also faxed a copy of the pts EKG from today to their fax [phone].  I asked Terry Bryant if she would have her PCP fax clearance for surgery to [phone]. Will follow up wit PAT nurse tomorrow.

## 2018-07-15 ENCOUNTER — Other Ambulatory Visit: Payer: Self-pay

## 2018-07-15 ENCOUNTER — Ambulatory Visit (INDEPENDENT_AMBULATORY_CARE_PROVIDER_SITE_OTHER): Payer: BC Managed Care – PPO | Admitting: Cardiology

## 2018-07-15 ENCOUNTER — Encounter: Payer: Self-pay | Admitting: Cardiology

## 2018-07-15 VITALS — BP 124/86 | HR 94 | Temp 95.4°F | Ht 65.0 in | Wt 182.1 lb

## 2018-07-15 DIAGNOSIS — R9431 Abnormal electrocardiogram [ECG] [EKG]: Secondary | ICD-10-CM | POA: Diagnosis not present

## 2018-07-15 DIAGNOSIS — R002 Palpitations: Secondary | ICD-10-CM

## 2018-07-15 DIAGNOSIS — Z01810 Encounter for preprocedural cardiovascular examination: Secondary | ICD-10-CM | POA: Insufficient documentation

## 2018-07-15 DIAGNOSIS — D500 Iron deficiency anemia secondary to blood loss (chronic): Secondary | ICD-10-CM

## 2018-07-15 DIAGNOSIS — I10 Essential (primary) hypertension: Secondary | ICD-10-CM | POA: Diagnosis not present

## 2018-07-15 HISTORY — DX: Encounter for preprocedural cardiovascular examination: Z01.810

## 2018-07-15 HISTORY — DX: Palpitations: R00.2

## 2018-07-15 NOTE — Patient Instructions (Addendum)
1. Avoid all over-the-counter antihistamines except Claritin/Loratadine and Zyrtec/Cetrizine. 2. Avoid all combination including cold sinus allergies flu decongestant and sleep medications 3. You can use Robitussin DM Mucinex and Mucinex DM for cough. 4. can use Tylenol aspirin ibuprofen and naproxen but no combinations such as sleep or sinus.[email or web address] [email or web address]        FDA-cleared, clinical grade mobile EKG monitor: Terry Bryant is the most clinically-validated mobile EKG used by the world's leading cardiac care medical professionals With Basic service, know instantly if your heart rhythm is normal or if atrial fibrillation is detected, and email the last single EKG recording to yourself or your doctor Premium service, available for purchase through the [employer] app for $9.99 per month or $99 per year, includes unlimited history and storage of your EKG recordings, a monthly EKG summary report to share with your doctor, along with the ability to track your blood pressure, activity and weight Includes one [employer]iaMobile phone clip FREE SHIPPING: Standard delivery 1-3 business days. Orders placed by 11:00am PST will ship that afternoon. Otherwise, will ship next business day. All orders ship via [employer] from [place], [place]       Medication Instructions:  Your physician recommends that you continue on your current medications as directed. Please refer to the Current Medication list given to you today.  If you need a refill on your cardiac medications before your next appointment, please call your pharmacy.   Lab work: None If you have labs (blood work) drawn today and your tests are completely normal, you will receive your results only by: Marland Kitchen MyChart Message (if you have MyChart) OR . A paper copy in the mail If you have any lab test that is abnormal or we need to change your treatment, we will call you to review the results.  Testing/Procedures: Your  physician has recommended that you wear a ZIO monitor. ZIO monitors are medical devices that record the heart's electrical activity. Doctors most often use these monitors to diagnose arrhythmias. Arrhythmias are problems with the speed or rhythm of the heartbeat. The monitor is a small, portable device. You can wear one while you do your normal daily activities. This is usually used to diagnose what is causing palpitations/syncope (passing out).  WEAR 14 DAYS   Follow-Up: At [facility], you and your health needs are our priority.  As part of our continuing mission to provide you with exceptional heart care, we have created designated Provider Care Teams.  These Care Teams include your primary Cardiologist (physician) and Advanced Practice Providers (APPs -  Physician Assistants and Nurse Practitioners) who all work together to provide you with the care you need, when you need it. You will need a follow up appointment in 6 weeks.   Any Other Special Instructions Will Be Listed Below (If Applicable).

## 2018-07-15 NOTE — Progress Notes (Signed)
Cardiology Office Note:    Date:  07/15/2018   ID:  Terry Bryant, DOB [birth year], MRN [id number]  PCP:  Jefm Petty, MD  Cardiologist:  Shirlee More, MD    Referring MD: Jefm Petty, MD    ASSESSMENT:    1. Preoperative cardiovascular examination   2. Essential hypertension   3. Abnormal electrocardiogram (ECG) (EKG)   4. Iron deficiency anemia due to chronic blood loss    PLAN:    In order of problems listed above:  1. Preoperative evaluation, she has no known heart disease hypertension is controlled and will undergo an outpatient evaluation for palpitation after surgery.  My perspective she is optimized hypertension controlled and is appropriate for planned outpatient procedure.  Her EKG today shows the same minor nonspecific T wave changes near normal 2. Stable blood pressure target continue treatment diuretic calcium channel blocker 3. EKG is near normal nonspecific T waves 4. Continue iron 5. Palpitation suggestive of SVT she will utilize ambulatory ZIO monitor for 2 weeks and follow-up with me in the office afterwards   Next appointment: 6 weeks   Medication Adjustments/Labs and Tests Ordered: Current medicines are reviewed at length with the patient today.  Concerns regarding medicines are outlined above.  No orders of the defined types were placed in this encounter.  No orders of the defined types were placed in this encounter.   No chief complaint on file.   History of Present Illness:    Terry Bryant is a 43 y.o. female with a hx menorrhagia referred for preoperative evaluation because of EKG findings yesterday.  She is scheduled for hysteroscopic endometrial ablation for menorrhagia Dr Cletis Media and referred with an abnormal EKG. I reviewed that EKG showed sinus rhythm and very minor nonspecific T wave abnormality in the inferior leads in my judgment is a near normal EKG.  The T waves are nonischemic and there is no evidence of preceding myocardial  infarction.  She was seen in the emergency room 12/11/2015 with nonexertional sharp chest pain her EKG also showed nonspecific T wave abnormality actually more abnormal than this 1 performed yesterday her troponin was normal and she was discharged from the emergency room.  On review of the chart she has not had cardiac diagnostic testing performed.  Seen on one other occasion 2017 with chest pain discharge from the emergency room and 3 times in 2016.  She had a chest CTA pulmonary embolism protocol in 2014 no evidence of pulmonary embolism or aortopathy and she had no calcification of the coronary vessels. Compliance with diet, lifestyle and medications: yes  She is scheduled for endometrial ablation on Thursday.  Surgery as outpatient elective.  I asked her about all these emergency room visits and then 1 for chest pain it is for intermittent racing of her heart but tends to wake her out of her sleep at nighttime last 1 to 2 minutes and she has never had an evaluation for tachycardia.  We will get a plan next week to schedule an outpatient ambulatory heart rhythm monitor.  There is no chest pain shortness of breath or syncope.  Her exercise tolerance exceeds 7 METS.  In my opinion as a cardiology she is optimized for planned low risk procedure blood pressure is controlled she stopped taking aspirin and I do not think we need to delay surgery for evaluation for SVT.  I will see back in the office in 6 months after the monitor Past Medical History:  Diagnosis Date  .  Anemia    TAKES FE OCC  . Blood transfusion 02-16-11   '05 with ovary surgery  . Blood transfusion without reported diagnosis   . Endometriosis of colon 2009  . Fibroids 2004  . Hypertension   . Hypertension 2007   DR [facility]   ON MED  . 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 Pneumonia AGE 2    Past Surgical History:  Procedure Laterality Date  . BREAST CYST EXCISION  02/22/2011   Procedure: CYST EXCISION BREAST;  Surgeon:  Haywood Lasso, MD;  Location: [facility];  Service: General;  Laterality: Left;  Removal Left Breast Mass  . CESAREAN SECTION    . CESAREAN SECTION WITH BILATERAL TUBAL LIGATION  01/28/2012   Procedure: CESAREAN SECTION WITH BILATERAL TUBAL LIGATION;  Surgeon: Alwyn Pea, MD;  Location: [facility];  Service: Obstetrics;  Laterality: Bilateral;  Repeat C/S - POSSIBLE BTL  . salpyngooophorectomy     LSO 2005 for TOA    Current Medications: Current Meds  Medication Sig  . fluticasone (FLONASE) 50 MCG/ACT nasal spray Place into both nostrils daily.  Marland Kitchen gabapentin (NEURONTIN) 300 MG capsule Take 300 mg by mouth 3 (three) times daily.  . montelukast (SINGULAIR) 10 MG tablet Take 10 mg by mouth at bedtime.  Marland Kitchen NIFEdipine (PROCARDIA-XL/ADALAT-CC/NIFEDICAL-XL) 30 MG 24 hr tablet Take 30 mg by mouth daily.   . norethindrone (AYGESTIN) 5 MG tablet 1 tab TID until bleeding improves, then can decrease to BID  . potassium chloride (KLOR-CON) 20 MEQ packet Take 20 mEq by mouth daily.  Marland Kitchen triamterene-hydrochlorothiazide (MAXZIDE-25) 37.5-25 MG tablet Take 1 tablet by mouth daily.     Allergies:   Bee venom and Penicillins   Social History   Socioeconomic History  . Marital status: Married    Spouse name: CHRISTOPHER  . Number of children: 1  . Years of education: 34  . Highest education level: Not on file  Occupational History  . Occupation: Product manager: [employer]  Social Needs  . Financial resource strain: Not on file  . Food insecurity:    Worry: Not on file    Inability: Not on file  . Transportation needs:    Medical: Not on file    Non-medical: Not on file  Tobacco Use  . Smoking status: Never Smoker  . Smokeless tobacco: Never Used  Substance and Sexual Activity  . Alcohol use: No  . Drug use: No  . Sexual activity: Not on file  Lifestyle  . Physical activity:    Days per week: Not on file    Minutes per session: Not on file  . Stress: Not on file   Relationships  . Social connections:    Talks on phone: Not on file    Gets together: Not on file    Attends religious service: Not on file    Active member of club or organization: Not on file    Attends meetings of clubs or organizations: Not on file    Relationship status: Not on file  Other Topics Concern  . Not on file  Social History Narrative  . Not on file     Family History: The patient's family history includes Asthma in her daughter; Diabetes in her father and maternal grandfather; Heart disease in her father, maternal grandfather, and mother; Hypertension in her maternal grandmother; Lupus in her sister; Stroke in her maternal grandmother. ROS:  Review of Systems  Constitution: Negative.  HENT: Negative.  Eyes: Positive for visual disturbance (migraine).  Cardiovascular: Positive for palpitations (rapid at night).  Respiratory: Negative.   Endocrine: Negative.   Hematologic/Lymphatic: Negative.   Skin: Negative.   Musculoskeletal: Negative.   Gastrointestinal: Negative.   Genitourinary: Negative.   Neurological: Negative.   Psychiatric/Behavioral: Negative.   Allergic/Immunologic: Negative.    Please see the history of present illness.    All other systems reviewed and are negative.  EKGs/Labs/Other Studies Reviewed:    The following studies were reviewed today:  EKG:  EKG ordered today and personally reviewed.  The ekg ordered today demonstrates [facility] minor non specific T waves OW normal  Recent Labs: 07/14/2018: BUN 10; Creatinine, Ser 1.05; Hemoglobin 9.6; Platelets 355; Potassium 3.7; Sodium 137  Recent Lipid Panel No results found for: CHOL, TRIG, HDL, CHOLHDL, VLDL, LDLCALC, LDLDIRECT  Physical Exam:    VS:  BP 124/86 (BP Location: Right Arm, Patient Position: Sitting)   Pulse 94   Temp (!) 95.4 F (35.2 C)   Ht 5\' 5"  (1.651 m)   Wt 182 lb 1.3 oz (82.6 kg)   BMI 30.30 kg/m     Wt Readings from Last 3 Encounters:  07/15/18 182 lb 1.3 oz (82.6  kg)  06/09/18 179 lb (81.2 kg)  06/04/16 185 lb (83.9 kg)     GEN:  Well nourished, well developed in no acute distress HEENT: Normal NECK: No JVD; No carotid bruits LYMPHATICS: No lymphadenopathy CARDIAC: RRR, no murmurs, rubs, gallops RESPIRATORY:  Clear to auscultation without rales, wheezing or rhonchi  ABDOMEN: Soft, non-tender, non-distended MUSCULOSKELETAL:  No edema; No deformity  SKIN: Warm and dry NEUROLOGIC:  Alert and oriented x 3 PSYCHIATRIC:  Normal affect    Signed, Shirlee More, MD  07/15/2018 2:47 PM    [place]

## 2018-07-16 LAB — NOVEL CORONAVIRUS, NAA (HOSP ORDER, SEND-OUT TO REF LAB; TAT 18-24 HRS): SARS-CoV-2, NAA: NOT DETECTED

## 2018-07-16 MED ORDER — GENTAMICIN SULFATE 40 MG/ML IJ SOLN
1.5000 mg/kg | Freq: Once | INTRAVENOUS | Status: AC
  Administered 2018-07-17: 120 mg via INTRAVENOUS
  Filled 2018-07-16 (×2): qty 3

## 2018-07-16 NOTE — Addendum Note (Signed)
Addended by: Austin Miles on: 07/16/2018 09:47 AM   Modules accepted: Orders

## 2018-07-16 NOTE — Anesthesia Preprocedure Evaluation (Addendum)
Anesthesia Evaluation  Patient identified by MRN, date of birth, ID band Patient awake    Reviewed: Allergy & Precautions, NPO status , Patient's Chart, lab work & pertinent test results  Airway Mallampati: II  TM Distance: >3 FB Neck ROM: Full    Dental no notable dental hx. (+) Teeth Intact   Pulmonary neg pulmonary ROS,    Pulmonary exam normal breath sounds clear to auscultation       Cardiovascular Exercise Tolerance: Good hypertension, negative cardio ROS Normal cardiovascular exam Rhythm:Regular Rate:Normal     Neuro/Psych negative psych ROS   GI/Hepatic negative GI ROS,   Endo/Other    Renal/GU      Musculoskeletal   Abdominal   Peds  Hematology  (+) anemia ,   Anesthesia Other Findings   Reproductive/Obstetrics                            Anesthesia Physical Anesthesia Plan  ASA: II  Anesthesia Plan: General   Post-op Pain Management:    Induction: Intravenous  PONV Risk Score and Plan: Treatment may vary due to age or medical condition, Ondansetron and Dexamethasone  Airway Management Planned: LMA  Additional Equipment:   Intra-op Plan:   Post-operative Plan:   Informed Consent: I have reviewed the patients History and Physical, chart, labs and discussed the procedure including the risks, benefits and alternatives for the proposed anesthesia with the patient or authorized representative who has indicated his/her understanding and acceptance.     Dental advisory given  Plan Discussed with: CRNA  Anesthesia Plan Comments:        Anesthesia Quick Evaluation

## 2018-07-17 ENCOUNTER — Encounter (HOSPITAL_BASED_OUTPATIENT_CLINIC_OR_DEPARTMENT_OTHER)
Admission: RE | Disposition: A | Discharge status: Home / Self Care | Payer: Self-pay | Source: Home / Self Care | Attending: Obstetrics and Gynecology

## 2018-07-17 ENCOUNTER — Ambulatory Visit (HOSPITAL_BASED_OUTPATIENT_CLINIC_OR_DEPARTMENT_OTHER): Payer: BC Managed Care – PPO | Admitting: Anesthesiology

## 2018-07-17 ENCOUNTER — Other Ambulatory Visit: Payer: Self-pay

## 2018-07-17 ENCOUNTER — Ambulatory Visit (HOSPITAL_BASED_OUTPATIENT_CLINIC_OR_DEPARTMENT_OTHER)
Admission: RE | Admit: 2018-07-17 | Discharge: 2018-07-17 | Disposition: A | Discharge status: Home / Self Care | Payer: BC Managed Care – PPO | Attending: Obstetrics and Gynecology | Admitting: Obstetrics and Gynecology

## 2018-07-17 ENCOUNTER — Encounter (HOSPITAL_BASED_OUTPATIENT_CLINIC_OR_DEPARTMENT_OTHER): Payer: Self-pay | Admitting: Anesthesiology

## 2018-07-17 DIAGNOSIS — Z79899 Other long term (current) drug therapy: Secondary | ICD-10-CM | POA: Insufficient documentation

## 2018-07-17 DIAGNOSIS — D649 Anemia, unspecified: Secondary | ICD-10-CM | POA: Insufficient documentation

## 2018-07-17 DIAGNOSIS — N920 Excessive and frequent menstruation with regular cycle: Secondary | ICD-10-CM | POA: Diagnosis not present

## 2018-07-17 DIAGNOSIS — D259 Leiomyoma of uterus, unspecified: Secondary | ICD-10-CM | POA: Insufficient documentation

## 2018-07-17 DIAGNOSIS — D219 Benign neoplasm of connective and other soft tissue, unspecified: Secondary | ICD-10-CM

## 2018-07-17 DIAGNOSIS — I10 Essential (primary) hypertension: Secondary | ICD-10-CM | POA: Insufficient documentation

## 2018-07-17 DIAGNOSIS — D500 Iron deficiency anemia secondary to blood loss (chronic): Secondary | ICD-10-CM

## 2018-07-17 HISTORY — PX: DILITATION & CURRETTAGE/HYSTROSCOPY WITH HYDROTHERMAL ABLATION: SHX5570

## 2018-07-17 HISTORY — PX: HYSTEROSCOPY WITH NOVASURE: SHX5574

## 2018-07-17 SURGERY — DILATATION & CURETTAGE/HYSTEROSCOPY WITH HYDROTHERMAL ABLATION
Anesthesia: General | Site: Uterus

## 2018-07-17 MED ORDER — ACETAMINOPHEN 10 MG/ML IV SOLN: 1000.0000 mg | Freq: Once | INTRAVENOUS | Status: DC | PRN

## 2018-07-17 MED ORDER — CHLOROPROCAINE HCL 1 % IJ SOLN
INTRAMUSCULAR | Status: AC
  Filled 2018-07-17: qty 30

## 2018-07-17 MED ORDER — SCOPOLAMINE 1 MG/3DAYS TD PT72: 1.0000 | MEDICATED_PATCH | Freq: Once | TRANSDERMAL | Status: DC | PRN

## 2018-07-17 MED ORDER — LIDOCAINE HCL (PF) 1 % IJ SOLN
INTRAMUSCULAR | Status: AC
  Filled 2018-07-17: qty 30

## 2018-07-17 MED ORDER — ONDANSETRON HCL 4 MG/2ML IJ SOLN
INTRAMUSCULAR | Status: DC | PRN
  Administered 2018-07-17: 4 mg via INTRAVENOUS

## 2018-07-17 MED ORDER — HYDROMORPHONE HCL 1 MG/ML IJ SOLN
INTRAMUSCULAR | Status: AC
  Filled 2018-07-17: qty 0.5

## 2018-07-17 MED ORDER — HYDROMORPHONE HCL 1 MG/ML IJ SOLN
0.2500 mg | INTRAMUSCULAR | Status: DC | PRN
  Administered 2018-07-17: 0.5 mg via INTRAVENOUS

## 2018-07-17 MED ORDER — HYDROCODONE-ACETAMINOPHEN 7.5-325 MG PO TABS: 1.0000 | ORAL_TABLET | Freq: Once | ORAL | Status: DC | PRN

## 2018-07-17 MED ORDER — IBUPROFEN 600 MG PO TABS: 600.0000 mg | ORAL_TABLET | Freq: Four times a day (QID) | ORAL | 0 refills | Status: DC | PRN

## 2018-07-17 MED ORDER — FENTANYL CITRATE (PF) 100 MCG/2ML IJ SOLN
INTRAMUSCULAR | Status: AC
  Filled 2018-07-17: qty 2

## 2018-07-17 MED ORDER — MIDAZOLAM HCL 2 MG/2ML IJ SOLN
1.0000 mg | INTRAMUSCULAR | Status: DC | PRN
  Administered 2018-07-17: 11:00:00 2 mg via INTRAVENOUS

## 2018-07-17 MED ORDER — PHENYLEPHRINE HCL (PRESSORS) 10 MG/ML IV SOLN
INTRAVENOUS | Status: DC | PRN
  Administered 2018-07-17: 80 ug via INTRAVENOUS

## 2018-07-17 MED ORDER — MEPERIDINE HCL 25 MG/ML IJ SOLN: 6.2500 mg | INTRAMUSCULAR | Status: DC | PRN

## 2018-07-17 MED ORDER — LACTATED RINGERS IV SOLN
INTRAVENOUS | Status: DC
  Administered 2018-07-17 (×2): via INTRAVENOUS

## 2018-07-17 MED ORDER — ONDANSETRON HCL 4 MG/2ML IJ SOLN: 4.0000 mg | Freq: Once | INTRAMUSCULAR | Status: DC | PRN

## 2018-07-17 MED ORDER — KETOROLAC TROMETHAMINE 30 MG/ML IJ SOLN
INTRAMUSCULAR | Status: DC | PRN
  Administered 2018-07-17: 30 mg via INTRAVENOUS

## 2018-07-17 MED ORDER — CHLOROPROCAINE HCL 1 % IJ SOLN
INTRAMUSCULAR | Status: DC | PRN
  Administered 2018-07-17: 10 mL

## 2018-07-17 MED ORDER — MIDAZOLAM HCL 2 MG/2ML IJ SOLN
INTRAMUSCULAR | Status: AC
  Filled 2018-07-17: qty 2

## 2018-07-17 MED ORDER — LIDOCAINE HCL (CARDIAC) PF 100 MG/5ML IV SOSY
PREFILLED_SYRINGE | INTRAVENOUS | Status: DC | PRN
  Administered 2018-07-17: 100 mg via INTRAVENOUS

## 2018-07-17 MED ORDER — DEXAMETHASONE SODIUM PHOSPHATE 4 MG/ML IJ SOLN
INTRAMUSCULAR | Status: DC | PRN
  Administered 2018-07-17: 10 mg via INTRAVENOUS

## 2018-07-17 MED ORDER — FENTANYL CITRATE (PF) 100 MCG/2ML IJ SOLN
50.0000 ug | INTRAMUSCULAR | Status: DC | PRN
  Administered 2018-07-17 (×2): 50 ug via INTRAVENOUS

## 2018-07-17 MED ORDER — EPHEDRINE SULFATE 50 MG/ML IJ SOLN
INTRAMUSCULAR | Status: DC | PRN
  Administered 2018-07-17: 10 mg via INTRAVENOUS

## 2018-07-17 SURGICAL SUPPLY — 20 items
ABLATOR SURESOUND NOVASURE (ABLATOR) ×2 IMPLANT
BOOTIES KNEE HIGH SLOAN (MISCELLANEOUS) ×2 IMPLANT
BRIEF STRETCH MATERNITY 2XLG (MISCELLANEOUS) ×2 IMPLANT
CANISTER SUCT 3000ML PPV (MISCELLANEOUS) ×3 IMPLANT
CATH ROBINSON RED A/P 16FR (CATHETERS) ×3 IMPLANT
ENDOLOOP SUT PDS II  0 18 (SUTURE) ×2
ENDOLOOP SUT PDS II 0 18 (SUTURE) IMPLANT
GAUZE XEROFORM 5X9 LF (GAUZE/BANDAGES/DRESSINGS) ×2 IMPLANT
GLOVE BIOGEL PI IND STRL 7.0 (GLOVE) ×2 IMPLANT
GLOVE BIOGEL PI INDICATOR 7.0 (GLOVE) ×4
GOWN STRL REUS W/ TWL LRG LVL3 (GOWN DISPOSABLE) ×3 IMPLANT
GOWN STRL REUS W/TWL LRG LVL3 (GOWN DISPOSABLE) ×6
HIBICLENS CHG 4% 4OZ BTL (MISCELLANEOUS) ×1 IMPLANT
KIT PROCEDURE FLUENT (KITS) ×2 IMPLANT
PACK VAGINAL MINOR WOMEN LF (CUSTOM PROCEDURE TRAY) ×3 IMPLANT
PAD OB MATERNITY 4.3X12.25 (PERSONAL CARE ITEMS) ×3 IMPLANT
SET GENESYS HTA PROCERVA (MISCELLANEOUS) ×3 IMPLANT
SUT VIC AB 2-0 CT1 27 (SUTURE) ×12
SUT VIC AB 2-0 CT1 TAPERPNT 27 (SUTURE) IMPLANT
TOWEL GREEN STERILE FF (TOWEL DISPOSABLE) ×3 IMPLANT

## 2018-07-17 NOTE — Op Note (Signed)
Preop diagnosis: uterine fibroids with menorrhagia and anemia  Postop diagnosis: same  Anesthesia: IV sedation with LMA  Anesthesiologist: Dr. Valma Cava  Procedure: Hysteroscopy, D&C, failed HTA ablation and Novasure endometrial ablation  Surgeon: Dr. Katharine Look Hairo Garraway   After being informed of the planned procedure with possible complications including bleeding, infection and uterine perforation, informed consent was obtained and patient was taken to or #3.  She was given IV anesthesia without complication. She was placed in a dorsal decubitus position, prepped and draped in the sterile fashion and her bladder was emptied with red rubber catheter.   Pelvic exam reveals:12 week size uterus with 2 normal adnexa   A weighted speculum is inserted in the vagina. The cervix was grasped with a tenaculum forcep placed on the anterior lip.We proceed with a paracervical block using 1% Nesacaine, 20 cc. Uterus is sounded at 10 cm. The cervix is measured at 3.5 cm and then easily dilated using Hegar dilator until # 23. This allows for easy placement of a diagnostic hysteroscope. With perfusion of Normal saline at a maximum pressure of 90 mmHg, we are able to evaluate the entire uterine cavity.  Observation: The endometrium is thin from prolonged progestin therapy. Both tubal ostia are visualized. A large clot was removed in order to fully see the cavity.  We then removed our instrumentation. Using a sharp curette, we proceed with curettage of the endometrial cavity which returns a small amount of normal-appearing endometrium.  The HTA device is then prompted to proceed with ablation after completion of diagnostic hysteroscopy. Unfortunately, despite multiple maneuvers including double tenaculum placement, vaginal packing with vaseline gauze, Endoloop placement around the cervix, the cavity integrity check continues to fail.  We remove the HTA scope and proceed with a repeat diagnostic hysteroscopy to confirm  an intact cavity without submucosal fibroid. Decision is made to proceed with Novasure ablation, having confirmed a 6.5 total cavity length.  The Novasure device was easily inserted in the uterine cavity and deployed.  Cavity length:  6.5cm Cavity width: 3 cm Time: 34 seconds  The diagnostic hysteroscope was reinserted to visualize a completely blanched and intact cavity.  We repair all cervical lacerations with 2-0 Vicryl.  Instruments are then removed. Instrument and sponge count is complete x2. Estimated blood loss is 100. Water deficit is 200 cc of NS.  The procedure is very well tolerated by the patient who is taken to recovery room in a well and stable condition.  Specimen:  endometrial curettings sent to pathology.

## 2018-07-17 NOTE — Anesthesia Procedure Notes (Signed)
Procedure Name: LMA Insertion Date/Time: 07/17/2018 10:36 AM Performed by: Maryella Shivers, CRNA Pre-anesthesia Checklist: Patient identified, Emergency Drugs available, Suction available and Patient being monitored Patient Re-evaluated:Patient Re-evaluated prior to induction Oxygen Delivery Method: Circle system utilized Preoxygenation: Pre-oxygenation with 100% oxygen Induction Type: IV induction Ventilation: Mask ventilation without difficulty LMA: LMA inserted LMA Size: 4.0 Number of attempts: 1 Airway Equipment and Method: Bite block Placement Confirmation: positive ETCO2 Tube secured with: Tape Dental Injury: Teeth and Oropharynx as per pre-operative assessment

## 2018-07-17 NOTE — Discharge Instructions (Signed)
POST-OPERATIVE INSTRUCTIONS TO PATIENT  Call [facility]  [phone])  for excessive pain, bleeding or temperature greater than or equal to 100.4 degrees (orally).    No driving for 1 day  No sexual activity for 2 weeks  Pain management:  Use Ibuprofen 600 mg every 6 hours for 3 days and then as needed. Use your pain medication as needed to maintain a pain level at or below 3/10 Use Colace 1-2 capsules per day as long as you are using pain medication to avoid constipation.       Diet: normal  Bathing: may shower day after surgery   Return to Dr. Cletis Media on as scheduled      Dede Query Rivard MD 07/17/2018 1:16 PM   No Motrin untl after 7pm.     Post Anesthesia Home Care Instructions  Activity: Get plenty of rest for the remainder of the day. A responsible individual must stay with you for 24 hours following the procedure.  For the next 24 hours, DO NOT: -Drive a car -Paediatric nurse -Drink alcoholic beverages -Take any medication unless instructed by your physician -Make any legal decisions or sign important papers.  Meals: Start with liquid foods such as gelatin or soup. Progress to regular foods as tolerated. Avoid greasy, spicy, heavy foods. If nausea and/or vomiting occur, drink only clear liquids until the nausea and/or vomiting subsides. Call your physician if vomiting continues.  Special Instructions/Symptoms: Your throat may feel dry or sore from the anesthesia or the breathing tube placed in your throat during surgery. If this causes discomfort, gargle with warm salt water. The discomfort should disappear within 24 hours.  If you had a scopolamine patch placed behind your ear for the management of post- operative nausea and/or vomiting:  1. The medication in the patch is effective for 72 hours, after which it should be removed.  Wrap patch in a tissue and discard in the trash. Wash hands thoroughly with soap and water. 2. You may remove the  patch earlier than 72 hours if you experience unpleasant side effects which may include dry mouth, dizziness or visual disturbances. 3. Avoid touching the patch. Wash your hands with soap and water after contact with the patch.

## 2018-07-17 NOTE — Anesthesia Postprocedure Evaluation (Signed)
Anesthesia Post Note  Patient: Terry Bryant  Procedure(s) Performed: DILATATION & CURETTAGE/HYSTEROSCOPY WITH FAILED HYDROTHERMAL ABLATION (N/A Uterus) HYSTEROSCOPY WITH NOVASURE (N/A Uterus)     Patient location during evaluation: PACU Anesthesia Type: General Level of consciousness: awake and alert Pain management: pain level controlled Vital Signs Assessment: post-procedure vital signs reviewed and stable Respiratory status: spontaneous breathing, nonlabored ventilation, respiratory function stable and patient connected to nasal cannula oxygen Cardiovascular status: blood pressure returned to baseline and stable Postop Assessment: no apparent nausea or vomiting Anesthetic complications: no    Last Vitals:  Vitals:   07/17/18 1345 07/17/18 1355  BP: 122/83 126/84  Pulse: 79 83  Resp: 15 16  Temp:  36.9 C  SpO2: 100% 100%    Last Pain:  Vitals:   07/17/18 1355  TempSrc:   PainSc: [place]

## 2018-07-17 NOTE — Transfer of Care (Signed)
Immediate Anesthesia Transfer of Care Note  Patient: Terry Bryant  Procedure(s) Performed: DILATATION & CURETTAGE/HYSTEROSCOPY WITH HYDROTHERMAL ABLATION, NOVASURE (N/A Uterus) HYSTEROSCOPY WITH NOVASURE (N/A Vagina )  Patient Location: PACU  Anesthesia Type:General  Level of Consciousness: awake, alert  and oriented  Airway & Oxygen Therapy: Patient Spontanous Breathing and Patient connected to face mask oxygen  Post-op Assessment: Report given to RN and Post -op Vital signs reviewed and stable  Post vital signs: Reviewed and stable  Last Vitals:  Vitals Value Taken Time  BP 130/87 07/17/2018  1:15 PM  Temp    Pulse 93 07/17/2018  1:18 PM  Resp 11 07/17/2018  1:18 PM  SpO2 100 % 07/17/2018  1:18 PM    Last Pain:  Vitals:   07/17/18 0932  TempSrc: Oral  PainSc: 6       Patients Stated Pain Goal: 3 (44/[id number])  Complications: No apparent anesthesia complications

## 2018-07-17 NOTE — Interval H&P Note (Signed)
History and Physical Interval Note:  07/17/2018 10:11 AM  Terry Bryant  has presented today for surgery, with the diagnosis of Fibroids, Menorrhagia and Anemia.  The various methods of treatment have been discussed with the patient and family. After consideration of risks, benefits and other options for treatment, the patient has consented to  Procedure(s): DILATATION & CURETTAGE/HYSTEROSCOPY WITH HYDROTHERMAL ABLATION (N/A) as a surgical intervention.  The patient's history has been reviewed, patient examined, no change in status, stable for surgery.  I have reviewed the patient's chart and labs.  Questions were answered to the patient's satisfaction.     Katharine Look A Emy Angevine

## 2018-07-18 ENCOUNTER — Encounter (HOSPITAL_BASED_OUTPATIENT_CLINIC_OR_DEPARTMENT_OTHER): Payer: Self-pay | Admitting: Obstetrics and Gynecology

## 2018-07-22 ENCOUNTER — Other Ambulatory Visit (INDEPENDENT_AMBULATORY_CARE_PROVIDER_SITE_OTHER): Payer: BC Managed Care – PPO

## 2018-07-22 DIAGNOSIS — R002 Palpitations: Secondary | ICD-10-CM

## 2018-07-22 DIAGNOSIS — R9431 Abnormal electrocardiogram [ECG] [EKG]: Secondary | ICD-10-CM | POA: Diagnosis not present

## 2018-08-26 DIAGNOSIS — G43109 Migraine with aura, not intractable, without status migrainosus: Secondary | ICD-10-CM

## 2018-08-26 HISTORY — DX: Migraine with aura, not intractable, without status migrainosus: G43.109

## 2018-09-01 ENCOUNTER — Ambulatory Visit: Payer: BC Managed Care – PPO | Admitting: Cardiology

## 2018-09-01 NOTE — Progress Notes (Signed)
Cardiology Office Note:    Date:  09/02/2018   ID:  Terry Bryant, DOB [birth year], MRN [id number]  PCP:  Jefm Petty, MD  Cardiologist:  Shirlee More, MD    Referring MD: Jefm Petty, MD    ASSESSMENT:    1. Abnormal electrocardiogram (ECG) (EKG)   2. Chest pain of uncertain etiology    PLAN:    In order of problems listed above:  1. Further evaluation including echocardiogram regarding cardiomyopathy cardiac CTA regarding CAD and a recheck of CBC with anemia that may be provocative for cardiac symptoms 2. Hypertension stable continue current treatment   Next appointment: Weeks   Medication Adjustments/Labs and Tests Ordered: Current medicines are reviewed at length with the patient today.  Concerns regarding medicines are outlined above.  No orders of the defined types were placed in this encounter.  No orders of the defined types were placed in this encounter.   Chief Complaint  Patient presents with  . Abnormal ECG  . Hypertension    History of Present Illness:    Terry Bryant is a 43 y.o. female with a hx of hypertension seen by me preoperatively before GYN surgery 06/02/2020with EKG with nonspecific T waves.  Other problems include iron deficiency anemia due to chronic blood loss. Compliance with diet, lifestyle and medications: Yes  after surgery I had asked Terry Bryant to return to the office. She is on iron we will recheck her CBC today. She has had palpitation not severe or sustained or frequent She had 2 episodes of nonexertional pain aching left upper chest unassociated with meals activities but relieved with rest.  Is caused her to be apprehensive is not pleuritic its not positional no shortness of breath and she has had no chest wall trauma.  Her EKG shows minor nonspecific ST-T changes and with her hypertension I have asked her undergo further evaluation including echocardiogram for cardiomyopathy and a cardiac CTA for CAD. Past Medical  History:  Diagnosis Date  . Anemia    TAKES FE OCC  . Blood transfusion 02-16-11   '05 with ovary surgery  . Blood transfusion without reported diagnosis   . Endometriosis of colon 2009  . Fibroids 2004  . Hypertension   . Hypertension 2007   [facility]   ON MED  . 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 Pneumonia AGE 79    Past Surgical History:  Procedure Laterality Date  . BREAST CYST EXCISION  02/22/2011   Procedure: CYST EXCISION BREAST;  Surgeon: Haywood Lasso, MD;  Location: [facility];  Service: General;  Laterality: Left;  Removal Left Breast Mass  . CESAREAN SECTION    . CESAREAN SECTION WITH BILATERAL TUBAL LIGATION  01/28/2012   Procedure: CESAREAN SECTION WITH BILATERAL TUBAL LIGATION;  Surgeon: Alwyn Pea, MD;  Location: [facility];  Service: Obstetrics;  Laterality: Bilateral;  Repeat C/S - POSSIBLE BTL  . DILITATION & CURRETTAGE/HYSTROSCOPY WITH HYDROTHERMAL ABLATION N/A 07/17/2018   Procedure: DILATATION & CURETTAGE/HYSTEROSCOPY WITH FAILED HYDROTHERMAL ABLATION;  Surgeon: Delsa Bern, MD;  Location: [place];  Service: Gynecology;  Laterality: N/A;  . HYSTEROSCOPY WITH NOVASURE N/A 07/17/2018   Procedure: HYSTEROSCOPY WITH NOVASURE;  Surgeon: Delsa Bern, MD;  Location: [place];  Service: Gynecology;  Laterality: N/A;  . salpyngooophorectomy     LSO 2005 for TOA    Current Medications: Current Meds  Medication Sig  . aspirin EC 81 MG tablet Take 81 mg by mouth daily as needed.  Marland Kitchen  fluticasone (FLONASE) 50 MCG/ACT nasal spray Place 1 spray into both nostrils daily as needed.   . gabapentin (NEURONTIN) 100 MG capsule Take 100 mg by mouth daily.   . montelukast (SINGULAIR) 10 MG tablet Take 10 mg by mouth at bedtime.  Marland Kitchen NIFEdipine (PROCARDIA-XL/ADALAT-CC/NIFEDICAL-XL) 30 MG 24 hr tablet Take 30 mg by mouth daily.   . potassium chloride (KLOR-CON) 20 MEQ packet Take 20 mEq by mouth daily.  Marland Kitchen SLYND 4 MG TABS TK 1 T PO QD  .  triamterene-hydrochlorothiazide (MAXZIDE-25) 37.5-25 MG tablet Take 0.5 tablets by mouth daily.      Allergies:   Bee venom and Penicillins   Social History   Socioeconomic History  . Marital status: Married    Spouse name: CHRISTOPHER  . Number of children: 1  . Years of education: 28  . Highest education level: Not on file  Occupational History  . Occupation: Product manager: [employer]  Social Needs  . Financial resource strain: Not on file  . Food insecurity    Worry: Not on file    Inability: Not on file  . Transportation needs    Medical: Not on file    Non-medical: Not on file  Tobacco Use  . Smoking status: Never Smoker  . Smokeless tobacco: Never Used  Substance and Sexual Activity  . Alcohol use: No  . Drug use: No  . Sexual activity: Not on file  Lifestyle  . Physical activity    Days per week: Not on file    Minutes per session: Not on file  . Stress: Not on file  Relationships  . Social Herbalist on phone: Not on file    Gets together: Not on file    Attends religious service: Not on file    Active member of club or organization: Not on file    Attends meetings of clubs or organizations: Not on file    Relationship status: Not on file  Other Topics Concern  . Not on file  Social History Narrative  . Not on file     Family History: The patient's family history includes Asthma in her daughter; Diabetes in her father and maternal grandfather; Heart disease in her father, maternal grandfather, and mother; Hypertension in her maternal grandmother; Lupus in her sister; Stroke in her maternal grandmother. ROS:   Please see the history of present illness.    All other systems reviewed and are negative.  EKGs/Labs/Other Studies Reviewed:    The following studies were reviewed today:  EKG:  EKG ordered today and personally reviewed.  The ekg ordered today demonstrates sinus rhythm nonspecific ST-T change  Recent Labs:  07/14/2018: BUN 10; Creatinine, Ser 1.05; Hemoglobin 9.6; Platelets 355; Potassium 3.7; Sodium 137  Recent Lipid Panel No results found for: CHOL, TRIG, HDL, CHOLHDL, VLDL, LDLCALC, LDLDIRECT  Physical Exam:    VS:  BP 122/84 (BP Location: Right Arm, Patient Position: Sitting, Cuff Size: Normal)   Pulse 84   Temp 97.7 F (36.5 C)   Ht 5\' 6"  (1.676 m)   Wt 180 lb 6.4 oz (81.8 kg)   SpO2 98%   BMI 29.12 kg/m     Wt Readings from Last 3 Encounters:  09/02/18 180 lb 6.4 oz (81.8 kg)  07/17/18 180 lb 12.4 oz (82 kg)  07/15/18 182 lb 1.3 oz (82.6 kg)     GEN:  Well nourished, well developed in no acute distress HEENT: Normal NECK: No JVD;  No carotid bruits LYMPHATICS: No lymphadenopathy CARDIAC: RRR, no murmurs, rubs, gallops RESPIRATORY:  Clear to auscultation without rales, wheezing or rhonchi  ABDOMEN: Soft, non-tender, non-distended MUSCULOSKELETAL:  No edema; No deformity  SKIN: Warm and dry NEUROLOGIC:  Alert and oriented x 3 PSYCHIATRIC:  Normal affect    Signed, Shirlee More, MD  09/02/2018 4:00 PM    [facility]

## 2018-09-02 ENCOUNTER — Ambulatory Visit (INDEPENDENT_AMBULATORY_CARE_PROVIDER_SITE_OTHER): Payer: BC Managed Care – PPO | Admitting: Cardiology

## 2018-09-02 ENCOUNTER — Other Ambulatory Visit: Payer: Self-pay

## 2018-09-02 ENCOUNTER — Encounter: Payer: Self-pay | Admitting: Cardiology

## 2018-09-02 VITALS — BP 122/84 | HR 84 | Temp 97.7°F | Ht 66.0 in | Wt 180.4 lb

## 2018-09-02 DIAGNOSIS — R0789 Other chest pain: Secondary | ICD-10-CM | POA: Diagnosis not present

## 2018-09-02 DIAGNOSIS — Z01812 Encounter for preprocedural laboratory examination: Secondary | ICD-10-CM | POA: Diagnosis not present

## 2018-09-02 DIAGNOSIS — R9431 Abnormal electrocardiogram [ECG] [EKG]: Secondary | ICD-10-CM | POA: Diagnosis not present

## 2018-09-02 DIAGNOSIS — R079 Chest pain, unspecified: Secondary | ICD-10-CM

## 2018-09-02 MED ORDER — METOPROLOL TARTRATE 50 MG PO TABS: 50.0000 mg | ORAL_TABLET | Freq: Two times a day (BID) | ORAL | 0 refills | Status: DC

## 2018-09-02 NOTE — Patient Instructions (Addendum)
Medication Instructions:  Your physician recommends that you continue on your current medications as directed. Please refer to the Current Medication list given to you today.  If you need a refill on your cardiac medications before your next appointment, please call your pharmacy.   Lab work: Your physician recommends that you return for lab work in:  TODAY : CBC  3-7 days prior to CT: BMP  If you have labs (blood work) drawn today and your tests are completely normal, you will receive your results only by: Marland Kitchen MyChart Message (if you have MyChart) OR . A paper copy in the mail If you have any lab test that is abnormal or we need to change your treatment, we will call you to review the results.  Testing/Procedures: Your physician has requested that you have an echocardiogram. Echocardiography is a painless test that uses sound waves to create images of your heart. It provides your doctor with information about the size and shape of your heart and how well your heart's chambers and valves are working. This procedure takes approximately one hour. There are no restrictions for this procedure.    Your physician has requested that you have cardiac CT. Cardiac computed tomography (CT) is a painless test that uses an x-ray machine to take clear, detailed pictures of your heart. For further information please visit [employer]. Please follow instruction sheet as given. Please arrive at the [facility] main entrance of [facility] at xx:xx AM (30-45 minutes prior to test start time)  [facility] [place], [place] [address] [phone]  Proceed to the [facility] Radiology Department (First Floor).  Please follow these instructions carefully (unless otherwise directed):  Hold all erectile dysfunction medications at least 48 hours prior to test.  On the Night Before the Test: . Be sure to Drink plenty of water. . Do not consume any  caffeinated/decaffeinated beverages or chocolate 12 hours prior to your test. . Do not take any antihistamines 12 hours prior to your test.   On the Day of the Test: . Drink plenty of water. Do not drink any water within one hour of the test. . Do not eat any food 4 hours prior to the test. . You may take your regular medications prior to the test.  . Take metoprolol two hours prior to test. . HOLD Maxide morning of the test.    After the Test: . Drink plenty of water. . After receiving IV contrast, you may experience a mild flushed feeling. This is normal. . On occasion, you may experience a mild rash up to 24 hours after the test. This is not dangerous. If this occurs, you can take Benadryl 25 mg and increase your fluid intake. . If you experience trouble breathing, this can be serious. If it is severe call 911 IMMEDIATELY. If it is mild, please call our office.    Follow-Up: At [facility], you and your health needs are our priority.  As part of our continuing mission to provide you with exceptional heart care, we have created designated Provider Care Teams.  These Care Teams include your primary Cardiologist (physician) and Advanced Practice Providers (APPs -  Physician Assistants and Nurse Practitioners) who all work together to provide you with the care you need, when you need it. You will need a follow up appointment in 6 weeks.  Any Other Special Instructions Will Be Listed Below (If Applicable).   Cardiac CT Angiogram  A cardiac CT angiogram  is a procedure to look at the heart and the area around the heart. It may be done to help find the cause of chest pains or other symptoms of heart disease. During this procedure, a large X-ray machine, called a CT scanner, takes detailed pictures of the heart and the surrounding area after a dye (contrast material) has been injected into blood vessels in the area. The procedure is also sometimes called a coronary CT angiogram, coronary  artery scanning, or CTA. A cardiac CT angiogram allows the health care provider to see how well blood is flowing to and from the heart. The health care provider will be able to see if there are any problems, such as:  Blockage or narrowing of the coronary arteries in the heart.  Fluid around the heart.  Signs of weakness or disease in the muscles, valves, and tissues of the heart. Tell a health care provider about:  Any allergies you have. This is especially important if you have had a previous allergic reaction to contrast dye.  All medicines you are taking, including vitamins, herbs, eye drops, creams, and over-the-counter medicines.  Any blood disorders you have.  Any surgeries you have had.  Any medical conditions you have.  Whether you are pregnant or may be pregnant.  Any anxiety disorders, chronic pain, or other conditions you have that may increase your stress or prevent you from lying still. What are the risks? Generally, this is a safe procedure. However, problems may occur, including:  Bleeding.  Infection.  Allergic reactions to medicines or dyes.  Damage to other structures or organs.  Kidney damage from the dye or contrast that is used.  Increased risk of cancer from radiation exposure. This risk is low. Talk with your health care provider about: ? The risks and benefits of testing. ? How you can receive the lowest dose of radiation. What happens before the procedure?  Wear comfortable clothing and remove any jewelry, glasses, dentures, and hearing aids.  Follow instructions from your health care provider about eating and drinking. This may include: ? For 12 hours before the test - avoid caffeine. This includes tea, coffee, soda, energy drinks, and diet pills. Drink plenty of water or other fluids that do not have caffeine in them. Being well-hydrated can prevent complications. ? For 4-6 hours before the test - stop eating and drinking. The contrast dye can  cause nausea, but this is less likely if your stomach is empty.  Ask your health care provider about changing or stopping your regular medicines. This is especially important if you are taking diabetes medicines, blood thinners, or medicines to treat erectile dysfunction. What happens during the procedure?  Hair on your chest may need to be removed so that small sticky patches called electrodes can be placed on your chest. These will transmit information that helps to monitor your heart during the test.  An IV tube will be inserted into one of your veins.  You might be given a medicine to control your heart rate during the test. This will help to ensure that good images are obtained.  You will be asked to lie on an exam table. This table will slide in and out of the CT machine during the procedure.  Contrast dye will be injected into the IV tube. You might feel warm, or you may get a metallic taste in your mouth.  You will be given a medicine (nitroglycerin) to relax (dilate) the arteries in your heart.  The table that  you are lying on will move into the CT machine tunnel for the scan.  The person running the machine will give you instructions while the scans are being done. You may be asked to: ? Keep your arms above your head. ? Hold your breath. ? Stay very still, even if the table is moving.  When the scanning is complete, you will be moved out of the machine.  The IV tube will be removed. The procedure may vary among health care providers and hospitals. What happens after the procedure?  You might feel warm, or you may get a metallic taste in your mouth from the contrast dye.  You may have a headache from the nitroglycerin.  After the procedure, drink water or other fluids to wash (flush) the contrast material out of your body.  Contact a health care provider if you have any symptoms of allergy to the contrast. These symptoms include: ? Shortness of breath. ? Rash or hives.  ? A racing heartbeat.  Most people can return to their normal activities right after the procedure. Ask your health care provider what activities are safe for you.  It is up to you to get the results of your procedure. Ask your health care provider, or the department that is doing the procedure, when your results will be ready. Summary  A cardiac CT angiogram is a procedure to look at the heart and the area around the heart. It may be done to help find the cause of chest pains or other symptoms of heart disease.  During this procedure, a large X-ray machine, called a CT scanner, takes detailed pictures of the heart and the surrounding area after a dye (contrast material) has been injected into blood vessels in the area.  Ask your health care provider about changing or stopping your regular medicines before the procedure. This is especially important if you are taking diabetes medicines, blood thinners, or medicines to treat erectile dysfunction.  After the procedure, drink water or other fluids to wash (flush) the contrast material out of your body. This information is not intended to replace advice given to you by your health care provider. Make sure you discuss any questions you have with your health care provider. Document Released: 01/12/2008 Document Revised: 01/11/2017 Document Reviewed: 12/19/2015 Elsevier Patient Education  2020 [employer].

## 2018-09-03 LAB — CBC
Hematocrit: 30.6 % — ABNORMAL LOW (ref 34.0–46.6)
Hemoglobin: 8.9 g/dL — ABNORMAL LOW (ref 11.1–15.9)
MCH: 21.3 pg — ABNORMAL LOW (ref 26.6–33.0)
MCHC: 29.1 g/dL — ABNORMAL LOW (ref 31.5–35.7)
MCV: 73 fL — ABNORMAL LOW (ref 79–97)
Platelets: 335 10*3/uL (ref 150–450)
RBC: 4.18 x10E6/uL (ref 3.77–5.28)
RDW: 14.8 % (ref 11.7–15.4)
WBC: 5.4 10*3/uL (ref 3.4–10.8)

## 2018-09-10 ENCOUNTER — Telehealth (HOSPITAL_COMMUNITY): Payer: Self-pay | Admitting: Emergency Medicine

## 2018-09-10 NOTE — Telephone Encounter (Signed)
pts voicemail responds with "nothing was recorded.. "  Unable to leave voicemail for call back  Marchia Bond RN Navigator Cardiac Imaging [facility] Vascular Services [phone] Office  [phone] Cell

## 2018-09-11 ENCOUNTER — Ambulatory Visit (HOSPITAL_BASED_OUTPATIENT_CLINIC_OR_DEPARTMENT_OTHER)
Admission: RE | Admit: 2018-09-11 | Discharge: 2018-09-11 | Disposition: A | Discharge status: Home / Self Care | Payer: BC Managed Care – PPO | Source: Ambulatory Visit | Attending: Cardiology | Admitting: Cardiology

## 2018-09-11 ENCOUNTER — Other Ambulatory Visit: Payer: Self-pay

## 2018-09-11 DIAGNOSIS — R0789 Other chest pain: Secondary | ICD-10-CM | POA: Diagnosis present

## 2018-09-11 NOTE — Progress Notes (Signed)
  Echocardiogram 2D Echocardiogram has been performed.  Cardell Peach 09/11/2018, 1:37 PM

## 2018-09-12 ENCOUNTER — Ambulatory Visit (HOSPITAL_COMMUNITY)
Admission: RE | Admit: 2018-09-12 | Discharge: 2018-09-12 | Disposition: A | Discharge status: Home / Self Care | Payer: BC Managed Care – PPO | Source: Ambulatory Visit | Attending: Cardiology | Admitting: Cardiology

## 2018-09-12 ENCOUNTER — Ambulatory Visit (HOSPITAL_COMMUNITY): Payer: BC Managed Care – PPO

## 2018-09-12 DIAGNOSIS — R0789 Other chest pain: Secondary | ICD-10-CM | POA: Insufficient documentation

## 2018-09-12 DIAGNOSIS — Z006 Encounter for examination for normal comparison and control in clinical research program: Secondary | ICD-10-CM

## 2018-09-12 DIAGNOSIS — R079 Chest pain, unspecified: Secondary | ICD-10-CM

## 2018-09-12 MED ORDER — NITROGLYCERIN 0.4 MG SL SUBL
SUBLINGUAL_TABLET | SUBLINGUAL | Status: AC
  Filled 2018-09-12: qty 2

## 2018-09-12 MED ORDER — NITROGLYCERIN 0.4 MG SL SUBL
0.8000 mg | SUBLINGUAL_TABLET | Freq: Once | SUBLINGUAL | Status: AC
  Administered 2018-09-12: 0.8 mg via SUBLINGUAL

## 2018-09-12 MED ORDER — IOHEXOL 350 MG/ML SOLN
80.0000 mL | Freq: Once | INTRAVENOUS | Status: AC | PRN
  Administered 2018-09-12: 16:00:00 80 mL via INTRAVENOUS

## 2018-09-12 NOTE — Research (Signed)
Cadfem Informed Consent    Patient Name: Terry Bryant    Subject met inclusion and exclusion criteria.  The informed consent form, study requirements and expectations were reviewed with the subject and questions and concerns were addressed prior to the signing of the consent form.  The subject verbalized understanding of the trail requirements.  The subject agreed to participate in the CADFEM trial and signed the informed consent.  The informed consent was obtained prior to performance of any protocol-specific procedures for the subject.  A copy of the signed informed consent was given to the subject and a copy was placed in the subject's medical record.   [name]

## 2018-09-12 NOTE — Progress Notes (Signed)
Patient stated she felt extreme anxiety while being in CT scanner. Patient states she felt like she couldn't breath and her heart was racing. Patient was sweating. Heart rate did increase significantly.

## 2018-09-15 ENCOUNTER — Telehealth: Payer: Self-pay | Admitting: *Deleted

## 2018-09-15 NOTE — Telephone Encounter (Signed)
Telephone call to patient . Left message to call back regarding Ct results

## 2018-09-15 NOTE — Telephone Encounter (Signed)
-----   Message from Richardo Priest, MD sent at 09/13/2018  9:56 AM EDT ----- Doristine Devoid result   Normal like her echo

## 2018-11-10 NOTE — Progress Notes (Signed)
Cardiology Office Note:    Date:  11/11/2018   ID:  Terry Bryant, DOB [birth year], MRN [id number]  PCP:  Jefm Petty, MD  Cardiologist:  Shirlee More, MD    Referring MD: Jefm Petty, MD    ASSESSMENT:    1. Essential hypertension   2. Iron deficiency anemia due to chronic blood loss   3. Abnormal electrocardiogram (ECG) (EKG)    PLAN:    In order of problems listed above:  1. Stable BP at target continue current treatment diuretic beta-blocker calcium channel blocker.  I stressed compliance sodium restriction ideal weight and activity. 2. Stable continue iron supplements 3. Secondary to hypertension subtle LVH cardiac imaging studies are reassuring and she requires no further imaging or evaluation   Next appointment: As needed   Medication Adjustments/Labs and Tests Ordered: Current medicines are reviewed at length with the patient today.  Concerns regarding medicines are outlined above.  No orders of the defined types were placed in this encounter.  No orders of the defined types were placed in this encounter.   Chief Complaint  Patient presents with  . Follow-up  . Hypertension    History of Present Illness:    Terry Bryant is a 43 y.o. female with a hx of hypertension and iron deficiency anemia last seen 09/02/2018. Compliance with diet, lifestyle and medications: Yes  We had a nice opportunity to review the results of her test listed below echocardiogram is normal except for mild LVH blood pressure is controlled her 14-day event monitor showed no arrhythmia cardiac CTA was not only normal but her calcium score was 0.  She is quite reassured she is improved as her hemoglobin is better and has no chest pain shortness of breath or palpitation.  Echo 09/11/2018: IMPRESSIONS  1. The left ventricle has normal systolic function with an ejection fraction of 60-65%. The cavity size was normal. There is mildly increased left ventricular wall thickness.  Left ventricular diastolic Doppler parameters are consistent with impaired  relaxation.  2. The right ventricle has normal systolic function. The cavity was normal. There is no increase in right ventricular wall thickness.  3. The aorta is normal in size and structure.  4. The inferior vena cava was normal in size with <50% respiratory variability.  Cardiac CTA 09/12/2018: IMPRESSION: 1. Coronary artery calcium score 0 Agatston units. This suggests low risk for future cardiac events. 2.  No significant coronary disease was visualized.  Study Highlights A ZIO monitor was applied 13 days 21 hours beginning 07/21/2018.  The indication is SVT. The rhythm throughout was sinus with minimum average and maximum heart rates of 53, 88 and 178 bpm.  The maximum heart rate was sinus tachycardia. There was no pauses of 3 seconds or greater and no episodes of sinus node or AV block. Ventricular ectopy was rare with isolated PVC Supraventricular ectopy was rare with isolated APCs.  There are no episodes of atrial fibrillation flutter or SVT. There was one diary entry of chest pain with sinus rhythm 74 bpm  Conclusion normal ZIO monitor 14 days   Past Medical History:  Diagnosis Date  . Anemia    TAKES FE OCC  . Blood transfusion 02-16-11   '05 with ovary surgery  . Blood transfusion without reported diagnosis   . Endometriosis of colon 2009  . Fibroids 2004  . Hypertension   . Hypertension 2007   DR [facility]   ON MED  . 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  Pneumonia AGE 69    Past Surgical History:  Procedure Laterality Date  . BREAST CYST EXCISION  02/22/2011   Procedure: CYST EXCISION BREAST;  Surgeon: Haywood Lasso, MD;  Location: [facility];  Service: General;  Laterality: Left;  Removal Left Breast Mass  . CESAREAN SECTION    . CESAREAN SECTION WITH BILATERAL TUBAL LIGATION  01/28/2012   Procedure: CESAREAN SECTION WITH BILATERAL TUBAL LIGATION;  Surgeon: Alwyn Pea, MD;   Location: [facility];  Service: Obstetrics;  Laterality: Bilateral;  Repeat C/S - POSSIBLE BTL  . DILITATION & CURRETTAGE/HYSTROSCOPY WITH HYDROTHERMAL ABLATION N/A 07/17/2018   Procedure: DILATATION & CURETTAGE/HYSTEROSCOPY WITH FAILED HYDROTHERMAL ABLATION;  Surgeon: Delsa Bern, MD;  Location: [place];  Service: Gynecology;  Laterality: N/A;  . HYSTEROSCOPY WITH NOVASURE N/A 07/17/2018   Procedure: HYSTEROSCOPY WITH NOVASURE;  Surgeon: Delsa Bern, MD;  Location: [place];  Service: Gynecology;  Laterality: N/A;  . salpyngooophorectomy     LSO 2005 for TOA    Current Medications: Current Meds  Medication Sig  . aspirin EC 81 MG tablet Take 81 mg by mouth daily as needed.  . fluticasone (FLONASE) 50 MCG/ACT nasal spray Place 1 spray into both nostrils daily as needed.   . gabapentin (NEURONTIN) 100 MG capsule Take 100 mg by mouth daily as needed.   . montelukast (SINGULAIR) 10 MG tablet Take 10 mg by mouth at bedtime.  Marland Kitchen NIFEdipine (PROCARDIA-XL/ADALAT-CC/NIFEDICAL-XL) 30 MG 24 hr tablet Take 30 mg by mouth daily.   . potassium chloride (MICRO-K) 10 MEQ CR capsule Take 1 capsule by mouth daily.  Marland Kitchen SLYND 4 MG TABS TK 1 T PO QD  . triamterene-hydrochlorothiazide (MAXZIDE-25) 37.5-25 MG tablet Take 0.5 tablets by mouth daily.      Allergies:   Bee venom and Penicillins   Social History   Socioeconomic History  . Marital status: Married    Spouse name: CHRISTOPHER  . Number of children: 1  . Years of education: 57  . Highest education level: Not on file  Occupational History  . Occupation: Product manager: [employer]  Social Needs  . Financial resource strain: Not on file  . Food insecurity    Worry: Not on file    Inability: Not on file  . Transportation needs    Medical: Not on file    Non-medical: Not on file  Tobacco Use  . Smoking status: Never Smoker  . Smokeless tobacco: Never Used  Substance and Sexual Activity  .  Alcohol use: No  . Drug use: No  . Sexual activity: Not on file  Lifestyle  . Physical activity    Days per week: Not on file    Minutes per session: Not on file  . Stress: Not on file  Relationships  . Social Herbalist on phone: Not on file    Gets together: Not on file    Attends religious service: Not on file    Active member of club or organization: Not on file    Attends meetings of clubs or organizations: Not on file    Relationship status: Not on file  Other Topics Concern  . Not on file  Social History Narrative  . Not on file     Family History: The patient's family history includes Asthma in her daughter; Diabetes in her father and maternal grandfather; Heart disease in her father, maternal grandfather, and mother; Hypertension in her maternal grandmother; Lupus  in her sister; Stroke in her maternal grandmother. ROS:   Please see the history of present illness.    All other systems reviewed and are negative.  EKGs/Labs/Other Studies Reviewed:    The following studies were reviewed today:    Recent Labs: 07/14/2018: BUN 10; Creatinine, Ser 1.05; Potassium 3.7; Sodium 137 09/02/2018: Hemoglobin 8.9; Platelets 335  Recent Lipid Panel No results found for: CHOL, TRIG, HDL, CHOLHDL, VLDL, LDLCALC, LDLDIRECT  Physical Exam:    VS:  BP 132/88 (BP Location: Right Arm, Patient Position: Sitting, Cuff Size: Normal)   Pulse 76   Temp 98.2 F (36.8 C)   Ht 5\' 6"  (1.676 m)   Wt 175 lb 1.9 oz (79.4 kg)   SpO2 98%   BMI 28.27 kg/m     Wt Readings from Last 3 Encounters:  11/11/18 175 lb 1.9 oz (79.4 kg)  09/02/18 180 lb 6.4 oz (81.8 kg)  07/17/18 180 lb 12.4 oz (82 kg)     GEN:  Well nourished, well developed in no acute distress HEENT: Normal NECK: No JVD; No carotid bruits LYMPHATICS: No lymphadenopathy CARDIAC: RRR, no murmurs, rubs, gallops RESPIRATORY:  Clear to auscultation without rales, wheezing or rhonchi  ABDOMEN: Soft, non-tender,  non-distended MUSCULOSKELETAL:  No edema; No deformity  SKIN: Warm and dry NEUROLOGIC:  Alert and oriented x 3 PSYCHIATRIC:  Normal affect    Signed, Shirlee More, MD  11/11/2018 1:54 PM    [facility]

## 2018-11-11 ENCOUNTER — Other Ambulatory Visit: Payer: Self-pay

## 2018-11-11 ENCOUNTER — Ambulatory Visit (INDEPENDENT_AMBULATORY_CARE_PROVIDER_SITE_OTHER): Payer: BC Managed Care – PPO | Admitting: Cardiology

## 2018-11-11 ENCOUNTER — Encounter: Payer: Self-pay | Admitting: Cardiology

## 2018-11-11 VITALS — BP 132/88 | HR 76 | Temp 98.2°F | Ht 66.0 in | Wt 175.1 lb

## 2018-11-11 DIAGNOSIS — D500 Iron deficiency anemia secondary to blood loss (chronic): Secondary | ICD-10-CM

## 2018-11-11 DIAGNOSIS — R9431 Abnormal electrocardiogram [ECG] [EKG]: Secondary | ICD-10-CM | POA: Diagnosis not present

## 2018-11-11 DIAGNOSIS — I10 Essential (primary) hypertension: Secondary | ICD-10-CM | POA: Diagnosis not present

## 2018-11-11 NOTE — Patient Instructions (Addendum)
Medication Instructions:  Your physician recommends that you continue on your current medications as directed. Please refer to the Current Medication list given to you today.  If you need a refill on your cardiac medications before your next appointment, please call your pharmacy.   Lab work: None  If you have labs (blood work) drawn today and your tests are completely normal, you will receive your results only by: . MyChart Message (if you have MyChart) OR . A paper copy in the mail If you have any lab test that is abnormal or we need to change your treatment, we will call you to review the results.  Testing/Procedures: None   Follow-Up: At [facility], you and your health needs are our priority.  As part of our continuing mission to provide you with exceptional heart care, we have created designated Provider Care Teams.  These Care Teams include your primary Cardiologist (physician) and Advanced Practice Providers (APPs -  Physician Assistants and Nurse Practitioners) who all work together to provide you with the care you need, when you need it. You will need a follow up appointment as needed if symptoms worsen or fail to improve.    

## 2018-12-30 ENCOUNTER — Emergency Department (HOSPITAL_BASED_OUTPATIENT_CLINIC_OR_DEPARTMENT_OTHER)
Admission: EM | Admit: 2018-12-30 | Discharge: 2018-12-30 | Disposition: A | Discharge status: Home / Self Care | Payer: BC Managed Care – PPO | Attending: Emergency Medicine | Admitting: Emergency Medicine

## 2018-12-30 ENCOUNTER — Emergency Department (HOSPITAL_BASED_OUTPATIENT_CLINIC_OR_DEPARTMENT_OTHER): Payer: BC Managed Care – PPO

## 2018-12-30 ENCOUNTER — Encounter (HOSPITAL_BASED_OUTPATIENT_CLINIC_OR_DEPARTMENT_OTHER): Payer: Self-pay | Admitting: Emergency Medicine

## 2018-12-30 ENCOUNTER — Other Ambulatory Visit: Payer: Self-pay

## 2018-12-30 DIAGNOSIS — Z91030 Bee allergy status: Secondary | ICD-10-CM | POA: Insufficient documentation

## 2018-12-30 DIAGNOSIS — M25512 Pain in left shoulder: Secondary | ICD-10-CM | POA: Diagnosis present

## 2018-12-30 DIAGNOSIS — Z7982 Long term (current) use of aspirin: Secondary | ICD-10-CM | POA: Diagnosis not present

## 2018-12-30 DIAGNOSIS — I10 Essential (primary) hypertension: Secondary | ICD-10-CM | POA: Insufficient documentation

## 2018-12-30 DIAGNOSIS — R202 Paresthesia of skin: Secondary | ICD-10-CM | POA: Insufficient documentation

## 2018-12-30 DIAGNOSIS — Z79899 Other long term (current) drug therapy: Secondary | ICD-10-CM | POA: Diagnosis not present

## 2018-12-30 DIAGNOSIS — Z880 Allergy status to penicillin: Secondary | ICD-10-CM | POA: Insufficient documentation

## 2018-12-30 DIAGNOSIS — M62838 Other muscle spasm: Secondary | ICD-10-CM | POA: Insufficient documentation

## 2018-12-30 LAB — CBC WITH DIFFERENTIAL/PLATELET
Abs Immature Granulocytes: 0.01 10*3/uL (ref 0.00–0.07)
Basophils Absolute: 0.1 10*3/uL (ref 0.0–0.1)
Basophils Relative: 1 %
Eosinophils Absolute: 0.3 10*3/uL (ref 0.0–0.5)
Eosinophils Relative: 5 %
HCT: 36.7 % (ref 36.0–46.0)
Hemoglobin: 10.7 g/dL — ABNORMAL LOW (ref 12.0–15.0)
Immature Granulocytes: 0 %
Lymphocytes Relative: 40 %
Lymphs Abs: 2.3 10*3/uL (ref 0.7–4.0)
MCH: 22 pg — ABNORMAL LOW (ref 26.0–34.0)
MCHC: 29.2 g/dL — ABNORMAL LOW (ref 30.0–36.0)
MCV: 75.4 fL — ABNORMAL LOW (ref 80.0–100.0)
Monocytes Absolute: 0.5 10*3/uL (ref 0.1–1.0)
Monocytes Relative: 8 %
Neutro Abs: 2.7 10*3/uL (ref 1.7–7.7)
Neutrophils Relative %: 46 %
Platelets: 323 10*3/uL (ref 150–400)
RBC: 4.87 MIL/uL (ref 3.87–5.11)
RDW: 15.8 % — ABNORMAL HIGH (ref 11.5–15.5)
WBC: 5.9 10*3/uL (ref 4.0–10.5)
nRBC: 0 % (ref 0.0–0.2)

## 2018-12-30 LAB — BASIC METABOLIC PANEL
Anion gap: 8 (ref 5–15)
BUN: 18 mg/dL (ref 6–20)
CO2: 26 mmol/L (ref 22–32)
Calcium: 8.9 mg/dL (ref 8.9–10.3)
Chloride: 100 mmol/L (ref 98–111)
Creatinine, Ser: 0.99 mg/dL (ref 0.44–1.00)
GFR calc Af Amer: >60 mL/min (ref >60)
GFR calc non Af Amer: >60 mL/min (ref >60)
Glucose, Bld: 95 mg/dL (ref 70–99)
Potassium: 3.2 mmol/L — ABNORMAL LOW (ref 3.5–5.1)
Sodium: 134 mmol/L — ABNORMAL LOW (ref 135–145)

## 2018-12-30 LAB — TROPONIN I (HIGH SENSITIVITY): Troponin I (High Sensitivity): <2 ng/L (ref <18)

## 2018-12-30 MED ORDER — METHOCARBAMOL 500 MG PO TABS: 500.0000 mg | ORAL_TABLET | Freq: Two times a day (BID) | ORAL | 0 refills | Status: DC

## 2018-12-30 MED ORDER — ACETAMINOPHEN 500 MG PO TABS: 500.0000 mg | ORAL_TABLET | Freq: Four times a day (QID) | ORAL | 0 refills | Status: DC | PRN

## 2018-12-30 MED ORDER — MELOXICAM 7.5 MG PO TABS: 7.5000 mg | ORAL_TABLET | Freq: Every day | ORAL | 0 refills | Status: DC

## 2018-12-30 NOTE — ED Notes (Signed)
ED Provider at bedside - Dr Madelaine Bhat PA

## 2018-12-30 NOTE — Discharge Instructions (Signed)
Take Mobic once daily for your pain.  Do not combine this with other NSAIDs such as ibuprofen, Aleve, Motrin, Advil, Naprosyn, aspirin.  Take Robaxin twice daily as needed for muscle pain or spasms.  Do not drive or operate machinery while taking this medication.  Use ice and heat alternating 20 minutes on, 20 minutes off.  Attempt the stretches we discussed.  Try to focus on good posture when you are at the computer.  Please follow-up with your doctor or the sports medicine doctor below if your symptoms are not improving.  Please return to the emergency department if you develop any new or worsening symptoms.

## 2018-12-30 NOTE — ED Triage Notes (Signed)
Pain in left scapula, shoulder and upper arm since yesterday.  Worse with movement.  Some tingling in fingers of left hand.  No known injury.

## 2018-12-30 NOTE — ED Provider Notes (Signed)
[facility] EMERGENCY DEPARTMENT Provider Note   CSN: [id number] Arrival date & time: 12/30/18  1753     History   Chief Complaint Chief Complaint  Patient presents with  . Shoulder Pain    HPI Terry Bryant is a 43 y.o. female with history of hypertension, palpitations who presents with left shoulder pain that wraps around her chest.  It hurts with movement, but is not necessarily much worse with movement.  She denies any significant chest pain or shortness of breath.  She has had some tingling in her left hand.  She reports she has been on the computer more due to online schooling, as she is a Pharmacist, hospital.  She took some ibuprofen at home without relief.  She denies any abdominal pain, nausea, vomiting.     HPI  Past Medical History:  Diagnosis Date  . Anemia    TAKES FE OCC  . Blood transfusion 02-16-11   '05 with ovary surgery  . Blood transfusion without reported diagnosis   . Endometriosis of colon 2009  . Fibroids 2004  . Hypertension   . Hypertension 2007   [facility]   [facility]  . 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 Pneumonia AGE 71    Patient Active Problem List   Diagnosis Date Noted  . Preoperative cardiovascular examination 07/15/2018  . Abnormal electrocardiogram (ECG) (EKG) 07/15/2018  . Palpitations 07/15/2018  . Status post repeat low transverse cesarean section 01/29/2012  . Essential hypertension 09/05/2011  . Anemia 08/06/2011  . H/O cesarean section 07/17/2011  . Endometriosis of colon 06/06/2011  . Pelvic adhesions 06/06/2011    Past Surgical History:  Procedure Laterality Date  . BREAST CYST EXCISION  02/22/2011   Procedure: CYST EXCISION BREAST;  Surgeon: Haywood Lasso, MD;  Location: [facility];  Service: General;  Laterality: Left;  Removal Left Breast Mass  . CESAREAN SECTION    . CESAREAN SECTION WITH BILATERAL TUBAL LIGATION  01/28/2012   Procedure: CESAREAN SECTION WITH BILATERAL TUBAL LIGATION;  Surgeon: Alwyn Pea, MD;  Location: [facility];  Service: Obstetrics;  Laterality: Bilateral;  Repeat C/S - POSSIBLE BTL  . DILITATION & CURRETTAGE/HYSTROSCOPY WITH HYDROTHERMAL ABLATION N/A 07/17/2018   Procedure: DILATATION & CURETTAGE/HYSTEROSCOPY WITH FAILED HYDROTHERMAL ABLATION;  Surgeon: Delsa Bern, MD;  Location: [place];  Service: Gynecology;  Laterality: N/A;  . HYSTEROSCOPY WITH NOVASURE N/A 07/17/2018   Procedure: HYSTEROSCOPY WITH NOVASURE;  Surgeon: Delsa Bern, MD;  Location: [facility];  Service: Gynecology;  Laterality: N/A;  . salpyngooophorectomy     LSO 2005 for TOA     OB History    Gravida  2   Para  2   Term  2   Preterm      AB      Living  2     SAB      TAB      Ectopic      Multiple      Live Births  2            Home Medications    Prior to Admission medications   Medication Sig Start Date End Date Taking? Authorizing Provider  aspirin EC 81 MG tablet Take 81 mg by mouth daily as needed.   Yes [provider]  fluticasone (FLONASE) 50 MCG/ACT nasal spray Place 1 spray into both nostrils daily as needed.    Yes Joline Salt, RN  gabapentin (NEURONTIN) 100 MG capsule  Take 100 mg by mouth daily as needed.    Yes [provider]  montelukast (SINGULAIR) 10 MG tablet Take 10 mg by mouth at bedtime.   Yes Joline Salt, RN  NIFEdipine (PROCARDIA-XL/ADALAT-CC/NIFEDICAL-XL) 30 MG 24 hr tablet Take 30 mg by mouth daily.    Yes [provider]  potassium chloride (MICRO-K) 10 MEQ CR capsule Take 1 capsule by mouth daily. 09/04/18  Yes [provider]  SLYND 4 MG TABS TK 1 T PO QD 08/12/18  Yes [provider]  triamterene-hydrochlorothiazide (MAXZIDE-25) 37.5-25 MG tablet Take 0.5 tablets by mouth daily.    Yes Joline Salt, RN  acetaminophen (TYLENOL) 500 MG tablet Take 1 tablet (500 mg total) by mouth every 6 (six) hours as needed. 12/30/18   Ileah Falkenstein, Bea Graff, PA-C  meloxicam  (MOBIC) 7.5 MG tablet Take 1 tablet (7.5 mg total) by mouth daily. 12/30/18   Breaker Springer, Bea Graff, PA-C  methocarbamol (ROBAXIN) 500 MG tablet Take 1 tablet (500 mg total) by mouth 2 (two) times daily. 12/30/18   Frederica Kuster, PA-C    Family History Family History  Problem Relation Age of Onset  . Heart disease Mother   . Diabetes Father   . Heart disease Father        DIED AT 25  . Heart disease Maternal Grandfather   . Diabetes Maternal Grandfather   . Lupus Sister   . Hypertension Maternal Grandmother   . Stroke Maternal Grandmother   . Asthma Daughter     Social History Social History   Tobacco Use  . Smoking status: Never Smoker  . Smokeless tobacco: Never Used  Substance Use Topics  . Alcohol use: No  . Drug use: No     Allergies   Bee venom and Penicillins   Review of Systems Review of Systems  Constitutional: Negative for chills and fever.  HENT: Negative for facial swelling and sore throat.   Respiratory: Negative for shortness of breath.   Cardiovascular: Negative for chest pain.  Gastrointestinal: Negative for abdominal pain, nausea and vomiting.  Genitourinary: Negative for dysuria.  Musculoskeletal: Positive for arthralgias, back pain and myalgias.  Skin: Negative for rash and wound.  Neurological: Positive for numbness (paresthesia in L hand). Negative for headaches.  Psychiatric/Behavioral: The patient is not nervous/anxious.      Physical Exam Updated Vital Signs BP (!) 138/92 (BP Location: Left Arm)   Pulse 90   Temp 99.3 F (37.4 C) (Oral)   Resp 16   Ht 5\' 5"  (1.651 m)   Wt 83.3 kg   LMP 11/10/2018   SpO2 100%   BMI 30.55 kg/m   Physical Exam Vitals signs and nursing note reviewed.  Constitutional:      General: She is not in acute distress.    Appearance: She is well-developed. She is not diaphoretic.  HENT:     Head: Normocephalic and atraumatic.     Mouth/Throat:     Pharynx: No oropharyngeal exudate.  Eyes:     General:  No scleral icterus.       Right eye: No discharge.        Left eye: No discharge.     Conjunctiva/sclera: Conjunctivae normal.     Pupils: Pupils are equal, round, and reactive to light.  Neck:     Musculoskeletal: Normal range of motion and neck supple.     Thyroid: No thyromegaly.  Cardiovascular:     Rate and Rhythm: Normal rate and regular rhythm.  Heart sounds: Normal heart sounds. No murmur. No friction rub. No gallop.   Pulmonary:     Effort: Pulmonary effort is normal. No respiratory distress.     Breath sounds: Normal breath sounds. No stridor. No wheezing or rales.  Chest:     Chest wall: Tenderness present.    Abdominal:     General: Bowel sounds are normal. There is no distension.     Palpations: Abdomen is soft.     Tenderness: There is no abdominal tenderness. There is no guarding or rebound.  Musculoskeletal:     Cervical back: She exhibits spasm. She exhibits no tenderness and no bony tenderness.       Back:  Lymphadenopathy:     Cervical: No cervical adenopathy.  Skin:    General: Skin is warm and dry.     Coloration: Skin is not pale.     Findings: No rash.  Neurological:     Mental Status: She is alert.     Coordination: Coordination normal.     Comments: CN 3-12 intact; normal sensation throughout; 5/5 strength in all 4 extremities; equal bilateral grip strength      ED Treatments / Results  Labs (all labs ordered are listed, but only abnormal results are displayed) Labs Reviewed  BASIC METABOLIC PANEL - Abnormal; Notable for the following components:      Result Value   Sodium 134 (*)    Potassium 3.2 (*)    All other components within normal limits  CBC WITH DIFFERENTIAL/PLATELET - Abnormal; Notable for the following components:   Hemoglobin 10.7 (*)    MCV 75.4 (*)    MCH 22.0 (*)    MCHC 29.2 (*)    RDW 15.8 (*)    All other components within normal limits  TROPONIN I (HIGH SENSITIVITY)    EKG None  Radiology Dg Chest 2 View   Result Date: 12/30/2018 CLINICAL DATA:  Chest and left arm pain EXAM: CHEST - 2 VIEW COMPARISON:  12/11/2015 FINDINGS: The heart size and mediastinal contours are within normal limits. Both lungs are clear. The visualized skeletal structures are unremarkable. IMPRESSION: No active cardiopulmonary disease. Electronically Signed   By: Donavan Foil M.D.   On: 12/30/2018 18:47    Procedures Procedures (including critical care time)  Medications Ordered in ED Medications - No data to display   Initial Impression / Assessment and Plan / ED Course  I have reviewed the triage vital signs and the nursing notes.  Pertinent labs & imaging results that were available during my care of the patient were reviewed by me and considered in my medical decision making (see chart for details).  Clinical Course as of Dec 30 1934  Tue Dec 30, 2018  1906 EKG showing normal sinus rhythm rate of 84 no acute ST-T's, possible LVH.   [MB]  [id number] 43 year old female complaining of some left scapular pain that is been going on since yesterday.  No apparent trauma.  Benign exam other than some reproducible tenderness.  Lab work chest x-ray unrevealing for serious cause of this.  Will discharge with symptomatic treatment and clear return instructions.   [MB]    Clinical Course User Index [MB] Hayden Rasmussen, MD       Patient presenting with left shoulder pain wrapping to the chest.  The pain is reproducible on palpation, however considering patient's history of palpitations, labs and troponin obtained which was within normal limits.  Chest x-ray is clear.  EKG shows no acute  ST or T wave abnormalities.  Will discharge home with supportive treatment including ice, heat, stretching, Mobic instead of ibuprofen, Tylenol, and Robaxin.  Patient advised to try to focus on posture.  Follow-up to PCP or sports medicine if symptoms are not improving.  Return precautions discussed.  Patient understands and agrees with plan.   Patient vitals stable throughout ED course and discharged in satisfactory condition.  Final Clinical Impressions(s) / ED Diagnoses   Final diagnoses:  Trapezius muscle spasm    ED Discharge Orders         Ordered    meloxicam (MOBIC) 7.5 MG tablet  Daily     12/30/18 1935    acetaminophen (TYLENOL) 500 MG tablet  Every 6 hours PRN     12/30/18 1935    methocarbamol (ROBAXIN) 500 MG tablet  2 times daily     12/30/18 1935           Pieter Fooks M, [place] 12/30/18 1941    Hayden Rasmussen, MD 12/31/18 [id number]

## 2019-01-31 DIAGNOSIS — M79622 Pain in left upper arm: Secondary | ICD-10-CM | POA: Insufficient documentation

## 2019-01-31 HISTORY — DX: Pain in left upper arm: M79.622

## 2019-10-25 NOTE — Progress Notes (Signed)
Cardiology Office Note:    Date:  10/26/2019   ID:  Terry Bryant, DOB 07/29/1975, MRN [id number]  PCP:  Jefm Petty, MD  Cardiologist:  Shirlee More, MD    Referring MD: Jefm Petty, MD    ASSESSMENT:    1. Chest pain of uncertain etiology   2. Essential hypertension   3. Iron deficiency anemia due to chronic blood loss   4. Palpitations    PLAN:    In order of problems listed above:  1. Further evaluation duplex of gallbladder stop aspirin start a PPI.  If continued symptoms unimproved may benefit from esophageal evaluation 2. Stable BP at target continue her mixed proximal and distal diuretic and calcium channel blocker 3. Improved continue iron supplement 4. Ongoing encouraged to purchase the iPhone adapter to record episodes and send them via MyChart   Next appointment: 6 weeks   Medication Adjustments/Labs and Tests Ordered: Current medicines are reviewed at length with the patient today.  Concerns regarding medicines are outlined above.  Orders Placed This Encounter  Procedures  . US ABDOMEN LIMITED RUQ   Meds ordered this encounter  Medications  . omeprazole (PRILOSEC) 20 MG capsule    Sig: Take 1 capsule (20 mg total) by mouth daily.    Dispense:  90 capsule    Refill:  3    Chief Complaint  Patient presents with  . Follow-up  . Hypertension    History of Present Illness:    Terry Bryant is a 44 y.o. female with a hx of hypertension and iron deficiency anemia.  She was last seen 11/11/2018.  She had evaluation performed in July 2020 showing mild concentric LVH a coronary artery calcium score of 0 and normal cardiac CTA.  She also wore a ZIO monitor with no significant arrhythmia. Compliance with diet, lifestyle and medications: Yes  She is here today because in November she had an ED visit with chest pain.  It occurs and intermittently its not exertional variable locations is both pressure and sharp unrelated to meals tends to happen at  night.  She does have symptoms of dyspepsia she takes aspirin she will stop taking aspirin I will put her on a PPI check duplex of her gallbladder looking for noncardiac etiologies of chest pain.  She also has had episodes she describes as panic where she wakes up at night with her heart racing I encouraged her to purchase the over-the-counter iPhone adapter to record rhythm strips as her event monitor is unremarkable.  Her blood pressure is now at target hypokalemia resolved and hemoglobin is improved and is taking iron supplement. Past Medical History:  Diagnosis Date  . Anemia    TAKES FE OCC  . Blood transfusion 02-16-11   '05 with ovary surgery  . Blood transfusion without reported diagnosis   . Endometriosis of colon 2009  . Fibroids 2004  . Hypertension   . Hypertension 2007   DR [facility]   ON MED  . 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 Pneumonia AGE 57    Past Surgical History:  Procedure Laterality Date  . BREAST CYST EXCISION  02/22/2011   Procedure: CYST EXCISION BREAST;  Surgeon: Haywood Lasso, MD;  Location: [facility];  Service: General;  Laterality: Left;  Removal Left Breast Mass  . CESAREAN SECTION    . CESAREAN SECTION WITH BILATERAL TUBAL LIGATION  01/28/2012   Procedure: CESAREAN SECTION WITH BILATERAL TUBAL LIGATION;  Surgeon: Alwyn Pea, MD;  [facility]  ORS;  Service: Obstetrics;  Laterality: Bilateral;  Repeat C/S - POSSIBLE BTL  . DILITATION & CURRETTAGE/HYSTROSCOPY WITH HYDROTHERMAL ABLATION N/A 07/17/2018   Procedure: DILATATION & CURETTAGE/HYSTEROSCOPY WITH FAILED HYDROTHERMAL ABLATION;  Surgeon: Delsa Bern, MD;  Location: [place];  Service: Gynecology;  Laterality: N/A;  . HYSTEROSCOPY WITH NOVASURE N/A 07/17/2018   Procedure: HYSTEROSCOPY WITH NOVASURE;  Surgeon: Delsa Bern, MD;  Location: [place];  Service: Gynecology;  Laterality: N/A;  . salpyngooophorectomy     LSO 2005 for TOA    Current  Medications: Current Meds  Medication Sig  . acetaminophen (TYLENOL) 500 MG tablet Take 1 tablet (500 mg total) by mouth every 6 (six) hours as needed.  Marland Kitchen EPINEPHrine 0.3 mg/0.3 mL IJ SOAJ injection Inject 0.3 mLs into the muscle as needed.  . fluticasone (FLONASE) 50 MCG/ACT nasal spray Place 1 spray into both nostrils daily as needed.   . gabapentin (NEURONTIN) 100 MG capsule Take 100 mg by mouth daily as needed.   . meloxicam (MOBIC) 7.5 MG tablet Take 1 tablet (7.5 mg total) by mouth daily.  . montelukast (SINGULAIR) 10 MG tablet Take 10 mg by mouth at bedtime.  Marland Kitchen NIFEdipine (PROCARDIA-XL/ADALAT-CC/NIFEDICAL-XL) 30 MG 24 hr tablet Take 30 mg by mouth daily.   Marland Kitchen SLYND 4 MG TABS TK 1 T PO QD  . triamterene-hydrochlorothiazide (MAXZIDE-25) 37.5-25 MG tablet Take 0.5 tablets by mouth daily.   . [DISCONTINUED] aspirin EC 81 MG tablet Take 81 mg by mouth daily as needed.     Allergies:   Bee venom and Penicillins   Social History   Socioeconomic History  . Marital status: Married    Spouse name: CHRISTOPHER  . Number of children: 1  . Years of education: 50  . Highest education level: Not on file  Occupational History  . Occupation: Product manager: [employer]  Tobacco Use  . Smoking status: Never Smoker  . Smokeless tobacco: Never Used  Vaping Use  . Vaping Use: Never used  Substance and Sexual Activity  . Alcohol use: No  . Drug use: No  . Sexual activity: Not on file  Other Topics Concern  . Not on file  Social History Narrative  . Not on file   Social Determinants of Health   Financial Resource Strain:   . Difficulty of Paying Living Expenses: Not on file  Food Insecurity:   . Worried About Charity fundraiser in the Last Year: Not on file  . Ran Out of Food in the Last Year: Not on file  Transportation Needs:   . Lack of Transportation (Medical): Not on file  . Lack of Transportation (Non-Medical): Not on file  Physical Activity:   . Days of  Exercise per Week: Not on file  . Minutes of Exercise per Session: Not on file  Stress:   . Feeling of Stress : Not on file  Social Connections:   . Frequency of Communication with Friends and Family: Not on file  . Frequency of Social Gatherings with Friends and Family: Not on file  . Attends Religious Services: Not on file  . Active Member of Clubs or Organizations: Not on file  . Attends Archivist Meetings: Not on file  . Marital Status: Not on file     Family History: The patient's family history includes Asthma in her daughter; Diabetes in her father and maternal grandfather; Heart disease in her father, maternal grandfather, and mother; Hypertension in  her maternal grandmother; Lupus in her sister; Stroke in her maternal grandmother. ROS:   Please see the history of present illness.    All other systems reviewed and are negative.  EKGs/Labs/Other Studies Reviewed:    The following studies were reviewed today:  EKG: Performed in her ED visit in November independently reviewed sinus rhythm and normal  Recent Labs:  12/20/2018, hemoglobin was improved 10.7 MCV 75 08/22/2019: Potassium 3.9 creatinine 1.06 GFR greater than 75 cc LDL 150 cholesterol 210 HDL 37 triglycerides 91 Below 12/30/2018: BUN 18; Creatinine, Ser 0.99; Hemoglobin 10.7; Platelets 323; Potassium 3.2; Sodium 134  Recent Lipid Panel No results found for: CHOL, TRIG, HDL, CHOLHDL, VLDL, LDLCALC, LDLDIRECT  Physical Exam:    VS:  BP 119/81   Pulse 78   Ht 5\' 6"  (1.676 m)   Wt 189 lb 0.6 oz (85.7 kg)   SpO2 98%   BMI 30.51 kg/m     Wt Readings from Last 3 Encounters:  10/26/19 189 lb 0.6 oz (85.7 kg)  12/30/18 183 lb 9.6 oz (83.3 kg)  11/11/18 175 lb 1.9 oz (79.4 kg)     GEN:  Well nourished, well developed in no acute distress HEENT: Normal NECK: No JVD; No carotid bruits LYMPHATICS: No lymphadenopathy CARDIAC: RRR, no murmurs, rubs, gallops RESPIRATORY:  Clear to auscultation without  rales, wheezing or rhonchi  ABDOMEN: Soft, non-tender, non-distended MUSCULOSKELETAL:  No edema; No deformity  SKIN: Warm and dry NEUROLOGIC:  Alert and oriented x 3 PSYCHIATRIC:  Normal affect    Signed, Shirlee More, MD  10/26/2019 12:09 PM    [place]

## 2019-10-26 ENCOUNTER — Ambulatory Visit: Payer: BC Managed Care – PPO | Admitting: Cardiology

## 2019-10-26 ENCOUNTER — Encounter: Payer: Self-pay | Admitting: Cardiology

## 2019-10-26 ENCOUNTER — Other Ambulatory Visit: Payer: Self-pay

## 2019-10-26 VITALS — BP 119/81 | HR 78 | Ht 66.0 in | Wt 189.0 lb

## 2019-10-26 DIAGNOSIS — N852 Hypertrophy of uterus: Secondary | ICD-10-CM

## 2019-10-26 DIAGNOSIS — R002 Palpitations: Secondary | ICD-10-CM | POA: Diagnosis not present

## 2019-10-26 DIAGNOSIS — D500 Iron deficiency anemia secondary to blood loss (chronic): Secondary | ICD-10-CM

## 2019-10-26 DIAGNOSIS — I10 Essential (primary) hypertension: Secondary | ICD-10-CM | POA: Diagnosis not present

## 2019-10-26 DIAGNOSIS — R079 Chest pain, unspecified: Secondary | ICD-10-CM

## 2019-10-26 HISTORY — DX: Hypertrophy of uterus: N85.2

## 2019-10-26 MED ORDER — OMEPRAZOLE 20 MG PO CPDR: 20.0000 mg | DELAYED_RELEASE_CAPSULE | Freq: Every day | ORAL | 3 refills | Status: DC

## 2019-10-26 NOTE — Patient Instructions (Signed)
Medication Instructions:  Your physician has recommended you make the following change in your medication:  STOP: Aspirin START: Prilosec 20 mg take one tablet by mouth daily at bedtime *If you need a refill on your cardiac medications before your next appointment, please call your pharmacy*   Lab Work: None If you have labs (blood work) drawn today and your tests are completely normal, you will receive your results only by: Marland Kitchen MyChart Message (if you have MyChart) OR . A paper copy in the mail If you have any lab test that is abnormal or we need to change your treatment, we will call you to review the results.   Testing/Procedures: We have put in an order for you to have an ultrasound completed of your gallbladder.    Follow-Up: At [facility], you and your health needs are our priority.  As part of our continuing mission to provide you with exceptional heart care, we have created designated Provider Care Teams.  These Care Teams include your primary Cardiologist (physician) and Advanced Practice Providers (APPs -  Physician Assistants and Nurse Practitioners) who all work together to provide you with the care you need, when you need it.  We recommend signing up for the patient portal called "MyChart".  Sign up information is provided on this After Visit Summary.  MyChart is used to connect with patients for Virtual Visits (Telemedicine).  Patients are able to view lab/test results, encounter notes, upcoming appointments, etc.  Non-urgent messages can be sent to your provider as well.   To learn more about what you can do with MyChart, go to [email or web address].    Your next appointment:   6 week(s)  The format for your next appointment:   In Person  Provider:   Shirlee More, MD   Other Instructions

## 2019-11-04 ENCOUNTER — Other Ambulatory Visit: Payer: Self-pay | Admitting: Obstetrics and Gynecology

## 2019-11-09 ENCOUNTER — Ambulatory Visit: Payer: BC Managed Care – PPO | Admitting: Cardiology

## 2019-12-11 ENCOUNTER — Other Ambulatory Visit: Payer: Self-pay

## 2019-12-11 ENCOUNTER — Ambulatory Visit (HOSPITAL_BASED_OUTPATIENT_CLINIC_OR_DEPARTMENT_OTHER)
Admission: RE | Admit: 2019-12-11 | Discharge: 2019-12-11 | Disposition: A | Discharge status: Home / Self Care | Payer: BC Managed Care – PPO | Source: Ambulatory Visit | Attending: Cardiology | Admitting: Cardiology

## 2019-12-11 DIAGNOSIS — R079 Chest pain, unspecified: Secondary | ICD-10-CM | POA: Diagnosis present

## 2019-12-14 ENCOUNTER — Telehealth: Payer: Self-pay

## 2019-12-14 ENCOUNTER — Encounter: Payer: Self-pay | Admitting: Cardiology

## 2019-12-14 DIAGNOSIS — K824 Cholesterolosis of gallbladder: Secondary | ICD-10-CM

## 2019-12-14 NOTE — Telephone Encounter (Signed)
Spoke with patient regarding results and recommendation.  Patient verbalizes understanding and is agreeable to plan of care. Advised patient to call back with any issues or concerns.  

## 2019-12-21 DIAGNOSIS — J189 Pneumonia, unspecified organism: Secondary | ICD-10-CM | POA: Insufficient documentation

## 2019-12-21 DIAGNOSIS — Z5189 Encounter for other specified aftercare: Secondary | ICD-10-CM | POA: Insufficient documentation

## 2019-12-21 DIAGNOSIS — I10 Essential (primary) hypertension: Secondary | ICD-10-CM | POA: Insufficient documentation

## 2019-12-22 NOTE — Progress Notes (Signed)
Cardiology Office Note:    Date:  12/23/2019   ID:  Terry Bryant, DOB June 03, 1975, MRN [id number]  PCP:  Jefm Petty, MD  Cardiologist:  Shirlee More, MD    Referring MD: Jefm Petty, MD    ASSESSMENT:    1. Chest pain of uncertain etiology   2. Gallbladder polyp   3. Essential hypertension   4. Abnormal electrocardiogram (ECG) (EKG)    PLAN:    In order of problems listed above:  1. Stable no evidence of CAD and I agree with the recommendation at this time not to remove her gallbladder she is reassured 2. Well controlled we will hold her antihypertensive agents the day of surgery will need them postoperatively 3. Stable EKG in my opinion normal for age and not diagnostic of LVH 4. From a cardiology perspective she is optimized for planned GYN surgery hysterectomy I will send a note to her surgeon   Next appointment: As needed   Medication Adjustments/Labs and Tests Ordered: Current medicines are reviewed at length with the patient today.  Concerns regarding medicines are outlined above.  Orders Placed This Encounter  Procedures  . EKG 12-Lead   No orders of the defined types were placed in this encounter.   No chief complaint on file.   History of Present Illness:    Terry Bryant is a 44 y.o. female with a hx of hypertension iron deficiency anemia chest pain with normal cardiac CTA and calcium score of 0 with ongoing right upper quadrant pain and indigestion last seen 10/26/2019.  After last visit she is found to have a 7 mm gallbladder polyp and was referred gastroenterology [facility] who recommended a conservative approach and follow-up duplex.  She is now scheduled for laparoscopic cholecystectomy 01/12/2020.  Compliance with diet, lifestyle and medications: Yes  She is seen for several reasons today the first is preoperatively she is doing well from a cardiology perspective her EKG in my opinion is normal for age. Second is blood  pressure well controlled we will hold her antihypertensive agents preoperatively The third is she wants my opinion about her gallbladder polyp I think she had good recommendations will follow up with GI and I would not advise surgical intervention. She had a cardiac CTA reported out 09/12/2018 with a calcium score of 0 and no CAD and does not have angina shortness of breath palpitation or syncope.  Past Medical History:  Diagnosis Date  . Abnormal electromyogram 04/15/2015  . Allergic rhinitis 04/15/2015  . Anemia    TAKES FE OCC  . Atopic dermatitis 04/15/2015  . Blood transfusion 02-16-11   '05 with ovary surgery  . Blood transfusion without reported diagnosis   . Carpal tunnel syndrome 04/15/2015  . Cervical radiculopathy at C6 04/15/2015  . Constipation 04/15/2015  . Endometriosis 04/11/2017  . Endometriosis of colon 2009  . Enlarged uterus 10/26/2019  . Fibroids 2004  . GERD (gastroesophageal reflux disease) 04/15/2015  . H/O cesarean section 07/17/2011   Pt had C/S then developed a left pelvic abscess and had ex lap with LSO. Undecided   . History of iron deficiency anemia 06/05/2016  . Hypertension   . Hypertension 2007   DR [facility]   ON MED  . Hypertensive disorder 09/05/2011   Dr Jacklynn Lewis, on procardia 30mg  XL  . Hypokalemia 09/19/2017  . 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 Metrorrhagia 04/11/2017  . Numbness and tingling in left arm 04/15/2015  . Ocular migraine 08/26/2018  . Pain  in left upper arm 01/31/2019  . Palpitations 07/15/2018  . Pelvic adhesions 06/06/2011  . Pneumonia AGE 87  . Polymenorrhea 04/11/2017  . Preoperative cardiovascular examination 07/15/2018  . Status post repeat low transverse cesarean section 01/29/2012  . Umbilical hernia without obstruction and without gangrene 04/15/2015  . Uterine leiomyoma 09/05/2011    Past Surgical History:  Procedure Laterality Date  . BREAST CYST EXCISION  02/22/2011   Procedure: CYST EXCISION BREAST;  Surgeon: Haywood Lasso, MD;   Location: [facility];  Service: General;  Laterality: Left;  Removal Left Breast Mass  . CESAREAN SECTION    . CESAREAN SECTION WITH BILATERAL TUBAL LIGATION  01/28/2012   Procedure: CESAREAN SECTION WITH BILATERAL TUBAL LIGATION;  Surgeon: Alwyn Pea, MD;  Location: [place] [facility];  Service: Obstetrics;  Laterality: Bilateral;  Repeat C/S - POSSIBLE BTL  . DILITATION & CURRETTAGE/HYSTROSCOPY WITH HYDROTHERMAL ABLATION N/A 07/17/2018   Procedure: DILATATION & CURETTAGE/HYSTEROSCOPY WITH FAILED HYDROTHERMAL ABLATION;  Surgeon: Delsa Bern, MD;  Location: [place];  Service: Gynecology;  Laterality: N/A;  . HYSTEROSCOPY WITH NOVASURE N/A 07/17/2018   Procedure: HYSTEROSCOPY WITH NOVASURE;  Surgeon: Delsa Bern, MD;  Location: [place];  Service: Gynecology;  Laterality: N/A;  . salpyngooophorectomy     LSO 2005 for TOA    Current Medications: Current Meds  Medication Sig  . acetaminophen (TYLENOL) 500 MG tablet Take 1 tablet (500 mg total) by mouth every 6 (six) hours as needed.  Marland Kitchen EPINEPHrine 0.3 mg/0.3 mL IJ SOAJ injection Inject 0.3 mLs into the muscle as needed.  . fluticasone (FLONASE) 50 MCG/ACT nasal spray Place 1 spray into both nostrils daily as needed.   . gabapentin (NEURONTIN) 100 MG capsule Take 100 mg by mouth daily as needed.   . montelukast (SINGULAIR) 10 MG tablet Take 10 mg by mouth at bedtime.  Marland Kitchen NIFEdipine (PROCARDIA-XL/ADALAT-CC/NIFEDICAL-XL) 30 MG 24 hr tablet Take 30 mg by mouth daily.   Marland Kitchen omeprazole (PRILOSEC) 20 MG capsule Take 1 capsule (20 mg total) by mouth daily.  Marland Kitchen SLYND 4 MG TABS TK 1 T PO QD  . triamterene-hydrochlorothiazide (MAXZIDE-25) 37.5-25 MG tablet Take 0.5 tablets by mouth daily.      Allergies:   Bee venom and Penicillins   Social History   Socioeconomic History  . Marital status: Married    Spouse name: CHRISTOPHER  . Number of children: 1  . Years of education: 67  . Highest education level: Not on file    Occupational History  . Occupation: Product manager: [employer]  Tobacco Use  . Smoking status: Never Smoker  . Smokeless tobacco: Never Used  Vaping Use  . Vaping Use: Never used  Substance and Sexual Activity  . Alcohol use: No  . Drug use: No  . Sexual activity: Not on file  Other Topics Concern  . Not on file  Social History Narrative  . Not on file   Social Determinants of Health   Financial Resource Strain:   . Difficulty of Paying Living Expenses: Not on file  Food Insecurity:   . Worried About Charity fundraiser in the Last Year: Not on file  . Ran Out of Food in the Last Year: Not on file  Transportation Needs:   . Lack of Transportation (Medical): Not on file  . Lack of Transportation (Non-Medical): Not on file  Physical Activity:   . Days of Exercise per Week: Not on file  . Minutes  of Exercise per Session: Not on file  Stress:   . Feeling of Stress : Not on file  Social Connections:   . Frequency of Communication with Friends and Family: Not on file  . Frequency of Social Gatherings with Friends and Family: Not on file  . Attends Religious Services: Not on file  . Active Member of Clubs or Organizations: Not on file  . Attends Archivist Meetings: Not on file  . Marital Status: Not on file     Family History: The patient's family history includes Asthma in her daughter; Diabetes in her father and maternal grandfather; Heart disease in her father, maternal grandfather, and mother; Hypertension in her maternal grandmother; Lupus in her sister; Stroke in her maternal grandmother. ROS:   Please see the history of present illness.    All other systems reviewed and are negative.  EKGs/Labs/Other Studies Reviewed:    The following studies were reviewed today:  EKG:  EKG ordered today and personally reviewed.  The ekg ordered today demonstrates sinus rhythm tall voltage not diagnostic of LVH in my opinion normal for age  Recent  Labs: 12/30/2018: BUN 18; Creatinine, Ser 0.99; Hemoglobin 10.7; Platelets 323; Potassium 3.2; Sodium 134  Recent Lipid Panel No results found for: CHOL, TRIG, HDL, CHOLHDL, VLDL, LDLCALC, LDLDIRECT  Physical Exam:    VS:  BP 108/70   Pulse 69   Ht 5\' 6"  (1.676 m)   Wt 192 lb 1.3 oz (87.1 kg)   SpO2 98%   BMI 31.00 kg/m     Wt Readings from Last 3 Encounters:  12/23/19 192 lb 1.3 oz (87.1 kg)  10/26/19 189 lb 0.6 oz (85.7 kg)  12/30/18 183 lb 9.6 oz (83.3 kg)     GEN:  Well nourished, well developed in no acute distress HEENT: Normal NECK: No JVD; No carotid bruits LYMPHATICS: No lymphadenopathy CARDIAC: RRR, no murmurs, rubs, gallops RESPIRATORY:  Clear to auscultation without rales, wheezing or rhonchi  ABDOMEN: Soft, non-tender, non-distended MUSCULOSKELETAL:  No edema; No deformity  SKIN: Warm and dry NEUROLOGIC:  Alert and oriented x 3 PSYCHIATRIC:  Normal affect    Signed, Shirlee More, MD  12/23/2019 8:43 AM    [place]

## 2019-12-23 ENCOUNTER — Ambulatory Visit (INDEPENDENT_AMBULATORY_CARE_PROVIDER_SITE_OTHER): Payer: BC Managed Care – PPO | Admitting: Cardiology

## 2019-12-23 ENCOUNTER — Encounter: Payer: Self-pay | Admitting: Cardiology

## 2019-12-23 ENCOUNTER — Other Ambulatory Visit: Payer: Self-pay

## 2019-12-23 VITALS — BP 108/70 | HR 69 | Ht 66.0 in | Wt 192.1 lb

## 2019-12-23 DIAGNOSIS — R9431 Abnormal electrocardiogram [ECG] [EKG]: Secondary | ICD-10-CM | POA: Diagnosis not present

## 2019-12-23 DIAGNOSIS — I10 Essential (primary) hypertension: Secondary | ICD-10-CM

## 2019-12-23 DIAGNOSIS — R079 Chest pain, unspecified: Secondary | ICD-10-CM

## 2019-12-23 DIAGNOSIS — K824 Cholesterolosis of gallbladder: Secondary | ICD-10-CM

## 2019-12-23 NOTE — Patient Instructions (Signed)

## 2019-12-30 DIAGNOSIS — G8929 Other chronic pain: Secondary | ICD-10-CM | POA: Diagnosis present

## 2019-12-30 DIAGNOSIS — R102 Pelvic and perineal pain: Secondary | ICD-10-CM | POA: Diagnosis present

## 2019-12-30 NOTE — H&P (Signed)
Terry Bryant is a 44 y.o. female   P: 2-0-0-2 who presents for a hysterectomy because of chronic pelvic pain, menorrhagia and uterine fibroids.  For many years  the patient has had problems with her menstrual flow.  Her flow would last would last for 5 days with clots and require a  pad change every 1.5 hours.  Additionally her cramping would be rated at 10/10 but relieved with  prescription strength NSAIDs.  Previous management for this flow included the Mirena IUD however, on #2 occasions the device was  expelled due to her heavy flow.   She underwent an endometrial ablation in 2020 following,  a benign endometrial biopsy,  that resulted in a very light menstrual flow and minimal cramping. In March 2021, however,  the patient began to have, daily dull-crampy pelvic pain that was  rated, 7/10.  She reports no factors that worsened this  pain and it would decrease its intensity to 4/10 with Tylenol.  Along with this  pain was  vaginal  bleeding that continues to require  a pad change every 3 hours.  She had been placed on Slynd a progestin only pill, following her endometrial ablation but her bleeding, once light is now heavier.  The patient has a history of hypertension that has been difficult to control so combination hormonal therapy is not an option for her management.   A pelvic ultrasound in August 2020 revealed an anteverted uterus [534 mL],: (13.7 cm from fundus to external os): 10.67 x 8.09 x 11.82 cm with  endometrium: 6.5 mm;  # 4 fibroids: posterior sub-serosal-3.97 cm; fundal intramural: 3.55 and 3.36 cm and anterior intra-mural-4.60 cm; right ovary-4.31 cm with a hemorrhagic cyst: 3.9 cm with normal A-V waveforms.  A review of both medical and surgical management options were given to the patient.  Given the chronicity of her symptoms and sub-optimal response to medical therapy,  however,  she has chosen to proceed with definitive therapy in the form of hysterectomy.  Past Medical History  OB  History: G: 2;  P: 2-0-0-2;  C-section: 2004 and 2013  GYN History: menarche: 44 YO;    LMP: 12/07/2019;    Contraception: Slynd;   Denies history of abnormal PAP smear.  Last PAP smear: 2021-normal with negative HPV  Medical History:  Tubo-ovarian Abscess, Hypertension, Endometriosis, Uterine Fibroids, Gallbladder Polyps and Anemia  Surgical History: 2004 Laparoscopic Left Salpingo-oophorectomy for Tubo-Ovarian Abscess; 2012 Left Breast Lumpectomy and 2020 Endometrial Ablation Denies problems with anesthesia;  has a  history of blood transfusions, post partum in 2004  Family History:  Cardiovascular Disease, Uterine Cancer, Cervical Cancer, Stroke and Diabetes Mellitus  Social History: Married and employed as a Pharmacist, hospital;  Denies tobacco use and occasionally uses alcohol  Medications: Slynd daily Cyclobenzaprine 5 mg every 6 hours prn Gabapentin 100 mg  as directed Montelukast 10 mg daily Naproxen 500 mg bid pc prn Nifedipine 30 mg ER daily Triamterene/HCTZ  37.5/25 mg  daily   Allergies  Allergen Reactions  . Bee Venom Hives and Swelling  . Penicillins Hives and Swelling    Documented treatment with amoxicillin in 2008 without difficulty    Denies sensitivity to peanuts, shellfish, soy, latex or adhesives.   ROS: Admits to glasses, atypical chest pain  (negative cardiology work up) and pelvic pain,  but denies headache, vision changes, nasal congestion, dysphagia, tinnitus, dizziness, hoarseness, cough,  shortness of breath, nausea, vomiting, diarrhea,constipation,  urinary frequency, urgency  dysuria, hematuria, vaginitis symptoms,  swelling  of joints,easy bruising,  myalgias, arthralgias, skin rashes, unexplained weight loss and except as is mentioned in the history of present illness, patient's review of systems is otherwise negative.     Physical Exam  Bp: 120/70;  P: 84 bpm; R: 16;  Weight: 197 lbs.; Height: 5'5";  BMI:32.8;  O2Sat.: 98% (room air)  Neck: supple without  masses or thyromegaly Lungs: clear to auscultation Heart: regular rate and rhythm Abdomen: firm tender mass from pelvis to 3 fingers below umbilicus  Pelvic:EGBUS- wnl; vagina-normal rugae; uterus-not mobile 16-18 weeks size, cervix without lesions or motion tenderness; adnexae-no tenderness or masses Extremities:  no clubbing, cyanosis or edema   Assesment:   Chronic Pelvic Pain                        Menorrhagia             Fibroids  Disposition: Disposition: A discussion was held with patient regarding the indication for her procedure(s) along with risks and benefits. The robot assisted hysterectomy risks and benefits are as follows:   Benefits of the robotic approach include lesser postoperative pain, less blood loss during surgery, reduced risk of injury to other organs due to better visualization with a 3-D HD 10 times magnifying camera, shorter hospital stay between 0-1 night and rapid recovery with return to daily routine in 2-3 weeks. Although robot assisted hysterectomy has a longer operative time than traditional laparotomy, the benefits usually outweigh the risks in a patient with good medical history.    Risks include reaction to anesthesia, bleeding, infection, injury to other organs, need for laparotomy, transient post-operative facial edema, increased risk of pelvic prolapse (associated with any hysterectomy)  as well as earlier onset of menopause.The patient was also advised that given her history of previous abdominal surgeries,   to include 2 C-sections, she may possibly  have a supra-cervical hysterectomy should safe removal of her cervix be impossible.   In the event of a supra-cervical procedure,   Pap smear screening would continue as currently recommended, monthly bleeding is possible despite cauterization of cervical canal and a small but possible risk of cervical fibroid development is present.  Preservation or preventative removal of the ovaries was also reviewed and left  to the patient's discretion.  The patient was given a Miralax Bowel Prep to complete the day before her surgery.    The patient verbalized understanding of these risks and has consented to proceed with a Robot Assisted Total Laparoscopic Hysterectomy with Right Salpingectomy, Possible Total Abdominal Hysterectomy and Possible Supra-cervical Hysterectomy at [facility] on January 13, 2020.   CSN# [id number]   Tobechukwu Emmick J. Florene Glen, PA-C  for Dr. Dede Query. Rivard

## 2020-01-05 NOTE — Progress Notes (Signed)
[place], [place] [address] [place] [address] Phone: [phone] Fax: [phone]  [id number] - [place], [place] - [address] AT [place] [address] [place] [address] Phone: [phone] Fax: [phone]      Your procedure is scheduled on Wednesday December 1st.  Report to [facility] Main Entrance "A" at 11:30 A.M., and check in at the Admitting office.  Call this number if you have problems the morning of surgery:  [phone]  Call [phone] if you have any questions prior to your surgery date Monday-Friday 8am-4pm    Remember:  Do not eat after midnight the night before your surgery  You may drink clear liquids until 10:30 the morning of your surgery.   Clear liquids allowed are: Water, Non-Citrus Juices (without pulp), Carbonated Beverages, Clear Tea, Black Coffee Only, and Gatorade    Take these medicines the morning of surgery with A SIP OF WATER   gabapentin (NEURONTIN) 100 MG capsule  montelukast (SINGULAIR) 10 MG tablet  NIFEdipine (PROCARDIA-XL/ADALAT-CC/NIFEDICAL-XL) 30 MG 24 hr tablet  omeprazole (PRILOSEC) 20 MG capsule  SLYND 4 MG TABS    IF NEEDED  acetaminophen (TYLENOL) 500 MG tablet   fluticasone (FLONASE) 50 MCG/ACT nasal spray  As of today, STOP taking any Aspirin (unless otherwise instructed by your surgeon) Aleve, Naproxen, Ibuprofen, Motrin, Advil, Goody's, BC's, all herbal medications, fish oil, and all vitamins.                      Do not wear jewelry, make up, or nail polish            Do not wear lotions, powders, perfumes, or deodorant.            Do not shave 48 hours prior to surgery.              Do not bring valuables to the hospital.            [facility] is not responsible for any belongings or valuables.  Do NOT Smoke (Tobacco/Vaping) or drink Alcohol 24 hours prior to your procedure If you use a CPAP at night, you may bring all  equipment for your overnight stay.   Contacts, glasses, dentures or bridgework may not be worn into surgery.      For patients admitted to the hospital, discharge time will be determined by your treatment team.   Patients discharged the day of surgery will not be allowed to drive home, and someone needs to stay with them for 24 hours.    Special instructions:   [facility]- Preparing For Surgery  Before surgery, you can play an important role. Because skin is not sterile, your skin needs to be as free of germs as possible. You can reduce the number of germs on your skin by washing with CHG (chlorahexidine gluconate) Soap before surgery.  CHG is an antiseptic cleaner which kills germs and bonds with the skin to continue killing germs even after washing.    Oral Hygiene is also important to reduce your risk of infection.  Remember - BRUSH YOUR TEETH THE MORNING OF SURGERY WITH YOUR REGULAR TOOTHPASTE  Please do not use if you have an allergy to CHG or antibacterial soaps. If your skin becomes reddened/irritated stop using the CHG.  Do not shave (including legs and underarms) for at least 48 hours prior to  first CHG shower. It is OK to shave your face.  Please follow these instructions carefully.   1. Shower the NIGHT BEFORE SURGERY and the MORNING OF SURGERY with CHG Soap.   2. If you chose to wash your hair, wash your hair first as usual with your normal shampoo.  3. After you shampoo, rinse your hair and body thoroughly to remove the shampoo.  4. Use CHG as you would any other liquid soap. You can apply CHG directly to the skin and wash gently with a scrungie or a clean washcloth.   5. Apply the CHG Soap to your body ONLY FROM THE NECK DOWN.  Do not use on open wounds or open sores. Avoid contact with your eyes, ears, mouth and genitals (private parts). Wash Face and genitals (private parts)  with your normal soap.   6. Wash thoroughly, paying special attention to the area where your  surgery will be performed.  7. Thoroughly rinse your body with warm water from the neck down.  8. DO NOT shower/wash with your normal soap after using and rinsing off the CHG Soap.  9. Pat yourself dry with a CLEAN TOWEL.  10. Wear CLEAN PAJAMAS to bed the night before surgery  11. Place CLEAN SHEETS on your bed the night of your first shower and DO NOT SLEEP WITH PETS.   Day of Surgery: Wear Clean/Comfortable clothing the morning of surgery Do not apply any deodorants/lotions.   Remember to brush your teeth WITH YOUR REGULAR TOOTHPASTE.   Please read over the following fact sheets that you were given.

## 2020-01-06 ENCOUNTER — Encounter (HOSPITAL_COMMUNITY): Payer: Self-pay

## 2020-01-06 ENCOUNTER — Encounter (HOSPITAL_COMMUNITY)
Admission: RE | Admit: 2020-01-06 | Discharge: 2020-01-06 | Disposition: A | Discharge status: Home / Self Care | Payer: BC Managed Care – PPO | Source: Ambulatory Visit | Attending: Obstetrics and Gynecology | Admitting: Obstetrics and Gynecology

## 2020-01-06 ENCOUNTER — Encounter (HOSPITAL_COMMUNITY): Payer: BC Managed Care – PPO

## 2020-01-06 ENCOUNTER — Other Ambulatory Visit: Payer: Self-pay

## 2020-01-06 DIAGNOSIS — D259 Leiomyoma of uterus, unspecified: Secondary | ICD-10-CM | POA: Insufficient documentation

## 2020-01-06 DIAGNOSIS — Z79899 Other long term (current) drug therapy: Secondary | ICD-10-CM | POA: Insufficient documentation

## 2020-01-06 DIAGNOSIS — Z01812 Encounter for preprocedural laboratory examination: Secondary | ICD-10-CM | POA: Insufficient documentation

## 2020-01-06 DIAGNOSIS — I10 Essential (primary) hypertension: Secondary | ICD-10-CM | POA: Diagnosis not present

## 2020-01-06 LAB — CBC
HCT: 38.2 % (ref 36.0–46.0)
Hemoglobin: 11.4 g/dL — ABNORMAL LOW (ref 12.0–15.0)
MCH: 24.2 pg — ABNORMAL LOW (ref 26.0–34.0)
MCHC: 29.8 g/dL — ABNORMAL LOW (ref 30.0–36.0)
MCV: 80.9 fL (ref 80.0–100.0)
Platelets: 293 10*3/uL (ref 150–400)
RBC: 4.72 MIL/uL (ref 3.87–5.11)
RDW: 13.2 % (ref 11.5–15.5)
WBC: 5.1 10*3/uL (ref 4.0–10.5)
nRBC: 0 % (ref 0.0–0.2)

## 2020-01-06 LAB — BASIC METABOLIC PANEL
Anion gap: 9 (ref 5–15)
BUN: 15 mg/dL (ref 6–20)
CO2: 27 mmol/L (ref 22–32)
Calcium: 9.5 mg/dL (ref 8.9–10.3)
Chloride: 102 mmol/L (ref 98–111)
Creatinine, Ser: 0.99 mg/dL (ref 0.44–1.00)
GFR, Estimated: >60 mL/min (ref >60)
Glucose, Bld: 113 mg/dL — ABNORMAL HIGH (ref 70–99)
Potassium: 3.7 mmol/L (ref 3.5–5.1)
Sodium: 138 mmol/L (ref 135–145)

## 2020-01-06 LAB — TYPE AND SCREEN
ABO/RH(D): O POS
Antibody Screen: NEGATIVE

## 2020-01-06 NOTE — Progress Notes (Signed)
PCP - PA M. Hunter  Cardiologist - Dr. Bettina Gavia  Chest x-ray - Denies  EKG - 12/23/19 (E)  Stress Test - Denies  ECHO - 09/11/2018 (E)  Cardiac Cath - Denies  AICD-na PM-na LOOP-na  Sleep Study -  Denies CPAP - Denies  LABS- 01/06/20: CBC, BMP, T/S 01/11/20: COVID 01/13/20: POC UPreg  ASA- Denies  ERAS- 3 bottles  HA1C- Denies  Anesthesia- Yes- cardiac clearance 12/23/19 (E)  Pt denies having chest pain, sob, or fever at this time. All instructions explained to the pt, with a verbal understanding of the material. Pt agrees to go over the instructions while at home for a better understanding. Pt also instructed to self quarantine after being tested for COVID-19. The opportunity to ask questions was provided.   Coronavirus Screening  Have you experienced the following symptoms:  Cough yes/no: No Fever (>100.23F)  yes/no: No Runny nose yes/no: No Sore throat yes/no: No Difficulty breathing/shortness of breath  yes/no: No  Have you or a family member traveled in the last 14 days and where? yes/no: No   If the patient indicates "YES" to the above questions, their PAT will be rescheduled to limit the exposure to others and, the surgeon will be notified. THE PATIENT WILL NEED TO BE ASYMPTOMATIC FOR 14 DAYS.   If the patient is not experiencing any of these symptoms, the PAT nurse will instruct them to NOT bring anyone with them to their appointment since they may have these symptoms or traveled as well.   Please remind your patients and families that hospital visitation restrictions are in effect and the importance of the restrictions.

## 2020-01-06 NOTE — Progress Notes (Signed)
Your procedure is scheduled on Wednesday December 1st.             Report to [facility] Main Entrance "A" at 11:30 A.M., and check in at the Admitting office.             Call this number if you have problems the morning of surgery:             [phone]  Call [phone] if you have any questions prior to your surgery date Monday-Friday 8am-4pm               Remember:             Do not eat after midnight the night before your surgery  You may drink clear liquids until 10:30 the morning of your surgery.   Clear liquids allowed are: Water, Non-Citrus Juices (without pulp), Carbonated Beverages, Clear Tea, Black Coffee Only, and Gatorade  Please complete your PRE-SURGERY ENSURE drinks that was provided to you. Drink 2 bottles by 10:30pm the night before your surgery. Then drink 1 bottle by 10:30am the day of surgery. Please, if able, drink it in one setting. DO NOT SIP.                           Take these medicines the morning of surgery with A SIP OF WATER              gabapentin (NEURONTIN) 100 MG capsule             montelukast (SINGULAIR) 10 MG tablet             NIFEdipine (PROCARDIA-XL/ADALAT-CC/NIFEDICAL-XL) 30 MG 24 hr tablet             omeprazole (PRILOSEC) 20 MG capsule             SLYND 4 MG TABS                          IF NEEDED             acetaminophen (TYLENOL) 500 MG tablet                         fluticasone (FLONASE) 50 MCG/ACT nasal spray  As of today, STOP taking any Aspirin (unless otherwise instructed by your surgeon) Aleve, Naproxen, Ibuprofen, Motrin, Advil, Goody's, BC's, all herbal medications, fish oil, and all vitamins.   Do not wear jewelry, make up, or nail polish Do notwear lotions, powders, perfumes, or deodorant. Do notshave 48 hours prior to surgery.  Do notbring valuables to the hospital. [facility] is not responsible for any belongings or valuables.  Do NOT  Smoke (Tobacco/Vaping) or drink Alcohol 24 hours prior to your procedure If you use a CPAP at night, you may bring all equipment for your overnight stay.  Contacts, glasses, dentures or bridgework may not be worn into surgery.    For patients admitted to the hospital, discharge time will be determined by your treatment team.  Patients discharged the day of surgery will not be allowed to drive home, and someone needs to stay with them for 24 hours.    Special instructions:   [facility]- Preparing For Surgery  Before surgery, you can play an important role. Because skin is not sterile, your skin needs to be as free of germs as possible. You can reduce the number of germs  on your skin by washing with CHG (chlorahexidine gluconate) Soap before surgery.  CHG is an antiseptic cleaner which kills germs and bonds with the skin to continue killing germs even after washing.    Oral Hygiene is also important to reduce your risk of infection.  Remember - BRUSH YOUR TEETH THE MORNING OF SURGERY WITH YOUR REGULAR TOOTHPASTE  Please do not use if you have an allergy to CHG or antibacterial soaps. If your skin becomes reddened/irritated stop using the CHG.  Do not shave (including legs and underarms) for at least 48 hours prior to first CHG shower. It is OK to shave your face.  Please follow these instructions carefully.                                                                                                                               1. Shower the NIGHT BEFORE SURGERY and the MORNING OF SURGERY with CHG Soap.   2. If you chose to wash your hair, wash your hair first as usual with your normal shampoo.  3. After you shampoo, rinse your hair and body thoroughly to remove the shampoo.  4. Use CHG as you would any other liquid soap. You can apply CHG directly to the skin and wash gently with a scrungie or a clean washcloth.   5. Apply the CHG Soap to your body ONLY FROM THE NECK  DOWN.  Do not use on open wounds or open sores. Avoid contact with your eyes, ears, mouth and genitals (private parts). Wash Face and genitals (private parts)  with your normal soap.   6. Wash thoroughly, paying special attention to the area where your surgery will be performed.  7. Thoroughly rinse your body with warm water from the neck down.  8. DO NOT shower/wash with your normal soap after using and rinsing off the CHG Soap.  9. Pat yourself dry with a CLEAN TOWEL.  10. Wear CLEAN PAJAMAS to bed the night before surgery  11. Place CLEAN SHEETS on your bed the night of your first shower and DO NOT SLEEP WITH PETS.   Day of Surgery: Wear Clean/Comfortable clothing the morning of surgery Do notapply any deodorants/lotions.  Remember to brush your teeth WITH YOUR REGULAR TOOTHPASTE.  Please read over the following fact sheets that you were given.

## 2020-01-08 NOTE — Progress Notes (Signed)
Anesthesia Chart Review:   Case: [id number] Date/Time: 01/13/20 1320   Procedures:      XI ROBOTIC ASSISTED LAPAROSCOPIC HYSTERECTOMY AND SALPINGECTOMY (Bilateral )     HYSTERECTOMY ABDOMINAL (N/A )     EXPLORATORY LAPAROTOMY (N/A )     APPLICATION OF CELL SAVER (N/A )   Anesthesia type: Choice   Pre-op diagnosis: fibroids   Location: [facility] OR ROOM 10 / [place] [facility]   Surgeons: Delsa Bern, MD      DISCUSSION:  Pt is a 44 year old with hx HTN   VS: BP 120/78   Pulse 75   Temp 36.8 C (Oral)   Resp 18   Ht 5\' 6"  (1.676 m)   Wt 88.1 kg   LMP 01/04/2020 (Exact Date)   SpO2 98%   BMI 31.35 kg/m    PROVIDERS: - PCP is Jefm Petty, MD - Cardiologist is Shirlee More, MD. Last office visit 12/23/19 documents "From a cardiology perspective she is optimized for planned GYN surgery hysterectomy"    LABS: Labs reviewed: Acceptable for surgery. (all labs ordered are listed, but only abnormal results are displayed)  Labs Reviewed  BASIC METABOLIC PANEL - Abnormal; Notable for the following components:      Result Value   Glucose, Bld 113 (*)    All other components within normal limits  CBC - Abnormal; Notable for the following components:   Hemoglobin 11.4 (*)    MCH 24.2 (*)    MCHC 29.8 (*)    All other components within normal limits  TYPE AND SCREEN     IMAGES: US abdomen 12/11/19:  1. Suspected gallbladder polyps measuring up to 7 mm in size, recommend ultrasound follow-up in 6 months assuming no risk factors for gallbladder malignancy. 2. Otherwise negative right upper quadrant abdominal ultrasound   EKG 12/23/19: NSR   CV: CT coronary morphology 09/12/18:  1. Coronary artery calcium score 0 Agatston units. This suggests low risk for future cardiac events. 2.  No significant coronary disease was visualized.   Echo 09/11/18:  1. The left ventricle has normal systolic function with an ejection fraction of 60-65%. The cavity size was normal. There is mildly  increased  left ventricular wall thickness. Left ventricular diastolic Doppler parameters are consistent with impaired relaxation.  2. The right ventricle has normal systolic function. The cavity was normal. There is no increase in right ventricular wall thickness.  3. The aorta is normal in size and structure.  4. The inferior vena cava was normal in size with <50% respiratory  variability.     Past Medical History:  Diagnosis Date  . Abnormal electromyogram 04/15/2015  . Allergic rhinitis 04/15/2015  . Anemia    TAKES FE OCC  . Atopic dermatitis 04/15/2015  . Blood transfusion 02-16-11   '05 with ovary surgery  . Blood transfusion without reported diagnosis   . Carpal tunnel syndrome 04/15/2015  . Cervical radiculopathy at C6 04/15/2015  . Constipation 04/15/2015  . Endometriosis 04/11/2017  . Endometriosis of colon 2009  . Enlarged uterus 10/26/2019  . Fibroids 2004  . GERD (gastroesophageal reflux disease) 04/15/2015  . H/O cesarean section 07/17/2011   Pt had C/S then developed a left pelvic abscess and had ex lap with LSO. Undecided   . History of iron deficiency anemia 06/05/2016  . Hypertension   . Hypertension 2007   DR [facility]   ON MED  . Hypertensive disorder 09/05/2011   Dr Jacklynn Lewis, on procardia 30mg  XL  . Hypokalemia 09/19/2017  .  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 Metrorrhagia 04/11/2017  . Numbness and tingling in left arm 04/15/2015  . Ocular migraine 08/26/2018  . Pain in left upper arm 01/31/2019  . Palpitations 07/15/2018  . Pelvic adhesions 06/06/2011  . Pneumonia AGE 49  . Polymenorrhea 04/11/2017  . Preoperative cardiovascular examination 07/15/2018  . Status post repeat low transverse cesarean section 01/29/2012  . Umbilical hernia without obstruction and without gangrene 04/15/2015  . Uterine leiomyoma 09/05/2011    Past Surgical History:  Procedure Laterality Date  . BREAST CYST EXCISION  02/22/2011   Procedure: CYST EXCISION BREAST;  Surgeon: Haywood Lasso, MD;   Location: [facility];  Service: General;  Laterality: Left;  Removal Left Breast Mass  . CESAREAN SECTION    . CESAREAN SECTION WITH BILATERAL TUBAL LIGATION  01/28/2012   Procedure: CESAREAN SECTION WITH BILATERAL TUBAL LIGATION;  Surgeon: Alwyn Pea, MD;  Location: [facility];  Service: Obstetrics;  Laterality: Bilateral;  Repeat C/S - POSSIBLE BTL  . DILATION AND CURETTAGE OF UTERUS    . DILITATION & CURRETTAGE/HYSTROSCOPY WITH HYDROTHERMAL ABLATION N/A 07/17/2018   Procedure: DILATATION & CURETTAGE/HYSTEROSCOPY WITH FAILED HYDROTHERMAL ABLATION;  Surgeon: Delsa Bern, MD;  Location: [facility];  Service: Gynecology;  Laterality: N/A;  . HYSTEROSCOPY WITH NOVASURE N/A 07/17/2018   Procedure: HYSTEROSCOPY WITH NOVASURE;  Surgeon: Delsa Bern, MD;  Location: [facility];  Service: Gynecology;  Laterality: N/A;  . salpyngooophorectomy     LSO 2005 for TOA    MEDICATIONS: . acetaminophen (TYLENOL) 500 MG tablet  . fluticasone (FLONASE) 50 MCG/ACT nasal spray  . gabapentin (NEURONTIN) 100 MG capsule  . montelukast (SINGULAIR) 10 MG tablet  . NIFEdipine (PROCARDIA-XL/ADALAT-CC/NIFEDICAL-XL) 30 MG 24 hr tablet  . omeprazole (PRILOSEC) 20 MG capsule  . SLYND 4 MG TABS  . triamterene-hydrochlorothiazide (MAXZIDE-25) 37.5-25 MG tablet   No current facility-administered medications for this encounter.    If no changes, I anticipate pt can proceed with surgery as scheduled.   Willeen Cass, PhD, FNP-BC [facility] Stay [facility]/Anesthesiology Phone: [phone] 01/08/2020 10:43 AM

## 2020-01-08 NOTE — Anesthesia Preprocedure Evaluation (Addendum)
Anesthesia Evaluation  Patient identified by MRN, date of birth, ID band Patient awake    Reviewed: Allergy & Precautions, NPO status , Patient's Chart, lab work & pertinent test results  History of Anesthesia Complications Negative for: history of anesthetic complications  Airway Mallampati: II  TM Distance: >3 FB Neck ROM: Full    Dental  (+) Dental Advisory Given   Pulmonary neg shortness of breath, neg sleep apnea, neg COPD, neg recent URI,  Covid-19 Nucleic Acid Test Results Lab Results      Component                Value               Date                      SARSCOV2NAA              NEGATIVE            01/13/2020                [place]              NEGATIVE            01/11/2020                [place]              NOT DETECTED        07/14/2018              breath sounds clear to auscultation       Cardiovascular hypertension, Pt. on medications (-) angina(-) CHF  Rhythm:Regular  1. The left ventricle has normal systolic function with an ejection  fraction of 60-65%. The cavity size was normal. There is mildly increased  left ventricular wall thickness. Left ventricular diastolic Doppler  parameters are consistent with impaired  relaxation.  2. The right ventricle has normal systolic function. The cavity was  normal. There is no increase in right ventricular wall thickness.  3. The aorta is normal in size and structure.  4. The inferior vena cava was normal in size with <50% respiratory  variability.    Neuro/Psych  Headaches,  Neuromuscular disease negative psych ROS   GI/Hepatic Neg liver ROS, GERD  Medicated and Controlled,  Endo/Other  negative endocrine ROS  Renal/GU negative Renal ROSLab Results      Component                Value               Date                      CREATININE               0.99                01/06/2020                Musculoskeletal   Abdominal   Peds   Hematology  (+) Blood dyscrasia, anemia , Lab Results      Component                Value               Date                      WBC  5.1                 01/06/2020                HGB                      11.4 (L)            01/06/2020                HCT                      38.2                01/06/2020                MCV                      80.9                01/06/2020                PLT                      293                 01/06/2020          \   Anesthesia Other Findings   Reproductive/Obstetrics                            Anesthesia Physical Anesthesia Plan  ASA: II  Anesthesia Plan: General   Post-op Pain Management:    Induction: Intravenous  PONV Risk Score and Plan: 3 and Ondansetron and Dexamethasone  Airway Management Planned: Oral ETT  Additional Equipment: Arterial line  Intra-op Plan:   Post-operative Plan: Extubation in OR  Informed Consent: I have reviewed the patients History and Physical, chart, labs and discussed the procedure including the risks, benefits and alternatives for the proposed anesthesia with the patient or authorized representative who has indicated his/her understanding and acceptance.     Dental advisory given  Plan Discussed with: CRNA and Surgeon  Anesthesia Plan Comments: (See APP note by Durel Salts, FNP )       Anesthesia Quick Evaluation

## 2020-01-11 ENCOUNTER — Other Ambulatory Visit (HOSPITAL_COMMUNITY)
Admission: RE | Admit: 2020-01-11 | Discharge: 2020-01-11 | Disposition: A | Discharge status: Home / Self Care | Payer: BC Managed Care – PPO | Source: Ambulatory Visit | Attending: Obstetrics and Gynecology | Admitting: Obstetrics and Gynecology

## 2020-01-11 DIAGNOSIS — Z01812 Encounter for preprocedural laboratory examination: Secondary | ICD-10-CM | POA: Insufficient documentation

## 2020-01-11 DIAGNOSIS — Z20822 Contact with and (suspected) exposure to covid-19: Secondary | ICD-10-CM | POA: Insufficient documentation

## 2020-01-11 LAB — SARS CORONAVIRUS 2 (TAT 6-24 HRS): SARS Coronavirus 2: NEGATIVE

## 2020-01-13 ENCOUNTER — Inpatient Hospital Stay (HOSPITAL_COMMUNITY)
Admission: RE | Admit: 2020-01-13 | Discharge: 2020-01-15 | DRG: 743 | Disposition: A | Discharge status: Home / Self Care | Payer: BC Managed Care – PPO | Attending: Obstetrics and Gynecology | Admitting: Obstetrics and Gynecology

## 2020-01-13 ENCOUNTER — Encounter (HOSPITAL_COMMUNITY)
Admission: RE | Disposition: A | Discharge status: Home / Self Care | Payer: Self-pay | Source: Ambulatory Visit | Attending: Obstetrics and Gynecology

## 2020-01-13 ENCOUNTER — Ambulatory Visit (HOSPITAL_COMMUNITY): Payer: BC Managed Care – PPO | Admitting: Emergency Medicine

## 2020-01-13 ENCOUNTER — Other Ambulatory Visit: Payer: Self-pay

## 2020-01-13 ENCOUNTER — Encounter (HOSPITAL_COMMUNITY): Payer: Self-pay | Admitting: Obstetrics and Gynecology

## 2020-01-13 ENCOUNTER — Ambulatory Visit (HOSPITAL_COMMUNITY): Payer: BC Managed Care – PPO | Admitting: Certified Registered Nurse Anesthetist

## 2020-01-13 ENCOUNTER — Ambulatory Visit (HOSPITAL_COMMUNITY): Payer: BC Managed Care – PPO

## 2020-01-13 DIAGNOSIS — Z833 Family history of diabetes mellitus: Secondary | ICD-10-CM

## 2020-01-13 DIAGNOSIS — Z8049 Family history of malignant neoplasm of other genital organs: Secondary | ICD-10-CM | POA: Diagnosis not present

## 2020-01-13 DIAGNOSIS — K219 Gastro-esophageal reflux disease without esophagitis: Secondary | ICD-10-CM | POA: Diagnosis present

## 2020-01-13 DIAGNOSIS — Z832 Family history of diseases of the blood and blood-forming organs and certain disorders involving the immune mechanism: Secondary | ICD-10-CM | POA: Diagnosis not present

## 2020-01-13 DIAGNOSIS — Z823 Family history of stroke: Secondary | ICD-10-CM

## 2020-01-13 DIAGNOSIS — D500 Iron deficiency anemia secondary to blood loss (chronic): Secondary | ICD-10-CM | POA: Diagnosis present

## 2020-01-13 DIAGNOSIS — Z825 Family history of asthma and other chronic lower respiratory diseases: Secondary | ICD-10-CM

## 2020-01-13 DIAGNOSIS — I10 Essential (primary) hypertension: Secondary | ICD-10-CM | POA: Diagnosis present

## 2020-01-13 DIAGNOSIS — Z79899 Other long term (current) drug therapy: Secondary | ICD-10-CM | POA: Diagnosis not present

## 2020-01-13 DIAGNOSIS — Z20822 Contact with and (suspected) exposure to covid-19: Secondary | ICD-10-CM | POA: Diagnosis present

## 2020-01-13 DIAGNOSIS — R102 Pelvic and perineal pain unspecified side: Secondary | ICD-10-CM | POA: Diagnosis present

## 2020-01-13 DIAGNOSIS — Z91014 Allergy to mammalian meats: Secondary | ICD-10-CM

## 2020-01-13 DIAGNOSIS — Z8249 Family history of ischemic heart disease and other diseases of the circulatory system: Secondary | ICD-10-CM

## 2020-01-13 DIAGNOSIS — N736 Female pelvic peritoneal adhesions (postinfective): Secondary | ICD-10-CM | POA: Diagnosis present

## 2020-01-13 DIAGNOSIS — Z880 Allergy status to penicillin: Secondary | ICD-10-CM

## 2020-01-13 DIAGNOSIS — D649 Anemia, unspecified: Secondary | ICD-10-CM | POA: Diagnosis present

## 2020-01-13 DIAGNOSIS — Z419 Encounter for procedure for purposes other than remedying health state, unspecified: Secondary | ICD-10-CM

## 2020-01-13 DIAGNOSIS — G8929 Other chronic pain: Secondary | ICD-10-CM | POA: Diagnosis present

## 2020-01-13 DIAGNOSIS — N920 Excessive and frequent menstruation with regular cycle: Secondary | ICD-10-CM | POA: Diagnosis present

## 2020-01-13 DIAGNOSIS — N854 Malposition of uterus: Secondary | ICD-10-CM | POA: Diagnosis present

## 2020-01-13 DIAGNOSIS — D259 Leiomyoma of uterus, unspecified: Principal | ICD-10-CM | POA: Diagnosis present

## 2020-01-13 DIAGNOSIS — D219 Benign neoplasm of connective and other soft tissue, unspecified: Secondary | ICD-10-CM | POA: Diagnosis present

## 2020-01-13 HISTORY — PX: ABDOMINAL HYSTERECTOMY: SHX81

## 2020-01-13 HISTORY — PX: LAPAROSCOPY: SHX197

## 2020-01-13 HISTORY — PX: CYSTOSCOPY WITH STENT PLACEMENT: SHX5790

## 2020-01-13 LAB — POCT PREGNANCY, URINE: Preg Test, Ur: NEGATIVE

## 2020-01-13 LAB — SARS CORONAVIRUS 2 BY RT PCR (HOSPITAL ORDER, PERFORMED IN ~~LOC~~ HOSPITAL LAB): SARS Coronavirus 2: NEGATIVE

## 2020-01-13 SURGERY — HYSTERECTOMY, ABDOMINAL
Anesthesia: General | Site: Ureter

## 2020-01-13 MED ORDER — LACTATED RINGERS IV SOLN: INTRAVENOUS | Status: DC

## 2020-01-13 MED ORDER — PROPOFOL 10 MG/ML IV BOLUS
INTRAVENOUS | Status: AC
  Filled 2020-01-13: qty 20

## 2020-01-13 MED ORDER — ACETAMINOPHEN 500 MG PO TABS
1000.0000 mg | ORAL_TABLET | ORAL | Status: AC
  Administered 2020-01-13: 1000 mg via ORAL
  Filled 2020-01-13: qty 2

## 2020-01-13 MED ORDER — MIDAZOLAM HCL 2 MG/2ML IJ SOLN
INTRAMUSCULAR | Status: AC
  Filled 2020-01-13: qty 2

## 2020-01-13 MED ORDER — GABAPENTIN 300 MG PO CAPS
300.0000 mg | ORAL_CAPSULE | ORAL | Status: AC
  Administered 2020-01-13: 300 mg via ORAL
  Filled 2020-01-13: qty 1

## 2020-01-13 MED ORDER — KETOROLAC TROMETHAMINE 30 MG/ML IJ SOLN
INTRAMUSCULAR | Status: AC
  Filled 2020-01-13: qty 2

## 2020-01-13 MED ORDER — OXYCODONE HCL 5 MG PO TABS
5.0000 mg | ORAL_TABLET | ORAL | Status: DC | PRN
  Administered 2020-01-13 – 2020-01-14 (×2): 5 mg via ORAL
  Filled 2020-01-13 (×2): qty 1

## 2020-01-13 MED ORDER — CLINDAMYCIN PHOSPHATE 900 MG/50ML IV SOLN
900.0000 mg | INTRAVENOUS | Status: AC
  Administered 2020-01-13: 900 mg via INTRAVENOUS

## 2020-01-13 MED ORDER — PANTOPRAZOLE SODIUM 40 MG PO TBEC
40.0000 mg | DELAYED_RELEASE_TABLET | Freq: Every day | ORAL | Status: DC
  Administered 2020-01-13 – 2020-01-15 (×3): 40 mg via ORAL
  Filled 2020-01-13 (×3): qty 1

## 2020-01-13 MED ORDER — OXYCODONE HCL 5 MG PO TABS: 5.0000 mg | ORAL_TABLET | Freq: Once | ORAL | Status: DC | PRN

## 2020-01-13 MED ORDER — KETOROLAC TROMETHAMINE 30 MG/ML IJ SOLN
INTRAMUSCULAR | Status: DC | PRN
  Administered 2020-01-13: 30 mg via INTRAMUSCULAR

## 2020-01-13 MED ORDER — DEXAMETHASONE SODIUM PHOSPHATE 10 MG/ML IJ SOLN
INTRAMUSCULAR | Status: AC
  Filled 2020-01-13: qty 1

## 2020-01-13 MED ORDER — CHLORHEXIDINE GLUCONATE 0.12 % MT SOLN
15.0000 mL | Freq: Once | OROMUCOSAL | Status: AC
  Administered 2020-01-13: 15 mL via OROMUCOSAL
  Filled 2020-01-13: qty 15

## 2020-01-13 MED ORDER — ROPIVACAINE HCL 5 MG/ML IJ SOLN
INTRAMUSCULAR | Status: DC | PRN
  Administered 2020-01-13: 30 mL

## 2020-01-13 MED ORDER — GLYCOPYRROLATE PF 0.2 MG/ML IJ SOSY
PREFILLED_SYRINGE | INTRAMUSCULAR | Status: DC | PRN
  Administered 2020-01-13: .2 mg via INTRAVENOUS

## 2020-01-13 MED ORDER — LIDOCAINE HCL (PF) 2 % IJ SOLN
INTRAMUSCULAR | Status: AC
  Filled 2020-01-13: qty 10

## 2020-01-13 MED ORDER — ONDANSETRON HCL 4 MG PO TABS: 4.0000 mg | ORAL_TABLET | Freq: Four times a day (QID) | ORAL | Status: DC | PRN

## 2020-01-13 MED ORDER — POVIDONE-IODINE 10 % EX SWAB
2.0000 "application " | Freq: Once | CUTANEOUS | Status: AC
  Administered 2020-01-13: 2 via TOPICAL

## 2020-01-13 MED ORDER — ORAL CARE MOUTH RINSE: 15.0000 mL | Freq: Once | OROMUCOSAL | Status: AC

## 2020-01-13 MED ORDER — FENTANYL CITRATE (PF) 250 MCG/5ML IJ SOLN
INTRAMUSCULAR | Status: DC | PRN
  Administered 2020-01-13: 50 ug via INTRAVENOUS
  Administered 2020-01-13: 25 ug via INTRAVENOUS
  Administered 2020-01-13: 100 ug via INTRAVENOUS

## 2020-01-13 MED ORDER — MIDAZOLAM HCL 5 MG/5ML IJ SOLN
INTRAMUSCULAR | Status: DC | PRN
  Administered 2020-01-13: 2 mg via INTRAVENOUS

## 2020-01-13 MED ORDER — ENSURE PRE-SURGERY PO LIQD: 296.0000 mL | Freq: Once | ORAL | Status: DC

## 2020-01-13 MED ORDER — 0.9 % SODIUM CHLORIDE (POUR BTL) OPTIME
TOPICAL | Status: DC | PRN
  Administered 2020-01-13: 1000 mL

## 2020-01-13 MED ORDER — KETOROLAC TROMETHAMINE 30 MG/ML IJ SOLN
INTRAMUSCULAR | Status: DC | PRN
  Administered 2020-01-13: 30 mg via INTRAVENOUS

## 2020-01-13 MED ORDER — HYDROMORPHONE HCL 1 MG/ML IJ SOLN
1.0000 mg | INTRAMUSCULAR | Status: DC | PRN
  Administered 2020-01-13: 1 mg via INTRAVENOUS
  Filled 2020-01-13: qty 1

## 2020-01-13 MED ORDER — ACETAMINOPHEN 160 MG/5ML PO SOLN: 1000.0000 mg | Freq: Once | ORAL | Status: DC | PRN

## 2020-01-13 MED ORDER — CELECOXIB 200 MG PO CAPS
400.0000 mg | ORAL_CAPSULE | ORAL | Status: AC
  Administered 2020-01-13: 400 mg via ORAL
  Filled 2020-01-13: qty 2

## 2020-01-13 MED ORDER — DEXAMETHASONE SODIUM PHOSPHATE 10 MG/ML IJ SOLN
INTRAMUSCULAR | Status: DC | PRN
  Administered 2020-01-13: 10 mg via INTRAVENOUS

## 2020-01-13 MED ORDER — KETOROLAC TROMETHAMINE 30 MG/ML IJ SOLN
30.0000 mg | Freq: Four times a day (QID) | INTRAMUSCULAR | Status: DC
  Administered 2020-01-14 (×3): 30 mg via INTRAVENOUS
  Filled 2020-01-13 (×3): qty 1

## 2020-01-13 MED ORDER — SCOPOLAMINE 1 MG/3DAYS TD PT72
1.0000 | MEDICATED_PATCH | TRANSDERMAL | Status: DC
  Administered 2020-01-13: 1.5 mg via TRANSDERMAL
  Filled 2020-01-13: qty 1

## 2020-01-13 MED ORDER — CLINDAMYCIN PHOSPHATE 900 MG/50ML IV SOLN
INTRAVENOUS | Status: AC
  Filled 2020-01-13: qty 50

## 2020-01-13 MED ORDER — PHENYLEPHRINE HCL-NACL 10-0.9 MG/250ML-% IV SOLN
INTRAVENOUS | Status: DC | PRN
  Administered 2020-01-13: 60 ug/min via INTRAVENOUS

## 2020-01-13 MED ORDER — LIDOCAINE 2% (20 MG/ML) 5 ML SYRINGE
INTRAMUSCULAR | Status: DC | PRN
  Administered 2020-01-13: 80 mg via INTRAVENOUS
  Administered 2020-01-13: 20 mg via INTRAVENOUS

## 2020-01-13 MED ORDER — ACETAMINOPHEN 10 MG/ML IV SOLN
1000.0000 mg | Freq: Once | INTRAVENOUS | Status: DC | PRN
  Administered 2020-01-13: 1000 mg via INTRAVENOUS

## 2020-01-13 MED ORDER — SODIUM CHLORIDE (PF) 0.9 % IJ SOLN
INTRAMUSCULAR | Status: DC | PRN
  Administered 2020-01-13: 15 mL

## 2020-01-13 MED ORDER — SUGAMMADEX SODIUM 200 MG/2ML IV SOLN
INTRAVENOUS | Status: DC | PRN
  Administered 2020-01-13: 200 mg via INTRAVENOUS

## 2020-01-13 MED ORDER — ROCURONIUM BROMIDE 10 MG/ML (PF) SYRINGE
PREFILLED_SYRINGE | INTRAVENOUS | Status: DC | PRN
  Administered 2020-01-13: 40 mg via INTRAVENOUS
  Administered 2020-01-13: 30 mg via INTRAVENOUS
  Administered 2020-01-13: 60 mg via INTRAVENOUS

## 2020-01-13 MED ORDER — FENTANYL CITRATE (PF) 250 MCG/5ML IJ SOLN
INTRAMUSCULAR | Status: AC
  Filled 2020-01-13: qty 5

## 2020-01-13 MED ORDER — FENTANYL CITRATE (PF) 100 MCG/2ML IJ SOLN
INTRAMUSCULAR | Status: AC
  Administered 2020-01-13: 50 ug via INTRAVENOUS
  Filled 2020-01-13: qty 2

## 2020-01-13 MED ORDER — SIMETHICONE 80 MG PO CHEW
80.0000 mg | CHEWABLE_TABLET | Freq: Four times a day (QID) | ORAL | Status: DC | PRN
  Administered 2020-01-13: 80 mg via ORAL
  Filled 2020-01-13 (×2): qty 1

## 2020-01-13 MED ORDER — TRIAMTERENE-HCTZ 37.5-25 MG PO TABS
0.5000 | ORAL_TABLET | Freq: Every day | ORAL | Status: DC
  Administered 2020-01-13 – 2020-01-15 (×3): 0.5 via ORAL
  Filled 2020-01-13 (×3): qty 0.5

## 2020-01-13 MED ORDER — ESMOLOL HCL 100 MG/10ML IV SOLN
INTRAVENOUS | Status: DC | PRN
  Administered 2020-01-13: 30 mg via INTRAVENOUS
  Administered 2020-01-13: 20 mg via INTRAVENOUS

## 2020-01-13 MED ORDER — ESMOLOL HCL 100 MG/10ML IV SOLN
INTRAVENOUS | Status: AC
  Filled 2020-01-13: qty 20

## 2020-01-13 MED ORDER — ONDANSETRON HCL 4 MG/2ML IJ SOLN: 4.0000 mg | Freq: Four times a day (QID) | INTRAMUSCULAR | Status: DC | PRN

## 2020-01-13 MED ORDER — ACETAMINOPHEN 10 MG/ML IV SOLN
INTRAVENOUS | Status: AC
  Filled 2020-01-13: qty 100

## 2020-01-13 MED ORDER — MENTHOL 3 MG MT LOZG: 1.0000 | LOZENGE | OROMUCOSAL | Status: DC | PRN

## 2020-01-13 MED ORDER — ACETAMINOPHEN 500 MG PO TABS
1000.0000 mg | ORAL_TABLET | Freq: Four times a day (QID) | ORAL | Status: DC
  Administered 2020-01-14 – 2020-01-15 (×7): 1000 mg via ORAL
  Filled 2020-01-13 (×7): qty 2

## 2020-01-13 MED ORDER — ENSURE PRE-SURGERY PO LIQD: 592.0000 mL | Freq: Once | ORAL | Status: DC

## 2020-01-13 MED ORDER — ONDANSETRON HCL 4 MG/2ML IJ SOLN
INTRAMUSCULAR | Status: DC | PRN
  Administered 2020-01-13: 4 mg via INTRAVENOUS

## 2020-01-13 MED ORDER — OXYCODONE HCL 5 MG/5ML PO SOLN: 5.0000 mg | Freq: Once | ORAL | Status: DC | PRN

## 2020-01-13 MED ORDER — ROCURONIUM BROMIDE 10 MG/ML (PF) SYRINGE
PREFILLED_SYRINGE | INTRAVENOUS | Status: AC
  Filled 2020-01-13: qty 10

## 2020-01-13 MED ORDER — PROPOFOL 10 MG/ML IV BOLUS
INTRAVENOUS | Status: DC | PRN
  Administered 2020-01-13: 160 mg via INTRAVENOUS

## 2020-01-13 MED ORDER — KETAMINE HCL 100 MG/ML IJ SOLN
INTRAMUSCULAR | Status: DC | PRN
  Administered 2020-01-13: 40 mg via INTRAVENOUS
  Administered 2020-01-13: 20 mg via INTRAVENOUS
  Administered 2020-01-13: 10 mg via INTRAVENOUS

## 2020-01-13 MED ORDER — KETAMINE HCL 100 MG/ML IJ SOLN
INTRAMUSCULAR | Status: AC
  Filled 2020-01-13: qty 1

## 2020-01-13 MED ORDER — PHENYLEPHRINE 40 MCG/ML (10ML) SYRINGE FOR IV PUSH (FOR BLOOD PRESSURE SUPPORT)
PREFILLED_SYRINGE | INTRAVENOUS | Status: AC
  Filled 2020-01-13: qty 10

## 2020-01-13 MED ORDER — ACETAMINOPHEN 500 MG PO TABS: 1000.0000 mg | ORAL_TABLET | Freq: Once | ORAL | Status: DC | PRN

## 2020-01-13 MED ORDER — GENTAMICIN SULFATE 40 MG/ML IJ SOLN
5.0000 mg/kg | INTRAVENOUS | Status: AC
  Administered 2020-01-13: 430 mg via INTRAVENOUS
  Filled 2020-01-13: qty 10.75

## 2020-01-13 MED ORDER — DOCUSATE SODIUM 100 MG PO CAPS
100.0000 mg | ORAL_CAPSULE | Freq: Two times a day (BID) | ORAL | Status: DC
  Administered 2020-01-13 – 2020-01-15 (×4): 100 mg via ORAL
  Filled 2020-01-13 (×4): qty 1

## 2020-01-13 MED ORDER — NIFEDIPINE ER OSMOTIC RELEASE 30 MG PO TB24
30.0000 mg | ORAL_TABLET | Freq: Every day | ORAL | Status: DC
  Administered 2020-01-14 – 2020-01-15 (×2): 30 mg via ORAL
  Filled 2020-01-13 (×2): qty 1

## 2020-01-13 MED ORDER — FENTANYL CITRATE (PF) 100 MCG/2ML IJ SOLN
25.0000 ug | INTRAMUSCULAR | Status: DC | PRN
  Administered 2020-01-13: 50 ug via INTRAVENOUS

## 2020-01-13 MED ORDER — GABAPENTIN 100 MG PO CAPS: 100.0000 mg | ORAL_CAPSULE | Freq: Every day | ORAL | Status: DC | PRN

## 2020-01-13 MED ORDER — SODIUM CHLORIDE 0.9 % IR SOLN
Status: DC | PRN
  Administered 2020-01-13: 3000 mL via INTRAVESICAL

## 2020-01-13 MED ORDER — ONDANSETRON HCL 4 MG/2ML IJ SOLN
INTRAMUSCULAR | Status: AC
  Filled 2020-01-13: qty 2

## 2020-01-13 SURGICAL SUPPLY — 82 items
ADH SKN CLS APL DERMABOND .7 (GAUZE/BANDAGES/DRESSINGS) ×4
APL SKNCLS STERI-STRIP NONHPOA (GAUZE/BANDAGES/DRESSINGS) ×4
BENZOIN TINCTURE PRP APPL 2/3 (GAUZE/BANDAGES/DRESSINGS) ×5 IMPLANT
CANISTER SUCT 3000ML PPV (MISCELLANEOUS) ×5 IMPLANT
COVER BACK TABLE 60X90IN (DRAPES) ×5 IMPLANT
COVER SURGICAL LIGHT HANDLE (MISCELLANEOUS) ×3 IMPLANT
COVER TIP SHEARS 8 DVNC (MISCELLANEOUS) ×4 IMPLANT
COVER TIP SHEARS 8MM DA VINCI (MISCELLANEOUS) ×5
COVER WAND RF STERILE (DRAPES) ×5 IMPLANT
DECANTER SPIKE VIAL GLASS SM (MISCELLANEOUS) ×10 IMPLANT
DEFOGGER SCOPE WARMER CLEARIFY (MISCELLANEOUS) ×5 IMPLANT
DERMABOND ADVANCED (GAUZE/BANDAGES/DRESSINGS) ×1
DERMABOND ADVANCED .7 DNX12 (GAUZE/BANDAGES/DRESSINGS) ×4 IMPLANT
DRAPE CESAREAN BIRTH W POUCH (DRAPES) ×3 IMPLANT
DRAPE UTILITY XL STRL (DRAPES) ×5 IMPLANT
DRAPE WARM FLUID 44X44 (DRAPES) ×5 IMPLANT
DRSG OPSITE POSTOP 4X10 (GAUZE/BANDAGES/DRESSINGS) ×5 IMPLANT
DURAPREP 26ML APPLICATOR (WOUND CARE) ×5 IMPLANT
ELECT REM PT RETURN 9FT ADLT (ELECTROSURGICAL) ×5
ELECTRODE REM PT RTRN 9FT ADLT (ELECTROSURGICAL) ×4 IMPLANT
GAUZE 4X4 16PLY RFD (DISPOSABLE) ×11 IMPLANT
GLOVE BIO SURGEON STRL SZ7 (GLOVE) ×5 IMPLANT
GLOVE BIOGEL PI IND STRL 7.0 (GLOVE) ×20 IMPLANT
GLOVE BIOGEL PI INDICATOR 7.0 (GLOVE) ×5
GLOVE ECLIPSE 6.5 STRL STRAW (GLOVE) ×21 IMPLANT
GLOVE SURG SS PI 7.0 STRL IVOR (GLOVE) ×3 IMPLANT
GOWN STRL REUS W/ TWL LRG LVL3 (GOWN DISPOSABLE) ×16 IMPLANT
GOWN STRL REUS W/TWL LRG LVL3 (GOWN DISPOSABLE) ×25
GUIDEWIRE ANG ZIPWIRE 038X150 (WIRE) ×3 IMPLANT
HIBICLENS CHG 4% 4OZ BTL (MISCELLANEOUS) ×8 IMPLANT
IRRIGATION STRYKERFLOW (MISCELLANEOUS) ×4 IMPLANT
IRRIGATOR STRYKERFLOW (MISCELLANEOUS) ×5
KIT TURNOVER KIT B (KITS) ×5 IMPLANT
LEGGING LITHOTOMY PAIR STRL (DRAPES) ×8 IMPLANT
NDL HYPO 21X1 ECLIPSE (NEEDLE) IMPLANT
NEEDLE HYPO 21X1 ECLIPSE (NEEDLE) ×5 IMPLANT
NEEDLE HYPO 22GX1.5 SAFETY (NEEDLE) ×5 IMPLANT
NS IRRIG 1000ML POUR BTL (IV SOLUTION) ×5 IMPLANT
OBTURATOR OPTICAL STANDARD 8MM (TROCAR) ×5
OBTURATOR OPTICAL STND 8 DVNC (TROCAR) ×4
OBTURATOR OPTICALSTD 8 DVNC (TROCAR) ×4 IMPLANT
OCCLUDER COLPOPNEUMO (BALLOONS) ×5 IMPLANT
PACK ROBOT WH (CUSTOM PROCEDURE TRAY) ×5 IMPLANT
PACK ROBOTIC GOWN (GOWN DISPOSABLE) ×5 IMPLANT
PACK TRENDGUARD 450 HYBRID PRO (MISCELLANEOUS) ×4 IMPLANT
PAD ARMBOARD 7.5X6 YLW CONV (MISCELLANEOUS) ×8 IMPLANT
PAD OB MATERNITY 4.3X12.25 (PERSONAL CARE ITEMS) ×5 IMPLANT
POUCH LAPAROSCOPIC INSTRUMENT (MISCELLANEOUS) ×2 IMPLANT
PROTECTOR NERVE ULNAR (MISCELLANEOUS) ×9 IMPLANT
RETRACTOR WOUND ALXS 34CM XLRG (MISCELLANEOUS) ×2 IMPLANT
RTRCTR WOUND ALEXIS 34CM XLRG (MISCELLANEOUS) ×5
SEAL CANN UNIV 5-8 DVNC XI (MISCELLANEOUS) ×12 IMPLANT
SEAL XI 5MM-8MM UNIVERSAL (MISCELLANEOUS) ×10
SET IRRIG Y TYPE TUR BLADDER L (SET/KITS/TRAYS/PACK) ×3 IMPLANT
SET TRI-LUMEN FLTR TB AIRSEAL (TUBING) ×5 IMPLANT
SPONGE INTESTINAL PEANUT (DISPOSABLE) ×2 IMPLANT
SPONGE LAP 18X18 RF (DISPOSABLE) ×17 IMPLANT
SPONGE SURGIFOAM ABS GEL SZ50 (HEMOSTASIS) ×3 IMPLANT
STRIP CLOSURE SKIN 1/2X4 (GAUZE/BANDAGES/DRESSINGS) ×5 IMPLANT
SUT MNCRL AB 3-0 PS2 27 (SUTURE) ×10 IMPLANT
SUT PLAIN 3 0 CT 1 27 (SUTURE) ×3 IMPLANT
SUT VIC AB 0 CT1 18XCR BRD8 (SUTURE) ×10 IMPLANT
SUT VIC AB 0 CT1 27 (SUTURE) ×15
SUT VIC AB 0 CT1 27XBRD ANBCTR (SUTURE) ×14 IMPLANT
SUT VIC AB 0 CT1 8-18 (SUTURE) ×10
SUT VIC AB 2-0 UR6 27 (SUTURE) ×4 IMPLANT
SUT VIC AB 3-0 CT1 36 (SUTURE) ×9 IMPLANT
SUT VIC AB 3-0 SH 27 (SUTURE) ×10
SUT VIC AB 3-0 SH 27X BRD (SUTURE) ×4 IMPLANT
SUT VICRYL 0 TIES 12 18 (SUTURE) ×5 IMPLANT
SUT VICRYL 0 UR6 27IN ABS (SUTURE) ×2 IMPLANT
SYR CONTROL 10ML LL (SYRINGE) ×5 IMPLANT
TIP UTERINE 6.7X6CM WHT DISP (MISCELLANEOUS) ×3 IMPLANT
TOWEL GREEN STERILE (TOWEL DISPOSABLE) ×5 IMPLANT
TOWEL GREEN STERILE FF (TOWEL DISPOSABLE) ×15 IMPLANT
TRAY FOLEY W/BAG SLVR 14FR (SET/KITS/TRAYS/PACK) ×5 IMPLANT
TRENDGUARD 450 HYBRID PRO PACK (MISCELLANEOUS) ×5
TROCAR PORT AIRSEAL 8X120 (TROCAR) ×5 IMPLANT
TUBE CONNECTING 20X1/4 (TUBING) ×3 IMPLANT
UNDERPAD 30X36 HEAVY ABSORB (UNDERPADS AND DIAPERS) ×8 IMPLANT
WATER STERILE IRR 1000ML POUR (IV SOLUTION) ×5 IMPLANT
YANKAUER SUCT BULB TIP NO VENT (SUCTIONS) ×3 IMPLANT

## 2020-01-13 NOTE — Anesthesia Procedure Notes (Signed)
Procedure Name: Intubation Performed by: Quamir Willemsen H, CRNA Pre-anesthesia Checklist: Patient identified, Emergency Drugs available, Suction available and Patient being monitored Patient Re-evaluated:Patient Re-evaluated prior to induction Oxygen Delivery Method: Circle System Utilized Preoxygenation: Pre-oxygenation with 100% oxygen Induction Type: IV induction Ventilation: Mask ventilation without difficulty Laryngoscope Size: Miller and 2 Grade View: Grade I Tube type: Oral Tube size: 7.5 mm Number of attempts: 1 Airway Equipment and Method: Stylet and Oral airway Placement Confirmation: ETT inserted through vocal cords under direct vision,  positive ETCO2 and breath sounds checked- equal and bilateral Secured at: 22 cm Tube secured with: Tape Dental Injury: Teeth and Oropharynx as per pre-operative assessment        

## 2020-01-13 NOTE — Transfer of Care (Signed)
Immediate Anesthesia Transfer of Care Note  Patient: INNA TISDELL  Procedure(s) Performed: ABDOMINAL SUPRACERVICAL HYSTERECTOMY with RIGHT SALPINGECTOMY (N/A Abdomen) APPLICATION OF CELL SAVER (N/A Abdomen) CYSTOSCOPY WITH STENT PLACEMENT (Bilateral Ureter) LAPAROSCOPY DIAGNOSTIC (N/A Abdomen)  Patient Location: PACU  Anesthesia Type:General  Level of Consciousness: drowsy  Airway & Oxygen Therapy: Patient Spontanous Breathing  Post-op Assessment: Report given to RN and Post -op Vital signs reviewed and stable  Post vital signs: Reviewed and stable  Last Vitals:  Vitals Value Taken Time  BP    Temp    Pulse    Resp    SpO2      Last Pain:  Vitals:   01/13/20 1158  TempSrc:   PainSc: 0-No pain      Patients Stated Pain Goal: 2 (19/14/78 2956)  Complications: No complications documented.

## 2020-01-13 NOTE — Progress Notes (Signed)
Patient presented to short stay with congestion and sneezing.  Patient tested negative for covid on 11/29.  Patient states that symptoms started this morning.  Received verbal order from Dr. Ermalene Postin to re-test patient for covid.  Dr. Cletis Media aware.

## 2020-01-13 NOTE — Op Note (Signed)
Preoperative diagnosis: uterine fibroids  Postoperative diagnosis: Same severe pelvic adhesions  Anesthesia: Gen.  Anesthesiologist: Dr. Linna Caprice  Procedure: Diagnostic laparoscopy followed by supracervical  abdominal hysterectomy with right salpyngectomy  Surgeon: Dr. Cletis Media  Asst.: Earnstine Regal PA-C  Estimated blood loss: 200 cc  Procedure:  After being informed of the planned procedure with possible complications including but not limited to bleeding, infection, injury to other organs, need for laparotomy, possible supracervical approach,informed consent is obtained and patient is taken to or #10.  Patient  is positionned in lithotomy position, arms padded and tucked on patient's side, knee-high sequential compressive stockings,  prepped and draped in a sterile fashion.  UROLOGY: Dr Lovena Neighbours performs pre-operative cystoscopy with placement of ureteral stents without complications.  Pelvic exam: Normal external genitalia, uterus enlarged to 20 weeks with limited mobility.  A weighted speculum is inserted in the vagina and the anterior lip of the cervix is grasped with a tenaculum forcep. We proceed with a paracervical block and vaginal infiltration using ropivacaine 0.5% diluted 1 in 1 with saline. The uterus was then sounded at 6 cm due to obliterated uterine cavity by previous endometrial ablation.We easily dilate the cervix using Hegar dilator to  #27 which allows for difficult placement of the intrauterine RUMI manipulator with a 3.5 KOH ring and a vaginal occluder. The ring is sutured to the cervix with 0 Vicryl.  Trocar placement is decided. We infiltrate 8 cm above the umbilicus with 10 cc of ropivacaine per protocol and perform a 10 mm vertical incision which is brought down bluntly to the fascia. The fascia is identified and grasped with Coker forceps. The fascia is incised with Mayo scissors. Peritoneum is entered bluntly. A pursestring suture of 0 Vicryl is placed on the fascia  and a 10 mm Hassan trocar is easily inserted in the abdominal cavity held in placed with a Purstring suture. This allows for easy insufflation of a pneumoperitoneum using warmed CO2 at a maximum pressure of 15 mm of mercury. The camera is inserted in the abdomen for evaluation.  OBSERVATION: Greatly enlarged uterus filling the pelvis above the pelvic rim with limited mobility Omental adhesions with abdominal wall Unable to visualize pelvic sidewalls Anterior cul-de-sac obliterated to upper 1/3 of the uterus Normal appendix Unable to visualize right tube and ovary  Decision is made to convert to vertical laparotomy. Drapes are removed.Patient is repositioned  in the dorsal decubitus position, prepped and draped in a sterile fashion.  We perform a vertical incision form pubic bone to umbilicus. The fascia is incised. Peritoneum is entered bluntly. Alexis  Retractor is  positioned easily after sharp lysis of omental adhesions. Bowels are retracted with abdominal packings.  The uterus is grasped with Corkscrew manipulator. The right round ligament is identified and  sutured with a transfix suture of 0 Vicryl and section with cautery. The right tube is clamped with Rogers forceps, sectioned and sutured with 0-Vicryl. The right utero-ovarian ligament is clamped, sectioned and sutured with a transfixed suture of 0-Vicryl. We now bluntly dissect adhesions between bowel and posterior cul-de-sac. We proceed with sharp dissection of bladder from the uterine body, leaving uterine serosa on the bladder. The plan of dissection allows to reach the vascular pedicle on the right. Right uterine vessels are clamped, sectioned and sutures with 0-Vicryl after locating the right ureter by palpation.   On the left side: round ligament, tube and ovary are absent.We clamp at the level of the uterine artery after locating the ureter.Vascular pedicle is  clamped, sectioned and sutured with 0-Vicryl. At this point, the uterus  detaches from the cervix. Further anterior dissection is impossible due to dense adhesions. Decision is made to conclude the procedure with supracervical hysterectomy.  Hemostasis on the left is completed with figure-of eight sutures of 3-0 Vicryl. We irrigated profusely with warm saline and confirm adequate hemostasis  Both ureters are visualized and palpated.  All sponges and retractors are removed. Under fascia hemostasis is completed with cauterization. The fascia is closed with interrupted Smith-Jones sutures of 0 Vicryl.The wound is irrigated with warm saline and hemostasis is completed with cauterization.The subcuticular layer is closed with interrupted sutures of 3-0 Plain. The skin is closed with a subcuticular suture of 3-0 Monocryl and Steri-Strips. The trocar port is closed with previously placed fascial 0-Vicryl suture and subcuticular suture of 3-0 Monocryl.  Instruments and sponge count is complete 2.   Estimated blood loss is 200 cc.  The procedure is well tolerated by the patient is taken to recovery room in a well and stable condition.  Specimen: Uterus without cervix and right tube sent to pathology. Specimen weighed 705 g

## 2020-01-13 NOTE — Anesthesia Procedure Notes (Signed)
Arterial Line Insertion Start/End12/02/2019 2:20 PM, 01/13/2020 2:30 PM Performed by: Milford Cage, CRNA, CRNA  Patient location: Pre-op. Preanesthetic checklist: patient identified, IV checked, site marked, risks and benefits discussed, surgical consent, monitors and equipment checked, pre-op evaluation, timeout performed and anesthesia consent Lidocaine 1% used for infiltration Left, radial was placed Catheter size: 20 G Hand hygiene performed , maximum sterile barriers used  and Seldinger technique used  Attempts: 1 Procedure performed without using ultrasound guided technique. Following insertion, dressing applied. Post procedure assessment: normal and unchanged

## 2020-01-13 NOTE — Op Note (Signed)
Operative Note  Preoperative diagnosis:  1.  Uterine fibroids  Postoperative diagnosis: Same  Procedure(s): 1.  Cystoscopy with bilateral ureteral stent placement  Surgeon: Ellison Hughs, MD  Assistants:  None  Anesthesia:  General  Complications:  None  EBL: Less than 5 mL  Specimens: 1.  None  Drains/Catheters: 1.  Bilateral 6 French, 24 cm JJ stents with tethers secured to the 16 French Foley catheter  Intraoperative findings:   1. Stents in good position 2. No intravesical lesions  Indication:  Terry Bryant is a 44 y.o. female with enlarged uterine fibroids causing pelvic pain.  She is undergoing robotic hysterectomy with Dr. Cletis Media.  Urology  has been consulted to place ureteral stents to aid in ureteral identification during the dissection.  She has been consented for the above procedures, voices understanding wished to proceed.  Description of procedure:  After informed consent was obtained, the patient was brought to the operating room and general endotracheal anesthesia was administered. The patient was then placed in the dorsolithotomy position and prepped and draped in the usual sterile fashion. A timeout was performed. A 23 French rigid cystoscope was then inserted into the urethral meatus and advanced into the bladder under direct vision. A complete bladder survey revealed no intravesical pathology.  A Glidewire was then used intubate the left ureteral orifice and advanced up to the left renal pelvis, or fluoroscopic guidance.  A 6 French, 24 cm JJ stent was then advanced over the Glidewire and into good position within the left collecting system, confirming placement via fluoroscopy.  A right-sided ureteral stent was placed in a similar fashion.  The tether of both stents were left intact.  A 16 French Foley catheter was then placed with return of clear irrigant.  The catheter balloon was inflated with 10 mL of sterile water.  The tether of the stents were  then secured to the Foley catheter.  The patient tolerated the procedure well and Dr. Cletis Media proceeded with her portion of the case.  Plan: Remove Foley catheter and ureteral stents is at the discretion of the primary team.

## 2020-01-13 NOTE — Anesthesia Postprocedure Evaluation (Signed)
Anesthesia Post Note  Patient: Terry Bryant  Procedure(s) Performed: ABDOMINAL SUPRACERVICAL HYSTERECTOMY with RIGHT SALPINGECTOMY (N/A Abdomen) APPLICATION OF CELL SAVER (N/A Abdomen) CYSTOSCOPY WITH STENT PLACEMENT (Bilateral Ureter) LAPAROSCOPY DIAGNOSTIC (N/A Abdomen)     Patient location during evaluation: PACU Anesthesia Type: General Level of consciousness: awake and alert, patient cooperative and oriented Pain management: pain level controlled (pain improving) Vital Signs Assessment: post-procedure vital signs reviewed and stable Respiratory status: spontaneous breathing, nonlabored ventilation and respiratory function stable Cardiovascular status: blood pressure returned to baseline and stable Postop Assessment: no apparent nausea or vomiting Anesthetic complications: no   No complications documented.  Last Vitals:  Vitals:   01/13/20 1945 01/13/20 2000  BP: 114/72 120/80  Pulse: 61 62  Resp: 18 16  Temp:  36.7 C  SpO2: 100% 98%    Last Pain:  Vitals:   01/13/20 2000  TempSrc:   PainSc: 5                  Julez Huseby,E. Thu Baggett

## 2020-01-13 NOTE — OR Nursing (Signed)
Pt is awake,alert and oriented.Pt and/or family verbalized understanding of poc and discharge instructions. Reviewed admission and on going care with receiving RN. Pt is in NAD at this time and is ready to be transferred to floor. Will con't to monitor until pt is transferred. Belongings on bed with patient  

## 2020-01-13 NOTE — Plan of Care (Signed)
  Problem: Education: Goal: Knowledge of General Education information will improve Description Including pain rating scale, medication(s)/side effects and non-pharmacologic comfort measures Outcome: Progressing   

## 2020-01-13 NOTE — Consult Note (Signed)
Urology Consult   Physician requesting consult: Delsa Bern, MD  Reason for consult: Ureteral stent placement  History of Present Illness: Terry Bryant is a 44 y.o. female who is undergoing a robotic hysterectomy with Dr. Cletis Media.  She has a prior history of a tubo-ovarian abscess and Dr. Cletis Media is requesting ureteral stent placement to aid in ureteral identification intraoperatively.  The patient denies a history of voiding or storage urinary symptoms, hematuria, UTIs, STDs, urolithiasis, GU malignancy/trauma/surgery.  Past Medical History:  Diagnosis Date  . Abnormal electromyogram 04/15/2015  . Allergic rhinitis 04/15/2015  . Anemia    TAKES FE OCC  . Atopic dermatitis 04/15/2015  . Blood transfusion 02-16-11   '05 with ovary surgery  . Blood transfusion without reported diagnosis   . Carpal tunnel syndrome 04/15/2015  . Cervical radiculopathy at C6 04/15/2015  . Constipation 04/15/2015  . Endometriosis 04/11/2017  . Endometriosis of colon 2009  . Enlarged uterus 10/26/2019  . Fibroids 2004  . GERD (gastroesophageal reflux disease) 04/15/2015  . H/O cesarean section 07/17/2011   Pt had C/S then developed a left pelvic abscess and had ex lap with LSO. Undecided   . History of iron deficiency anemia 06/05/2016  . Hypertension   . Hypertension 2007   DR [facility]   ON MED  . Hypertensive disorder 09/05/2011   Dr Jacklynn Lewis, on procardia 30mg  XL  . Hypokalemia 09/19/2017  . 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 Metrorrhagia 04/11/2017  . Numbness and tingling in left arm 04/15/2015  . Ocular migraine 08/26/2018  . Pain in left upper arm 01/31/2019  . Palpitations 07/15/2018  . Pelvic adhesions 06/06/2011  . Pneumonia AGE 63  . Polymenorrhea 04/11/2017  . Preoperative cardiovascular examination 07/15/2018  . Status post repeat low transverse cesarean section 01/29/2012  . Umbilical hernia without obstruction and without gangrene 04/15/2015  . Uterine leiomyoma 09/05/2011    Past Surgical History:   Procedure Laterality Date  . BREAST CYST EXCISION  02/22/2011   Procedure: CYST EXCISION BREAST;  Surgeon: Haywood Lasso, MD;  Location: [facility];  Service: General;  Laterality: Left;  Removal Left Breast Mass  . CESAREAN SECTION    . CESAREAN SECTION WITH BILATERAL TUBAL LIGATION  01/28/2012   Procedure: CESAREAN SECTION WITH BILATERAL TUBAL LIGATION;  Surgeon: Alwyn Pea, MD;  Location: [facility];  Service: Obstetrics;  Laterality: Bilateral;  Repeat C/S - POSSIBLE BTL  . DILATION AND CURETTAGE OF UTERUS    . DILITATION & CURRETTAGE/HYSTROSCOPY WITH HYDROTHERMAL ABLATION N/A 07/17/2018   Procedure: DILATATION & CURETTAGE/HYSTEROSCOPY WITH FAILED HYDROTHERMAL ABLATION;  Surgeon: Delsa Bern, MD;  Location: [place];  Service: Gynecology;  Laterality: N/A;  . HYSTEROSCOPY WITH NOVASURE N/A 07/17/2018   Procedure: HYSTEROSCOPY WITH NOVASURE;  Surgeon: Delsa Bern, MD;  Location: [place];  Service: Gynecology;  Laterality: N/A;  . salpyngooophorectomy     LSO 2005 for [facility] Medications:  Home Meds:  Current Meds  Medication Sig  . acetaminophen (TYLENOL) 500 MG tablet Take 1 tablet (500 mg total) by mouth every 6 (six) hours as needed. (Patient taking differently: Take 500 mg by mouth every 6 (six) hours as needed for moderate pain or headache. )  . fluticasone (FLONASE) 50 MCG/ACT nasal spray Place 1 spray into both nostrils daily as needed for allergies.   . montelukast (SINGULAIR) 10 MG tablet Take 10 mg by mouth daily.   Marland Kitchen NIFEdipine (PROCARDIA-XL/ADALAT-CC/NIFEDICAL-XL) 30 MG 24 hr tablet  Take 30 mg by mouth daily.   Marland Kitchen SLYND 4 MG TABS Take 4 mg by mouth daily.   Marland Kitchen triamterene-hydrochlorothiazide (MAXZIDE-25) 37.5-25 MG tablet Take 0.5 tablets by mouth daily.     Scheduled Meds: . [START ON 01/14/2020] feeding supplement  296 mL Oral Once  . feeding supplement  592 mL Oral Once  . scopolamine  1 patch Transdermal On Call to  OR   Continuous Infusions: . clindamycin    . clindamycin (CLEOCIN) IV     And  . gentamicin    . lactated ringers 10 mL/hr at 01/13/20 1230  . lactated ringers     PRN Meds:.  Allergies:  Allergies  Allergen Reactions  . Bee Venom Hives and Swelling  . Penicillins Hives and Swelling    Documented treatment with amoxicillin in 2008 without difficulty    Family History  Problem Relation Age of Onset  . Heart disease Mother   . Diabetes Father   . Heart disease Father        DIED AT 30  . Heart disease Maternal Grandfather   . Diabetes Maternal Grandfather   . Lupus Sister   . Hypertension Maternal Grandmother   . Stroke Maternal Grandmother   . Asthma Daughter     Social History:  reports that she has never smoked. She has never used smokeless tobacco. She reports current alcohol use. She reports that she does not use drugs.  ROS: A complete review of systems was performed.  All systems are negative except for pertinent findings as noted.  Physical Exam:  Vital signs in last 24 hours: Temp:  [97.9 F (36.6 C)] 97.9 F (36.6 C) (12/01 1151) Pulse Rate:  [81] 81 (12/01 1151) Resp:  [18] 18 (12/01 1151) BP: (136)/(72) 136/72 (12/01 1151) SpO2:  [99 %] 99 % (12/01 1151) Weight:  [88.1 kg] 88.1 kg (12/01 1151) Constitutional:  Alert and oriented, No acute distress Cardiovascular: Regular rate and rhythm, No JVD Respiratory: Normal respiratory effort, Lungs clear bilaterally GI: Abdomen is soft, nontender, nondistended, no abdominal masses GU: No CVA tenderness Lymphatic: No lymphadenopathy Neurologic: Grossly intact, no focal deficits Psychiatric: Normal mood and affect  Laboratory Data:  No results for input(s): WBC, HGB, HCT, PLT in the last 72 hours.  No results for input(s): NA, K, CL, GLUCOSE, BUN, CALCIUM, CREATININE in the last 72 hours.  Invalid input(s): CO3   Results for orders placed or performed during the hospital encounter of 01/13/20 (from  the past 24 hour(s))  Pregnancy, urine POC     Status: None   Collection Time: 01/13/20 12:13 PM  Result Value Ref Range   Preg Test, Ur NEGATIVE NEGATIVE  SARS Coronavirus 2 by RT PCR (hospital order, performed in [place] hospital lab) Nasopharyngeal Nasopharyngeal Swab     Status: None   Collection Time: 01/13/20 12:16 PM   Specimen: Nasopharyngeal Swab  Result Value Ref Range   SARS Coronavirus 2 NEGATIVE NEGATIVE   Recent Results (from the past 240 hour(s))  SARS CORONAVIRUS 2 (TAT 6-24 HRS) Nasopharyngeal Nasopharyngeal Swab     Status: None   Collection Time: 01/11/20  3:28 PM   Specimen: Nasopharyngeal Swab  Result Value Ref Range Status   SARS Coronavirus 2 NEGATIVE NEGATIVE Final    Comment: (NOTE) SARS-CoV-2 target nucleic acids are NOT DETECTED.  The SARS-CoV-2 RNA is generally detectable in upper and lower respiratory specimens during the acute phase of infection. Negative results do not preclude SARS-CoV-2 infection, do not rule  out co-infections with other pathogens, and should not be used as the sole basis for treatment or other patient management decisions. Negative results must be combined with clinical observations, patient history, and epidemiological information. The expected result is Negative.  Fact Sheet for Patients: SugarRoll.be  Fact Sheet for Healthcare Providers: https://www.woods-mathews.com/  This test is not yet approved or cleared by the [place] FDA and  has been authorized for detection and/or diagnosis of SARS-CoV-2 by FDA under an Emergency Use Authorization (EUA). This EUA will remain  in effect (meaning this test can be used) for the duration of the COVID-19 declaration under Se ction 564(b)(1) of the Act, 21 U.S.C. section 360bbb-3(b)(1), unless the authorization is terminated or revoked sooner.  Performed at [facility], [place] [address], [place], [place] [address]   SARS  Coronavirus 2 by RT PCR (hospital order, performed in [facility] hospital lab) Nasopharyngeal Nasopharyngeal Swab     Status: None   Collection Time: 01/13/20 12:16 PM   Specimen: Nasopharyngeal Swab  Result Value Ref Range Status   SARS Coronavirus 2 NEGATIVE NEGATIVE Final    Comment: (NOTE) SARS-CoV-2 target nucleic acids are NOT DETECTED.  The SARS-CoV-2 RNA is generally detectable in upper and lower respiratory specimens during the acute phase of infection. The lowest concentration of SARS-CoV-2 viral copies this assay can detect is 250 copies / mL. A negative result does not preclude SARS-CoV-2 infection and should not be used as the sole basis for treatment or other patient management decisions.  A negative result may occur with improper specimen collection / handling, submission of specimen other than nasopharyngeal swab, presence of viral mutation(s) within the areas targeted by this assay, and inadequate number of viral copies (<250 copies / mL). A negative result must be combined with clinical observations, patient history, and epidemiological information.  Fact Sheet for Patients:   StrictlyIdeas.no  Fact Sheet for Healthcare Providers: BankingDealers.co.za  This test is not yet approved or  cleared by the [place] FDA and has been authorized for detection and/or diagnosis of SARS-CoV-2 by FDA under an Emergency Use Authorization (EUA).  This EUA will remain in effect (meaning this test can be used) for the duration of the COVID-19 declaration under Section 564(b)(1) of the Act, 21 U.S.C. section 360bbb-3(b)(1), unless the authorization is terminated or revoked sooner.  Performed at [facility], [place] [address], [place], [place] [address]     Renal Function: No results for input(s): CREATININE in the last 168 hours. Estimated Creatinine Clearance: 81.9 mL/min (by C-G formula based on SCr of 0.99  mg/dL).  Radiologic Imaging: No results found.  I independently reviewed the above imaging studies.  Impression/Recommendation 44 year old female with an enlarged uterus and history of tubo-ovarian abscess requiring robotic hysterectomy with Dr. Cletis Media.  -The risks, benefits and alternatives of cystoscopy with ureteral stent placement was discussed with the patient.  Risks include, but are not limited to: bleeding, urinary tract infection, ureteral injury, ureteral stricture disease, chronic pain, urinary symptoms, bladder injury, stent migration, the need for nephrostomy tube placement, MI, CVA, DVT, PE and the inherent risks with general anesthesia.  The patient voices understanding and wishes to proceed.   Ellison Hughs, MD [facility] 01/13/2020, 1:53 PM

## 2020-01-13 NOTE — OR Nursing (Signed)
Pt ambulated from stretcher to bed Dressing CDI

## 2020-01-14 ENCOUNTER — Encounter (HOSPITAL_COMMUNITY): Payer: Self-pay | Admitting: Obstetrics and Gynecology

## 2020-01-14 LAB — BASIC METABOLIC PANEL
Anion gap: 11 (ref 5–15)
BUN: 6 mg/dL (ref 6–20)
CO2: 23 mmol/L (ref 22–32)
Calcium: 8.7 mg/dL — ABNORMAL LOW (ref 8.9–10.3)
Chloride: 99 mmol/L (ref 98–111)
Creatinine, Ser: 1.04 mg/dL — ABNORMAL HIGH (ref 0.44–1.00)
GFR, Estimated: >60 mL/min (ref >60)
Glucose, Bld: 144 mg/dL — ABNORMAL HIGH (ref 70–99)
Potassium: 3.5 mmol/L (ref 3.5–5.1)
Sodium: 133 mmol/L — ABNORMAL LOW (ref 135–145)

## 2020-01-14 LAB — CBC
HCT: 32.6 % — ABNORMAL LOW (ref 36.0–46.0)
Hemoglobin: 10 g/dL — ABNORMAL LOW (ref 12.0–15.0)
MCH: 24.7 pg — ABNORMAL LOW (ref 26.0–34.0)
MCHC: 30.7 g/dL (ref 30.0–36.0)
MCV: 80.5 fL (ref 80.0–100.0)
Platelets: 280 10*3/uL (ref 150–400)
RBC: 4.05 MIL/uL (ref 3.87–5.11)
RDW: 13.4 % (ref 11.5–15.5)
WBC: 12.3 10*3/uL — ABNORMAL HIGH (ref 4.0–10.5)
nRBC: 0 % (ref 0.0–0.2)

## 2020-01-14 MED ORDER — INFLUENZA VAC SPLIT QUAD 0.5 ML IM SUSY
0.5000 mL | PREFILLED_SYRINGE | INTRAMUSCULAR | Status: DC
  Filled 2020-01-14: qty 0.5

## 2020-01-14 MED ORDER — OXYCODONE HCL 5 MG PO TABS
10.0000 mg | ORAL_TABLET | ORAL | Status: DC | PRN
  Administered 2020-01-15 (×2): 10 mg via ORAL
  Filled 2020-01-14 (×2): qty 2

## 2020-01-14 MED ORDER — IBUPROFEN 600 MG PO TABS
600.0000 mg | ORAL_TABLET | Freq: Four times a day (QID) | ORAL | Status: DC
  Administered 2020-01-14 – 2020-01-15 (×4): 600 mg via ORAL
  Filled 2020-01-14 (×4): qty 1

## 2020-01-14 NOTE — Plan of Care (Signed)
  Problem: Education: Goal: Knowledge of General Education information will improve Description Including pain rating scale, medication(s)/side effects and non-pharmacologic comfort measures Outcome: Progressing   

## 2020-01-14 NOTE — Progress Notes (Addendum)
Terry Bryant is a56 y.o.  [id number]  Post Op Date #1:  Diagnostic Laparoscopy; [facility].RS/LOA/Placement of Ureteral Stents  Subjective: Patient is Doing well postoperatively. Patient has Pain is controlled with current analgesics. Medications being used: acetaminophen, prescription NSAID's including Ketorolac 30 mg IV and narcotic analgesics including Dilaudid and Roxicodone. Ambulating without difficulty, tolerating po but has not voided yet.   Objective: Vital signs in last 24 hours: Temp:  [97.7 F (36.5 C)-98.6 F (37 C)] 98.6 F (37 C) (12/02 0628) Pulse Rate:  [61-81] 74 (12/02 0628) Resp:  [14-19] 17 (12/02 0628) BP: (104-136)/(71-94) 114/80 (12/02 0628) SpO2:  [97 %-100 %] 100 % (12/02 0628) Weight:  [88.1 kg] 88.1 kg (12/01 1151)  Intake/Output from previous day: 12/01 0701 - 12/02 0700 In: 2949.8 [P.O.:540; I.V.:2299.1] Out: 1745 [Urine:1545] Intake/Output this shift: No intake/output data recorded. Recent Labs  Lab 01/14/20 0321  WBC 12.3*  HGB 10.0*  HCT 32.6*  PLT 280     Recent Labs  Lab 01/14/20 0321  NA 133*  K 3.5  CL 99  CO2 23  BUN 6  CREATININE 1.04*  CALCIUM 8.7*  GLUCOSE 144*    EXAM: General: alert, cooperative and no distress Resp: clear to auscultation bilaterally Cardio: regular rate and rhythm, S1, S2 normal, no murmur, click, rub or gallop GI: bowel sounds present;  dressing clean/dry/intact Extremities: Homans sign is negative, no sign of DVT and SCD hose on and functioning-no calf tenderness.   Assessment: s/p Procedure(s): ABDOMINAL SUPRACERVICAL HYSTERECTOMY with RIGHT SALPINGECTOMY APPLICATION OF CELL SAVER CYSTOSCOPY WITH STENT PLACEMENT LAPAROSCOPY DIAGNOSTIC: stable, progressing well and anemia  Plan: Advance diet Encourage ambulation Increase hydration  Routine care  LOS: 1 day    Earnstine Regal, PA-C 01/14/2020 7:21 AM   Seen and agreed Pain well controlled  Voiding without difficulty or dysuria but  reports urine is still red Ambulating and tolerating full diet Op findings reviewed. Questions answered. Possible discharge home tomorrow pm Repeat BMP in am

## 2020-01-15 LAB — COMPREHENSIVE METABOLIC PANEL
ALT: 14 U/L (ref 0–44)
AST: 15 U/L (ref 15–41)
Albumin: 3 g/dL — ABNORMAL LOW (ref 3.5–5.0)
Alkaline Phosphatase: 35 U/L — ABNORMAL LOW (ref 38–126)
Anion gap: 10 (ref 5–15)
BUN: 6 mg/dL (ref 6–20)
CO2: 26 mmol/L (ref 22–32)
Calcium: 8.6 mg/dL — ABNORMAL LOW (ref 8.9–10.3)
Chloride: 101 mmol/L (ref 98–111)
Creatinine, Ser: 0.89 mg/dL (ref 0.44–1.00)
GFR, Estimated: >60 mL/min (ref >60)
Glucose, Bld: 109 mg/dL — ABNORMAL HIGH (ref 70–99)
Potassium: 3 mmol/L — ABNORMAL LOW (ref 3.5–5.1)
Sodium: 137 mmol/L (ref 135–145)
Total Bilirubin: 0.5 mg/dL (ref 0.3–1.2)
Total Protein: 5.6 g/dL — ABNORMAL LOW (ref 6.5–8.1)

## 2020-01-15 LAB — CBC
HCT: 28.4 % — ABNORMAL LOW (ref 36.0–46.0)
Hemoglobin: 9.2 g/dL — ABNORMAL LOW (ref 12.0–15.0)
MCH: 25.6 pg — ABNORMAL LOW (ref 26.0–34.0)
MCHC: 32.4 g/dL (ref 30.0–36.0)
MCV: 79.1 fL — ABNORMAL LOW (ref 80.0–100.0)
Platelets: 243 10*3/uL (ref 150–400)
RBC: 3.59 MIL/uL — ABNORMAL LOW (ref 3.87–5.11)
RDW: 13.6 % (ref 11.5–15.5)
WBC: 8.6 10*3/uL (ref 4.0–10.5)
nRBC: 0 % (ref 0.0–0.2)

## 2020-01-15 MED ORDER — HYDROCODONE BITARTRATE ER 10 MG PO CP12: ORAL_CAPSULE | ORAL | 0 refills | Status: DC

## 2020-01-15 MED ORDER — DIPHENHYDRAMINE HCL 25 MG PO CAPS
25.0000 mg | ORAL_CAPSULE | ORAL | Status: DC | PRN
  Administered 2020-01-15: 25 mg via ORAL
  Filled 2020-01-15: qty 1

## 2020-01-15 MED ORDER — ACETAMINOPHEN 500 MG PO TABS: ORAL_TABLET | ORAL | 0 refills | Status: DC

## 2020-01-15 MED ORDER — IBUPROFEN 600 MG PO TABS: ORAL_TABLET | ORAL | 1 refills | Status: DC

## 2020-01-15 NOTE — Progress Notes (Cosign Needed Addendum)
Terry Bryant is a43 y.o.  [id number]  Post Op Date # 2: Diagnostic Laparoscopy; Abdominal Supra-cervical Hysterectomy/Right  Salpingectomy and Placement of Ureteral Stents  Subjective: Patient is Doing well postoperatively. Patient has Pain is controlled with current analgesics. Medications being used: prescription NSAID's including Ketrolac 30 mg IV and narcotic analgesics including Roxicodone 10 mg however experienced some itching and mild rash; Tylenol 1000 mg also prescribed.. The patient is ambulating in the halls without dizziness, voiding without difficulty and tolerating a regular diet.   Objective: Vital signs in last 24 hours: Temp:  [97.6 F (36.4 C)-98.3 F (36.8 C)] 98.3 F (36.8 C) (12/03 0602) Pulse Rate:  [66-77] 77 (12/03 0602) Resp:  [18] 18 (12/03 0602) BP: (101-118)/(62-72) 113/67 (12/03 0602) SpO2:  [98 %-100 %] 98 % (12/03 0602)  Intake/Output from previous day: 12/02 0701 - 12/03 0700 In: 480 [P.O.:480] Out: -  Intake/Output this shift: No intake/output data recorded. Recent Labs  Lab 01/14/20 0321 01/15/20 0056  WBC 12.3* 8.6  HGB 10.0* 9.2*  HCT 32.6* 28.4*  PLT 280 243     Recent Labs  Lab 01/14/20 0321 01/15/20 0056  NA 133* 137  K 3.5 3.0*  CL 99 101  CO2 23 26  BUN 6 6  CREATININE 1.04* 0.89  CALCIUM 8.7* 8.6*  PROT  --  5.6*  BILITOT  --  0.5  ALKPHOS  --  35*  ALT  --  14  AST  --  15  GLUCOSE 144* 109*    EXAM: General: alert, cooperative and no distress Resp: clear to auscultation bilaterally Cardio: regular rate and rhythm, S1, S2 normal, no murmur, click, rub or gallop GI: soft, non-tender; bowel sounds normal; no masses,  no organomegaly Extremities: Homans sign is negative, no sign of DVT and no calf tenderness.   Assessment: s/p Procedure(s): ABDOMINAL SUPRACERVICAL HYSTERECTOMY with RIGHT SALPINGECTOMY APPLICATION OF CELL SAVER CYSTOSCOPY WITH STENT PLACEMENT LAPAROSCOPY DIAGNOSTIC: stable, progressing well,  tolerating diet and anemia  Plan: Discharge home  Urine culture  LOS: 2 days    Earnstine Regal, PA-C 01/15/2020 7:25 AM

## 2020-01-15 NOTE — Discharge Summary (Signed)
Physician Discharge Summary  Patient ID: Terry Bryant MRN: [id number] DOB/AGE: 1975/07/17 44 y.o.  Admit date: 01/13/2020 Discharge date: 01/15/2020   Discharge Diagnoses:  Active Problems:   Fibroids   Chronic pelvic pain in female   Operation: Diagnostic Laparoscopy, Abdominal Supra-cervical Hysterectomy, Lysis of Adhesions, Right Salpingectomy and Placement of Ureteral Stents   Discharged Condition: Good  Hospital Course: On the date of admission the patient underwent the aforementioned procedures and tolerated them well.  Post operative course was unremarkable with the patent resuming bowel and bladder function by post operative day #2 and was therefore deemed ready for discharge home. He discharge hemoglobin was 9.2.   Disposition: Discharge disposition: 01-Home or Self Care       Discharge Medications:  Allergies as of 01/15/2020      Reactions   Bee Venom Hives, Swelling   Penicillins Hives, Swelling   Documented treatment with amoxicillin in 2008 without difficulty      Medication List    STOP taking these medications   Slynd 4 MG Tabs Generic drug: Drospirenone     TAKE these medications   acetaminophen 500 MG tablet Commonly known as: TYLENOL take 2 tablets po every 6 hours for 3 days then as needed for post operative pain What changed:   how much to take  how to take this  when to take this  reasons to take this  additional instructions   fluticasone 50 MCG/ACT nasal spray Commonly known as: FLONASE Place 1 spray into both nostrils daily as needed for allergies.   gabapentin 100 MG capsule Commonly known as: NEURONTIN Take 100 mg by mouth daily as needed (pain).   HYDROcodone Bitartrate ER 10 MG Cp12 take 1 tablet po every 12 hours as needed for breakthrough post operative pain   ibuprofen 600 MG tablet Commonly known as: ADVIL take 1 tablet po pc every 6 hours for 5 days then as needed for post operative pain   montelukast 10 MG  tablet Commonly known as: SINGULAIR Take 10 mg by mouth daily.   NIFEdipine 30 MG 24 hr tablet Commonly known as: PROCARDIA-XL/NIFEDICAL-XL Take 30 mg by mouth daily.   omeprazole 20 MG capsule Commonly known as: PRILOSEC Take 1 capsule (20 mg total) by mouth daily.   triamterene-hydrochlorothiazide 37.5-25 MG tablet Commonly known as: MAXZIDE-25 Take 0.5 tablets by mouth daily.          Follow-up: Dr. Katharine Look A. Rivard in 2 weeks and again in 6 weeks    Signed: Earnstine Regal, PA-C 01/15/2020, 7:46 AM

## 2020-01-15 NOTE — Progress Notes (Signed)
Patient discharged to home with instructions. 

## 2020-01-15 NOTE — Progress Notes (Signed)
Patient complaining of itching all over body,no rash or redness noted,she said she might be having an allergic reaction from Oxycodone.Dr Cletis Media notified with new order.

## 2020-01-15 NOTE — Discharge Instructions (Signed)
Call [facility] @ [phone] if:  You have a temperature greater than or equal to 100.4 degrees Farenheit orally You have pain that is not made better by the pain medication given and taken as directed You have excessive bleeding or problems urinating  Take Colace (Docusate Sodium/Stool Softener) 100 mg 2-3 times daily while taking narcotic pain medicine to avoid constipation or until bowel movements are regular.  Take any over the counter iron supplement of your choice, daily for the next 6 months  Check you Blood Pressure twice a day and if your blood pressure is less than 115/80 do not take your blood pressure medication for that day  Take with food,  Ibuprofen 600 mg and Acetaminophen (Tylenol) 500 mg (2 tablets) every 6 hours for 3 days then as needed for pain  You may drive after 2 weeks You may walk up steps  You may shower  You may resume a regular diet  Keep incisions clean and dry-remove your abdominal honeycomb dressing on  January 20, 2020  Do not lift over 15 pounds for 6 weeks Avoid anything in vagina for 6 weeks (or until after your post-operative visit)

## 2020-01-16 LAB — URINE CULTURE: Culture: NO GROWTH

## 2020-01-18 LAB — SURGICAL PATHOLOGY

## 2020-01-19 MED FILL — Sodium Chloride IV Soln 0.9%: INTRAVENOUS | Qty: 1000 | Status: AC

## 2020-01-19 MED FILL — Heparin Sodium (Porcine) Inj 1000 Unit/ML: INTRAMUSCULAR | Qty: 30 | Status: AC

## 2020-10-24 ENCOUNTER — Emergency Department (HOSPITAL_BASED_OUTPATIENT_CLINIC_OR_DEPARTMENT_OTHER)
Admission: EM | Admit: 2020-10-24 | Discharge: 2020-10-25 | Disposition: A | Discharge status: Home / Self Care | Payer: BC Managed Care – PPO | Attending: Emergency Medicine | Admitting: Emergency Medicine

## 2020-10-24 ENCOUNTER — Emergency Department (HOSPITAL_BASED_OUTPATIENT_CLINIC_OR_DEPARTMENT_OTHER): Payer: BC Managed Care – PPO

## 2020-10-24 ENCOUNTER — Other Ambulatory Visit: Payer: Self-pay

## 2020-10-24 ENCOUNTER — Encounter (HOSPITAL_BASED_OUTPATIENT_CLINIC_OR_DEPARTMENT_OTHER): Payer: Self-pay

## 2020-10-24 DIAGNOSIS — I10 Essential (primary) hypertension: Secondary | ICD-10-CM | POA: Insufficient documentation

## 2020-10-24 DIAGNOSIS — R059 Cough, unspecified: Secondary | ICD-10-CM | POA: Insufficient documentation

## 2020-10-24 DIAGNOSIS — Z20822 Contact with and (suspected) exposure to covid-19: Secondary | ICD-10-CM | POA: Diagnosis not present

## 2020-10-24 DIAGNOSIS — D649 Anemia, unspecified: Secondary | ICD-10-CM | POA: Insufficient documentation

## 2020-10-24 DIAGNOSIS — R0602 Shortness of breath: Secondary | ICD-10-CM | POA: Insufficient documentation

## 2020-10-24 DIAGNOSIS — E876 Hypokalemia: Secondary | ICD-10-CM | POA: Insufficient documentation

## 2020-10-24 DIAGNOSIS — R0609 Other forms of dyspnea: Secondary | ICD-10-CM | POA: Diagnosis present

## 2020-10-24 DIAGNOSIS — R079 Chest pain, unspecified: Secondary | ICD-10-CM | POA: Insufficient documentation

## 2020-10-24 DIAGNOSIS — Z79899 Other long term (current) drug therapy: Secondary | ICD-10-CM | POA: Diagnosis not present

## 2020-10-24 LAB — CBC
HCT: 37.2 % (ref 36.0–46.0)
Hemoglobin: 11.6 g/dL — ABNORMAL LOW (ref 12.0–15.0)
MCH: 24.5 pg — ABNORMAL LOW (ref 26.0–34.0)
MCHC: 31.2 g/dL (ref 30.0–36.0)
MCV: 78.6 fL — ABNORMAL LOW (ref 80.0–100.0)
Platelets: 270 10*3/uL (ref 150–400)
RBC: 4.73 MIL/uL (ref 3.87–5.11)
RDW: 13.5 % (ref 11.5–15.5)
WBC: 5.8 10*3/uL (ref 4.0–10.5)
nRBC: 0 % (ref 0.0–0.2)

## 2020-10-24 LAB — TROPONIN I (HIGH SENSITIVITY): Troponin I (High Sensitivity): <2 ng/L (ref <18)

## 2020-10-24 LAB — BASIC METABOLIC PANEL
Anion gap: 8 (ref 5–15)
BUN: 13 mg/dL (ref 6–20)
CO2: 29 mmol/L (ref 22–32)
Calcium: 9.3 mg/dL (ref 8.9–10.3)
Chloride: 97 mmol/L — ABNORMAL LOW (ref 98–111)
Creatinine, Ser: 0.9 mg/dL (ref 0.44–1.00)
GFR, Estimated: >60 mL/min (ref >60)
Glucose, Bld: 111 mg/dL — ABNORMAL HIGH (ref 70–99)
Potassium: 3.4 mmol/L — ABNORMAL LOW (ref 3.5–5.1)
Sodium: 134 mmol/L — ABNORMAL LOW (ref 135–145)

## 2020-10-24 NOTE — ED Triage Notes (Signed)
Pt c/o SOS x 2 week-CP x 1 week-minimal cough x 3 days-denies fever/other flu like sx-NAD-steady gait

## 2020-10-25 ENCOUNTER — Emergency Department (HOSPITAL_BASED_OUTPATIENT_CLINIC_OR_DEPARTMENT_OTHER): Payer: BC Managed Care – PPO

## 2020-10-25 LAB — RESP PANEL BY RT-PCR (FLU A&B, COVID) ARPGX2
Influenza A by PCR: NEGATIVE
Influenza B by PCR: NEGATIVE
SARS Coronavirus 2 by RT PCR: NEGATIVE

## 2020-10-25 LAB — TROPONIN I (HIGH SENSITIVITY): Troponin I (High Sensitivity): <2 ng/L (ref <18)

## 2020-10-25 LAB — BRAIN NATRIURETIC PEPTIDE: B Natriuretic Peptide: 8.4 pg/mL (ref 0.0–100.0)

## 2020-10-25 LAB — D-DIMER, QUANTITATIVE: D-Dimer, Quant: 0.72 ug/mL-FEU — ABNORMAL HIGH (ref 0.00–0.50)

## 2020-10-25 MED ORDER — POTASSIUM CHLORIDE CRYS ER 20 MEQ PO TBCR
40.0000 meq | EXTENDED_RELEASE_TABLET | Freq: Once | ORAL | Status: AC
  Administered 2020-10-25: 40 meq via ORAL
  Filled 2020-10-25: qty 2

## 2020-10-25 MED ORDER — ALBUTEROL SULFATE HFA 108 (90 BASE) MCG/ACT IN AERS
2.0000 | INHALATION_SPRAY | Freq: Once | RESPIRATORY_TRACT | Status: AC
  Administered 2020-10-25: 2 via RESPIRATORY_TRACT
  Filled 2020-10-25: qty 6.7

## 2020-10-25 MED ORDER — IOHEXOL 350 MG/ML SOLN
75.0000 mL | Freq: Once | INTRAVENOUS | Status: AC | PRN
  Administered 2020-10-25: 75 mL via INTRAVENOUS

## 2020-10-25 NOTE — Discharge Instructions (Addendum)
Your evaluation here did not show any sign of any heart problems, any pneumonia, or blood clots in the lung.  The cause for your chest pain and shortness of breath is not clear.  Please follow-up with the pulmonologist for further outpatient evaluation.  Return to the emergency department if symptoms are getting worse.

## 2020-10-25 NOTE — ED Provider Notes (Signed)
[place] EMERGENCY DEPARTMENT Provider Note   CSN: [id number] Arrival date & time: 10/24/20  2140     History Chief Complaint  Patient presents with   Chest Pain    Terry Bryant is a 45 y.o. female.  The history is provided by the patient.  Chest Pain She has history of hypertension and comes in because of chest pain and shortness of breath.  She has been short of breath for about the last 3weeks, developed cough about a week ago.  Cough is nonproductive.  She developed chest pain 3 days ago which was initially dull, today it became a heavy feeling.  Nothing makes her chest heaviness better, nothing makes it worse.  Dyspnea is worse with exertion.  She denies nausea, vomiting, diaphoresis.  Cough is nonproductive.  She denies fever or chills.  She denies any sick contacts.  She has been vaccinated against COVID-19.  There is no history of diabetes, hyperlipidemia.  There is a family history of premature coronary atherosclerosis.  She denies history of exogenous estrogen use, recent surgery, recent travel.  She has been vaccinated against COVID-19, but has not received a booster vaccination.  She is a non-smoker.    Past Medical History:  Diagnosis Date   Abnormal electromyogram 04/15/2015   Allergic rhinitis 04/15/2015   Anemia    TAKES FE OCC   Atopic dermatitis 04/15/2015   Blood transfusion 02-16-11   '05 with ovary surgery   Blood transfusion without reported diagnosis    Carpal tunnel syndrome 04/15/2015   Cervical radiculopathy at C6 04/15/2015   Constipation 04/15/2015   Endometriosis 04/11/2017   Endometriosis of colon 2009   Enlarged uterus 10/26/2019   Fibroids 2004   GERD (gastroesophageal reflux disease) 04/15/2015   H/O cesarean section 07/17/2011   Pt had C/S then developed a left pelvic abscess and had ex lap with LSO. 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   Metrorrhagia 04/11/2017   Numbness and tingling in left arm 04/15/2015   Ocular migraine 08/26/2018   Pain in left upper arm 01/31/2019   Palpitations 07/15/2018   Pelvic adhesions 06/06/2011   Pneumonia AGE 25   Polymenorrhea 04/11/2017   Preoperative cardiovascular examination 07/15/2018   Status post repeat low transverse cesarean section [id number]   Umbilical hernia without obstruction and without gangrene 04/15/2015   Uterine leiomyoma 09/05/2011    Patient Active Problem List   Diagnosis Date Noted   Chronic pelvic pain in female 12/30/2019   Pneumonia    Hypertension    Blood transfusion without reported diagnosis    Enlarged uterus 10/26/2019   Pain in left upper arm 01/31/2019   Ocular migraine 08/26/2018   Preoperative cardiovascular examination 07/15/2018   Palpitations 07/15/2018   Hypokalemia 09/19/2017   Metrorrhagia 04/11/2017   Endometriosis 04/11/2017   Polymenorrhea 04/11/2017   History of iron deficiency anemia 06/05/2016   Abnormal electromyogram 04/15/2015   Allergic rhinitis 04/15/2015   Atopic dermatitis 04/15/2015   Carpal tunnel syndrome 04/15/2015   Cervical radiculopathy at C6 04/15/2015   Constipation 04/15/2015   GERD (gastroesophageal reflux disease) 04/15/2015   Numbness and tingling in left arm [id number]   Umbilical hernia without obstruction and without gangrene 04/15/2015   Status post  repeat low transverse cesarean section 01/29/2012   Hypertensive disorder 09/05/2011   Uterine leiomyoma 09/05/2011   Anemia 08/06/2011   H/O cesarean section 07/17/2011   Endometriosis of colon 06/06/2011   Pelvic adhesions 06/06/2011   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   Fibroids 2004    Past Surgical History:  Procedure Laterality Date   ABDOMINAL HYSTERECTOMY N/A 01/13/2020   Procedure: ABDOMINAL SUPRACERVICAL HYSTERECTOMY with RIGHT  SALPINGECTOMY;  Surgeon: Delsa Bern, MD;  Location: [place];  Service: Gynecology;  Laterality: N/A;   BREAST CYST EXCISION  02/22/2011   Procedure: CYST EXCISION BREAST;  Surgeon: Haywood Lasso, MD;  Location: [facility];  Service: General;  Laterality: Left;  Removal Left Breast Mass   CESAREAN SECTION     CESAREAN SECTION WITH BILATERAL TUBAL LIGATION  01/28/2012   Procedure: CESAREAN SECTION WITH BILATERAL TUBAL LIGATION;  Surgeon: Alwyn Pea, MD;  Location: [place] [facility];  Service: Obstetrics;  Laterality: Bilateral;  Repeat C/S - POSSIBLE BTL   CYSTOSCOPY WITH STENT PLACEMENT Bilateral 01/13/2020   Procedure: CYSTOSCOPY WITH STENT PLACEMENT;  Surgeon: Delsa Bern, MD;  Location: [place];  Service: Gynecology;  Laterality: Bilateral;  procedure 1   DILATION AND CURETTAGE OF UTERUS     DILITATION & CURRETTAGE/HYSTROSCOPY WITH HYDROTHERMAL ABLATION N/A 07/17/2018   Procedure: DILATATION & CURETTAGE/HYSTEROSCOPY WITH FAILED HYDROTHERMAL ABLATION;  Surgeon: Delsa Bern, MD;  Location: [place];  Service: Gynecology;  Laterality: N/A;   HYSTEROSCOPY WITH NOVASURE N/A 07/17/2018   Procedure: HYSTEROSCOPY WITH NOVASURE;  Surgeon: Delsa Bern, MD;  Location: [place];  Service: Gynecology;  Laterality: N/A;   LAPAROSCOPY N/A 01/13/2020   Procedure: LAPAROSCOPY DIAGNOSTIC;  Surgeon: Delsa Bern, MD;  Location: [place];  Service: Gynecology;  Laterality: N/A;   salpyngooophorectomy     LSO 2005 for TOA     OB History     Gravida  2   Para  2   Term  2   Preterm      AB      Living  2      SAB      IAB      Ectopic      Multiple      Live Births  2           Family History  Problem Relation Age of Onset   Heart disease Mother    Diabetes Father    Heart disease Father        DIED AT 74   Heart disease Maternal Grandfather    Diabetes Maternal Grandfather    Lupus Sister    Hypertension Maternal Grandmother    Stroke Maternal  Grandmother    Asthma Daughter     Social History   Tobacco Use   Smoking status: Never   Smokeless tobacco: Never  Vaping Use   Vaping Use: Never used  Substance Use Topics   Alcohol use: Yes    Comment: occ   Drug use: No    Home Medications Prior to Admission medications   Medication Sig Start Date End Date Taking? Authorizing Provider  acetaminophen (TYLENOL) 500 MG tablet take 2 tablets po every 6 hours for 3 days then as needed for post operative pain 01/15/20   Earnstine Regal, PA-C  fluticasone [facility]) 50 MCG/ACT nasal spray Place 1 spray into both nostrils daily as needed for allergies.     Joline Salt, RN  gabapentin (NEURONTIN) 100 MG capsule Take 100 mg by  mouth daily as needed (pain).     [provider]  HYDROcodone Bitartrate ER 10 MG CP12 take 1 tablet po every 12 hours as needed for breakthrough post operative pain 01/15/20   Earnstine Regal, PA-C  ibuprofen (ADVIL) 600 MG tablet take 1 tablet po pc every 6 hours for 5 days then as needed for post operative pain 01/15/20   Earnstine Regal, PA-C  montelukast (SINGULAIR) 10 MG tablet Take 10 mg by mouth daily.     Joline Salt, RN  NIFEdipine (PROCARDIA-XL/ADALAT-CC/NIFEDICAL-XL) 30 MG 24 hr tablet Take 30 mg by mouth daily.     [provider]  omeprazole (PRILOSEC) 20 MG capsule Take 1 capsule (20 mg total) by mouth daily. Patient not taking: Reported on 12/31/2019 10/26/19   Richardo Priest, MD  triamterene-hydrochlorothiazide (MAXZIDE-25) 37.5-25 MG tablet Take 0.5 tablets by mouth daily.     Joline Salt, RN    Allergies    Bee venom and Penicillins  Review of Systems   Review of Systems  Cardiovascular:  Positive for chest pain.  All other systems reviewed and are negative.  Physical Exam Updated Vital Signs BP (!) 139/94 (BP Location: Right Arm)   Pulse 65   Temp 98.4 F (36.9 C) (Oral)   Resp (!) 27   Ht '5\' 6"'$  (1.676 m)   Wt 90.7 kg   LMP 01/04/2020 (Exact Date)   SpO2  100%   BMI 32.28 kg/m   Physical Exam Vitals and nursing note reviewed.  45 year old female, resting comfortably and in no acute distress. Vital signs are significant for mildly elevated blood pressure and elevated respiratory rate. Oxygen saturation is 100%, which is normal. Head is normocephalic and atraumatic. PERRLA, EOMI. Oropharynx is clear. Neck is nontender and supple without adenopathy or JVD. Back is nontender and there is no CVA tenderness. Lungs are clear without rales, wheezes, or rhonchi. Chest is nontender. Heart has regular rate and rhythm without murmur. Abdomen is soft, flat, nontender without masses or hepatosplenomegaly and peristalsis is normoactive. Extremities have no cyanosis or edema, full range of motion is present. Skin is warm and dry without rash. Neurologic: Mental status is normal, cranial nerves are intact, there are no motor or sensory deficits.  ED Results / Procedures / Treatments   Labs (all labs ordered are listed, but only abnormal results are displayed) Labs Reviewed  BASIC METABOLIC PANEL - Abnormal; Notable for the following components:      Result Value   Sodium 134 (*)    Potassium 3.4 (*)    Chloride 97 (*)    Glucose, Bld 111 (*)    All other components within normal limits  CBC - Abnormal; Notable for the following components:   Hemoglobin 11.6 (*)    MCV 78.6 (*)    MCH 24.5 (*)    All other components within normal limits  TROPONIN I (HIGH SENSITIVITY)  TROPONIN I (HIGH SENSITIVITY)    EKG EKG Interpretation  Date/Time:  Monday October 24 2020 21:58:55 EDT Ventricular Rate:  82 PR Interval:  142 QRS Duration: 90 QT Interval:  362 QTC Calculation: 422 R Axis:   83 Text Interpretation: Normal sinus rhythm Nonspecific T wave abnormality Abnormal ECG When compared with ECG of 12/30/2018, No significant change was found Confirmed by Delora Fuel [id number]) on 10/25/2020 12:32:17 AM  Radiology CT Angio Chest PE W and/or Wo  Contrast  Result Date: 10/25/2020 CLINICAL DATA:  Shortness of breath, chest pain, cough EXAM:  CT ANGIOGRAPHY CHEST WITH CONTRAST TECHNIQUE: Multidetector CT imaging of the chest was performed using the standard protocol during bolus administration of intravenous contrast. Multiplanar CT image reconstructions and MIPs were obtained to evaluate the vascular anatomy. CONTRAST:  56m OMNIPAQUE IOHEXOL 350 MG/ML SOLN COMPARISON:  Chest radiograph dated 10/24/2020. CTA chest dated 11/01/2012. FINDINGS: Cardiovascular: Satisfactory opacification of the bilateral pulmonary arteries to the lobar level. No evidence of pulmonary embolism. Although not tailored for evaluation of the thoracic aorta, there is no evidence of thoracic aortic aneurysm or dissection. The heart is normal in size.  No pericardial effusion. Mediastinum/Nodes: No suspicious mediastinal lymphadenopathy. Visualized thyroid is unremarkable. Lungs/Pleura: Lungs are essentially clear. Mild ground-glass opacity/mosaic attenuation in the bilateral lower lobes. No focal consolidation. No suspicious pulmonary nodules. No pleural effusion or pneumothorax. Upper Abdomen: Visualized upper abdomen is grossly unremarkable. Musculoskeletal: Visualized osseous structures are within normal limits. Review of the MIP images confirms the above findings. IMPRESSION: No evidence of pulmonary embolism. Negative CT chest. Electronically Signed   By: SJulian HyM.D.   On: 10/25/2020 02:19   DG Chest Portable 1 View  Result Date: 10/24/2020 CLINICAL DATA:  Chest pain and shortness of breath EXAM: PORTABLE CHEST 1 VIEW COMPARISON:  12/30/2018 FINDINGS: The heart size and mediastinal contours are within normal limits. Both lungs are clear. The visualized skeletal structures are unremarkable. IMPRESSION: No active disease. Electronically Signed   By: KUlyses JarredM.D.   On: 10/24/2020 22:54    Procedures Procedures   Medications Ordered in ED Medications   potassium chloride SA (KLOR-CON) CR tablet 40 mEq (has no administration in time range)  albuterol (VENTOLIN HFA) 108 (90 Base) MCG/ACT inhaler 2 puff (has no administration in time range)    ED Course  I have reviewed the triage vital signs and the nursing notes.  Pertinent labs & imaging results that were available during my care of the patient were reviewed by me and considered in my medical decision making (see chart for details).   MDM Rules/Calculators/A&P                         Nonspecific chest pain, cough, dyspnea.  Etiology is unclear.  ECG is unchanged from prior.  Chest x-ray shows no acute process.  Initial troponin is normal with repeat troponin pending.  Mild anemia is present which is actually improved over baseline.  Mild hyponatremia is present which is not felt to be clinically significant.  She has mild hypokalemia and is given a dose of oral potassium.  Old records were reviewed, and she has been evaluated by cardiology for atypical chest pain and had a coronary CT scan 2 years ago which showed no evidence of coronary artery disease and calcium score was 0.  This is very unlikely to be ACS.  Discomfort has been present constantly for over 8 hours with no ECG changes and negative troponin.  Will screen for occult heart failure with BNP, will screen for pulmonary embolism with D-dimer.  We will also give therapeutic trial of albuterol  Repeat troponin and BNP are normal.  D-dimer is elevated, she will be sent for CT angiogram of the chest.  There was no improvement with a dose of albuterol inhaler.  CT angiogram showed no evidence of pulmonary embolism.  Cause for patient's symptoms are not clear.  However, she continues to have normal vital signs including excellent oxygen saturation.  She is referred to pulmonology for further outpatient work-up.  Final Clinical Impression(s) / ED Diagnoses Final diagnoses:  Nonspecific chest pain  Shortness of breath    Rx / DC  Orders ED Discharge Orders     None        Delora Fuel, MD [id number] [phone]

## 2021-02-09 ENCOUNTER — Institutional Professional Consult (permissible substitution): Payer: BC Managed Care – PPO | Admitting: Pulmonary Disease

## 2021-02-16 ENCOUNTER — Telehealth: Payer: Self-pay | Admitting: *Deleted

## 2021-02-16 NOTE — Telephone Encounter (Signed)
Spoke with the patient and scheduled a new patient appt with Dr Berline Lopes on 1/13 at 10:30 am. Patient given an arrival time of 10 am. Patient given the address and phone number for the clinic along with the policy for mask and visitors

## 2021-02-21 ENCOUNTER — Encounter: Payer: Self-pay | Admitting: Gynecologic Oncology

## 2021-02-24 ENCOUNTER — Encounter: Payer: BC Managed Care – PPO | Admitting: Gynecologic Oncology

## 2021-02-24 ENCOUNTER — Other Ambulatory Visit: Payer: Self-pay

## 2021-02-24 ENCOUNTER — Inpatient Hospital Stay: Payer: BC Managed Care – PPO | Attending: Gynecologic Oncology | Admitting: Gynecologic Oncology

## 2021-02-24 ENCOUNTER — Encounter: Payer: Self-pay | Admitting: Gynecologic Oncology

## 2021-02-24 VITALS — BP 128/71 | HR 76 | Temp 97.0°F | Resp 16 | Ht 66.0 in | Wt 206.0 lb

## 2021-02-24 DIAGNOSIS — Z6833 Body mass index (BMI) 33.0-33.9, adult: Secondary | ICD-10-CM

## 2021-02-24 DIAGNOSIS — M5412 Radiculopathy, cervical region: Secondary | ICD-10-CM | POA: Diagnosis not present

## 2021-02-24 DIAGNOSIS — Z90710 Acquired absence of both cervix and uterus: Secondary | ICD-10-CM | POA: Diagnosis not present

## 2021-02-24 DIAGNOSIS — N736 Female pelvic peritoneal adhesions (postinfective): Secondary | ICD-10-CM | POA: Insufficient documentation

## 2021-02-24 DIAGNOSIS — I10 Essential (primary) hypertension: Secondary | ICD-10-CM | POA: Insufficient documentation

## 2021-02-24 DIAGNOSIS — Z90721 Acquired absence of ovaries, unilateral: Secondary | ICD-10-CM | POA: Insufficient documentation

## 2021-02-24 DIAGNOSIS — N809 Endometriosis, unspecified: Secondary | ICD-10-CM | POA: Insufficient documentation

## 2021-02-24 DIAGNOSIS — K219 Gastro-esophageal reflux disease without esophagitis: Secondary | ICD-10-CM | POA: Diagnosis not present

## 2021-02-24 DIAGNOSIS — R102 Pelvic and perineal pain: Secondary | ICD-10-CM | POA: Insufficient documentation

## 2021-02-24 DIAGNOSIS — N9489 Other specified conditions associated with female genital organs and menstrual cycle: Secondary | ICD-10-CM | POA: Insufficient documentation

## 2021-02-24 DIAGNOSIS — Z79899 Other long term (current) drug therapy: Secondary | ICD-10-CM | POA: Diagnosis not present

## 2021-02-24 DIAGNOSIS — R1900 Intra-abdominal and pelvic swelling, mass and lump, unspecified site: Secondary | ICD-10-CM | POA: Insufficient documentation

## 2021-02-24 DIAGNOSIS — N7093 Salpingitis and oophoritis, unspecified: Secondary | ICD-10-CM

## 2021-02-24 NOTE — Progress Notes (Addendum)
GYNECOLOGIC ONCOLOGY NEW PATIENT CONSULTATION   Patient Name: Terry Bryant  Patient Age: 46 y.o. Date of Service: 02/24/2021 Referring Provider: Dr. Dede Query Rivard  Primary Care Provider: Jefm Petty, MD Consulting Provider: Jeral Pinch, MD   Assessment/Plan:  Premenopausal patient with history of multiple abdominal surgeries, prior TOA, endometriosis and known pelvic adhesive disease with new complex and symptomatic adnexal mass.  I reviewed in detail with the patient imaging findings from recent CT as well as her MRI (I cannot see either imaging because they were performed outside of our system). We discussed findings that make the mass complex and raise the concern for the possibility of malignancy. We discussed other differential diagnoses given her history including endometrioma or TOA of the remaining ovary. The patient does not have systemic symptoms, but this was also true at the time of her diagnosis of TOA previously (was identified to be an endometrioma on frozen pathology).  I reviewed her normal CEA and CA-125. While reassuring, these are not diagnostic tools. We discussed  that up to 50% of patients with early ovarian cancer have a normal CA125.  The patient is luckily relatively asymptotic now. She is quite worried about the risks of surgery, given known adhesive disease. We discussed risks at the time of surgery. I think it would be reasonable to proceed with attempted robotic UO with plan for conversion to open as/if necessary.   Discussed plan to send mass for frozen section. If benign, no additional surgery would be indicated. If borderline tumor, removal of the right ovary will constitute completion surgery. In the setting of malignancy, additional staging procedures would be performed as indicated and if safe.   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 Although I suspect we will move towards definitive surgery sooner rather than  later, I think its reasonable to start with an MRI. If findings were very suggestive of a benign process, given the patient's lack of significant symptoms, I think it would be reasonable to proceed with close imaging surveillance and, if nothing changed, plan for surgery over the summer when school is out. We will tentatively pick a date for surgery after her MRI in the event that findings continue to raise some concern for malignancy.   A copy of this note was sent to the patient's referring provider.   65 minutes of total time was spent for this patient encounter, including preparation, face-to-face counseling with the patient and coordination of care, and documentation of the encounter.   Jeral Pinch, MD  Division of Gynecologic Oncology  Department of Obstetrics and Gynecology  [facility]  ___________________________________________  Chief Complaint: Chief Complaint  Patient presents with   Adnexal mass    History of Present Illness:  Terry Bryant is a 46 y.o. y.o. female who is seen in consultation at the request of Jefm Petty, MD for an evaluation of a complex cystic mass.  Patient surgical history is notable for prior left salpingooophorectomy in the setting of tubo-ovarian abscess.  This surgery was performed in 2005, with findings at the time of surgery includimg pelvic adhesions and left tubo-ovarian abscess.  This procedure required conversion from laparoscopy to ex lap.  Pathology from this surgery revealed tubo-ovarian adhesions, ovarian hemorrhagic cyst with features highly suspicious for endometrioma.  Portions of hemorrhagic soft tissue were noted to be associated with fibrinopurulent exudate.  Then in December 2021, in the setting of uterine fibroids and abnormal  uterine bleeding with subsequent anemia, the patient underwent robotic converted to open surgery secondary to severe pelvic adhesions and obliteration of the anterior  cul-de-sac.  Ureteral stents were placed, a portion of the uterine serosa was left on the bladder, posterior cul-de-sac was noted to have colonic adhesions.  The patient underwent supracervical hysterectomy and right salpingectomy.  CT of the abdomen and pelvis with contrast performed on 12/9 shows a complex cystic mass within the right adnexa containing multiple internal septations and mural thickening as well as mild adjacent soft tissue stranding, measuring 9.6 x 6.6, new since previous study.  Differential diagnosis includes cystic ovarian neoplasm, tubo-ovarian abscess, and endometriosis.  Pelvic ultrasound exam performed at [facility] on 12/13 shows an 8.6 cm complex cystic mass with thick septations in the right adnexa.  Tumor markers were obtained in mid December and normal.  CEA was 3 and CA-125 was 31.4.  Today, the patient reports having an acute episode of abdominal pain 1 evening in mid December.  She thought this was related to having eaten out earlier in the day.  By the next day, her symptoms had improved significantly.  She was not able to get into her primary care physician and decided to go to urgent care.  An x-ray was obtained as well as a CT scan.  Given findings of an adnexal mass on CT scan, it was recommended that she see her OB/GYN.  Her OB/GYN had changed practices, but she ultimately saw Dr. Cletis Media in early January.  She notes now having some constant dull pelvic pain that she rates as a 2-3 out of 10.  This does not require treatment.  She also endorses some abdominal fullness.  She reports that appetite is okay, denies any nausea, emesis, early satiety.  Has baseline constipation, no changes recently.  Reports normal bladder function.  Denies any fevers or chills, other systemic symptoms of infection.  Since her hysterectomy, she has scant bleeding about once a month as if during her normal menstrual time.  This is reported to be blood that she sees 1 wipes after  voiding, does not require using a pad.  Patient works as a [employer].  Comes in with her husband today.  PAST MEDICAL HISTORY:  Past Medical History:  Diagnosis Date   Abnormal electromyogram 04/15/2015   Allergic rhinitis 04/15/2015   Anemia    TAKES FE OCC   Atopic dermatitis 04/15/2015   Blood transfusion 02-16-11   '05 with ovary surgery   Blood transfusion without reported diagnosis    Carpal tunnel syndrome 04/15/2015   Cervical radiculopathy at C6 04/15/2015   Constipation 04/15/2015   Endometriosis 04/11/2017   Endometriosis of colon 2009   Enlarged uterus 10/26/2019   Fibroids 2004   GERD (gastroesophageal reflux disease) 04/15/2015   H/O cesarean section 07/17/2011   Pt had C/S then developed a left pelvic abscess and had ex lap with LSO. 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   Metrorrhagia 04/11/2017   Numbness and tingling in left arm 04/15/2015   Ocular migraine 08/26/2018   Pain in left upper arm 01/31/2019   Palpitations 07/15/2018   Pelvic adhesions 06/06/2011   Pneumonia AGE 68   Polymenorrhea 04/11/2017   Preoperative cardiovascular  examination 07/15/2018   Status post repeat low transverse cesarean section 32/95/1884   Umbilical hernia without obstruction and without gangrene 04/15/2015   Uterine leiomyoma 09/05/2011     PAST SURGICAL HISTORY:  Past Surgical History:  Procedure Laterality Date   ABDOMINAL HYSTERECTOMY N/A 01/13/2020   Procedure: ABDOMINAL SUPRACERVICAL HYSTERECTOMY with RIGHT SALPINGECTOMY;  Surgeon: Delsa Bern, MD;  Location: [place];  Service: Gynecology;  Laterality: N/A;   BREAST CYST EXCISION  02/22/2011   Procedure: CYST EXCISION BREAST;  Surgeon: Haywood Lasso, MD;  Location: [facility];  Service: General;  Laterality: Left;  Removal  Left Breast Mass   CESAREAN SECTION     CESAREAN SECTION WITH BILATERAL TUBAL LIGATION  01/28/2012   Procedure: CESAREAN SECTION WITH BILATERAL TUBAL LIGATION;  Surgeon: Alwyn Pea, MD;  Location: [facility];  Service: Obstetrics;  Laterality: Bilateral;  Repeat C/S - POSSIBLE BTL   CYSTOSCOPY WITH STENT PLACEMENT Bilateral 01/13/2020   Procedure: CYSTOSCOPY WITH STENT PLACEMENT;  Surgeon: Delsa Bern, MD;  Location: [place];  Service: Gynecology;  Laterality: Bilateral;  procedure 1   DILATION AND CURETTAGE OF UTERUS     DILITATION & CURRETTAGE/HYSTROSCOPY WITH HYDROTHERMAL ABLATION N/A 07/17/2018   Procedure: DILATATION & CURETTAGE/HYSTEROSCOPY WITH FAILED HYDROTHERMAL ABLATION;  Surgeon: Delsa Bern, MD;  Location: [place];  Service: Gynecology;  Laterality: N/A;   HYSTEROSCOPY WITH NOVASURE N/A 07/17/2018   Procedure: HYSTEROSCOPY WITH NOVASURE;  Surgeon: Delsa Bern, MD;  Location: [place];  Service: Gynecology;  Laterality: N/A;   LAPAROSCOPY N/A 01/13/2020   Procedure: LAPAROSCOPY DIAGNOSTIC;  Surgeon: Delsa Bern, MD;  Location: [place];  Service: Gynecology;  Laterality: N/A;   salpyngooophorectomy     LSO 2005 for TOA    OB/GYN HISTORY:  OB History  Gravida Para Term Preterm AB Living  2 2 2     2   SAB IAB Ectopic Multiple Live Births          2    # Outcome Date GA Lbr Len/2nd Weight Sex Delivery Anes PTL Lv  2 Term 01/28/12 [redacted]w[redacted]d   M CS-LTranv Spinal  LIV  1 Term 03/19/03 [redacted]w[redacted]d 05:00 5 lb 5 oz (2.41 kg) F CS-LTranv EPI  LIV     Birth Comments: FETAL DISTRESS; PTL  12/2002    Patient's last menstrual period was 01/04/2020 (exact date).  Age at menarche: 64 Age at menopause: Not applicable Hx of HRT: Not applicable Hx of STDs: Denies Last pap: 2019 History of abnormal pap smears: Denies  SCREENING STUDIES:  Last mammogram: 09/2020  Last colonoscopy: Has never had  MEDICATIONS: Outpatient Encounter Medications as of 02/24/2021   Medication Sig   acetaminophen (TYLENOL) 500 MG tablet take 2 tablets po every 6 hours for 3 days then as needed for post operative pain   fluticasone (FLONASE) 50 MCG/ACT nasal spray Place 1 spray into both nostrils daily as needed for allergies.    gabapentin (NEURONTIN) 100 MG capsule Take 100 mg by mouth daily as needed (pain).    HYDROcodone Bitartrate ER 10 MG CP12 take 1 tablet po every 12 hours as needed for breakthrough post operative pain   montelukast (SINGULAIR) 10 MG tablet Take 10 mg by mouth daily.    triamterene-hydrochlorothiazide (MAXZIDE-25) 37.5-25 MG tablet Take 0.5 tablets by mouth daily.    ibuprofen (ADVIL) 600 MG tablet take 1 tablet po pc every 6 hours for 5 days then as needed for post operative pain (Patient not taking: Reported on  02/21/2021)   NIFEdipine (PROCARDIA-XL/ADALAT-CC/NIFEDICAL-XL) 30 MG 24 hr tablet Take 30 mg by mouth daily.  (Patient not taking: Reported on 02/21/2021)   omeprazole (PRILOSEC) 20 MG capsule Take 1 capsule (20 mg total) by mouth daily. (Patient not taking: Reported on 12/31/2019)   No facility-administered encounter medications on file as of 02/24/2021.    ALLERGIES:  Allergies  Allergen Reactions   Bee Venom Hives and Swelling   Penicillins Hives and Swelling    Documented treatment with amoxicillin in 2008 without difficulty     FAMILY HISTORY:  Family History  Problem Relation Age of Onset   Heart disease Mother    Diabetes Father    Heart disease Father        DIED AT 67   Lupus Sister    Uterine cancer Maternal Aunt    Hypertension Maternal Grandmother    Stroke Maternal Grandmother    Heart disease Maternal Grandfather    Diabetes Maternal Grandfather    Asthma Daughter    Colon cancer Neg Hx    Breast cancer Neg Hx    Ovarian cancer Neg Hx    Endometrial cancer Neg Hx    Pancreatic cancer Neg Hx    Prostate cancer Neg Hx      SOCIAL HISTORY:  Social Connections: Not on file    REVIEW OF SYSTEMS:  Denies  appetite changes, fevers, chills, fatigue, unexplained weight changes. Denies hearing loss, neck lumps or masses, mouth sores, ringing in ears or voice changes. Denies cough or wheezing.  Denies shortness of breath. Denies chest pain or palpitations. Denies leg swelling. Denies abdominal distention, pain, blood in stools, constipation, diarrhea, nausea, vomiting, or early satiety. Denies pain with intercourse, dysuria, frequency, hematuria or incontinence. Denies hot flashes, pelvic pain, vaginal bleeding or vaginal discharge.   Denies joint pain, back pain or muscle pain/cramps. Denies itching, rash, or wounds. Denies dizziness, headaches, numbness or seizures. Denies swollen lymph nodes or glands, denies easy bruising or bleeding. Denies anxiety, depression, confusion, or decreased concentration.  Physical Exam:  Vital Signs for this encounter:  Blood pressure 128/71, pulse 76, temperature (!) 97 F (36.1 C), temperature source Tympanic, resp. rate 16, height 5\' 6"  (1.676 m), weight 206 lb (93.4 kg), last menstrual period 01/04/2020, SpO2 100 %. Body mass index is 33.25 kg/m. General: Alert, oriented, no acute distress.  HEENT: Normocephalic, atraumatic. Sclera anicteric.  Chest: Clear to auscultation bilaterally. No wheezes, rhonchi, or rales. Cardiovascular: Regular rate and rhythm, no murmurs, rubs, or gallops.  Abdomen: Normoactive bowel sounds. Soft, nondistended, nontender to palpation. No masses or hepatosplenomegaly appreciated. No palpable fluid wave.  Well-healed laparoscopic and laparotomy incisions. Extremities: Grossly normal range of motion. Warm, well perfused. No edema bilaterally.  Skin: No rashes or lesions.  Lymphatics: No cervical, supraclavicular, or inguinal adenopathy.  GU:  Normal external female genitalia. No lesions. No discharge or bleeding.             Bladder/urethra:  No lesions or masses, well supported bladder             Vagina: Well rugated, no masses  or lesions noted.  No bleeding or discharge.             Cervix: Normal appearing, no lesions.  Cervix deviated posteriorly.             Uterus: Surgically absent.             Adnexa: Unable to palpate adnexal mass with my vaginal hand or  on rectovaginal exam.  There is some fullness in the right adnexa and midline, better appreciated with abdominal hand.  No nodularity.  Rectal: Mass not appreciated, no nodularity, rectum feels tethered to the cervix.  LABORATORY AND RADIOLOGIC DATA:  Outside medical records were reviewed to synthesize the above history, along with the history and physical obtained during the visit.   Lab Results  Component Value Date   WBC 5.8 10/24/2020   HGB 11.6 (L) 10/24/2020   HCT 37.2 10/24/2020   PLT 270 10/24/2020   GLUCOSE 111 (H) 10/24/2020   ALT 14 01/15/2020   AST 15 01/15/2020   NA 134 (L) 10/24/2020   K 3.4 (L) 10/24/2020   CL 97 (L) 10/24/2020   CREATININE 0.90 10/24/2020   BUN 13 10/24/2020   CO2 29 10/24/2020   INR 1.1 06/09/2018

## 2021-02-24 NOTE — Patient Instructions (Addendum)
Pleasure meeting you today.  We discussed 2 options in terms of moving forward to evaluate this complex mass near or on your right ovary.  Given your surgical history as well as history of endometriosis and abscess on the other ovary, we know that you do have significant scar tissue.  We discussed either proceeding with scheduling surgery now for removal of this ovary and mass versus getting an MRI.  An MRI will help delineate the mass and possible etiology better than the CT scan that you had.  From a symptom standpoint, it is good that you are not having significant symptoms related to the mass.  If things are overall of low concern on the MRI from a cancer standpoint, I think it would be reasonable to keep a close eye on the mass and delay surgery until the summer when you are off work.  If MRI findings are still concerning for possible cancer, then I think we need to move forward with scheduling surgery sooner rather than later.  We will tentatively schedule surgery on March 14, 2021. This can always be adjusted based on the MRI results. You may receive a phone call from the hospital as well to arrange for a pre-op appointment before surgery. We will make them aware that this would need to be after the MRI just in case the date changes and this appointment needs to change. We will also see you back in the office to discuss the surgery in detail if the decision is made to proceed based on the MRI on January 31.

## 2021-02-28 ENCOUNTER — Other Ambulatory Visit: Payer: Self-pay | Admitting: Gynecologic Oncology

## 2021-02-28 ENCOUNTER — Encounter: Payer: Self-pay | Admitting: Gynecologic Oncology

## 2021-02-28 DIAGNOSIS — N9489 Other specified conditions associated with female genital organs and menstrual cycle: Secondary | ICD-10-CM

## 2021-03-02 ENCOUNTER — Other Ambulatory Visit: Payer: Self-pay

## 2021-03-02 ENCOUNTER — Ambulatory Visit (HOSPITAL_COMMUNITY)
Admission: RE | Admit: 2021-03-02 | Discharge: 2021-03-02 | Disposition: A | Discharge status: Home / Self Care | Payer: BC Managed Care – PPO | Source: Ambulatory Visit | Attending: Gynecologic Oncology | Admitting: Gynecologic Oncology

## 2021-03-02 DIAGNOSIS — N9489 Other specified conditions associated with female genital organs and menstrual cycle: Secondary | ICD-10-CM | POA: Insufficient documentation

## 2021-03-02 MED ORDER — GADOBUTROL 1 MMOL/ML IV SOLN
9.0000 mL | Freq: Once | INTRAVENOUS | Status: AC | PRN
  Administered 2021-03-02: 9 mL via INTRAVENOUS

## 2021-03-02 NOTE — Progress Notes (Signed)
Spoke with the patient about MRI findings, overall very reassuring and most likely represent endometriomas. we discussed various options including proceeding with surgery now, delaying surgery until the summer and repeating imaging in 35 months, and starting endometriosis treatment in the interim. If cystic lesions were to decrease in size on treatment for endometriosis, it is possible that the patient would be able to avoid having sur gery altogether.  The patient would like to talk to her husband about treatment options and will call me back tomorrow.  Valarie Cones MD

## 2021-03-03 ENCOUNTER — Telehealth: Payer: Self-pay | Admitting: Gynecologic Oncology

## 2021-03-03 ENCOUNTER — Encounter: Payer: Self-pay | Admitting: Gynecologic Oncology

## 2021-03-03 ENCOUNTER — Inpatient Hospital Stay (HOSPITAL_BASED_OUTPATIENT_CLINIC_OR_DEPARTMENT_OTHER): Payer: BC Managed Care – PPO | Admitting: Gynecologic Oncology

## 2021-03-03 ENCOUNTER — Telehealth: Payer: Self-pay

## 2021-03-03 DIAGNOSIS — N809 Endometriosis, unspecified: Secondary | ICD-10-CM

## 2021-03-03 DIAGNOSIS — N9489 Other specified conditions associated with female genital organs and menstrual cycle: Secondary | ICD-10-CM | POA: Diagnosis not present

## 2021-03-03 DIAGNOSIS — N736 Female pelvic peritoneal adhesions (postinfective): Secondary | ICD-10-CM | POA: Diagnosis not present

## 2021-03-03 NOTE — Progress Notes (Signed)
Gynecologic Oncology Telehealth Consult Note: Gyn-Onc  I connected with Terry Bryant on 03/03/21 at  4:30 PM EST by telephone and verified that I am speaking with the correct person using two identifiers.  I discussed the limitations, risks, security and privacy concerns of performing an evaluation and management service by telemedicine and the availability of in-person appointments. I also discussed with the patient that there may be a patient responsible charge related to this service. The patient expressed understanding and agreed to proceed.  Other persons participating in the visit and their role in the encounter: None.  Patient's location: Home Provider's location: [facility]  Reason for Visit: Follow-up our discussion yesterday  Treatment History: Patient surgical history is notable for prior left salpingooophorectomy in the setting of tubo-ovarian abscess.  This surgery was performed in 2005, with findings at the time of surgery includimg pelvic adhesions and left tubo-ovarian abscess.  This procedure required conversion from laparoscopy to ex lap.  Pathology from this surgery revealed tubo-ovarian adhesions, ovarian hemorrhagic cyst with features highly suspicious for endometrioma.  Portions of hemorrhagic soft tissue were noted to be associated with fibrinopurulent exudate.  Then in December 2021, in the setting of uterine fibroids and abnormal uterine bleeding with subsequent anemia, the patient underwent robotic converted to open surgery secondary to severe pelvic adhesions and obliteration of the anterior cul-de-sac.  Ureteral stents were placed, a portion of the uterine serosa was left on the bladder, posterior cul-de-sac was noted to have colonic adhesions.  The patient underwent supracervical hysterectomy and right salpingectomy.   CT of the abdomen and pelvis with contrast performed on 12/9 shows a complex cystic mass within the right adnexa containing multiple  internal septations and mural thickening as well as mild adjacent soft tissue stranding, measuring 9.6 x 6.6, new since previous study.  Differential diagnosis includes cystic ovarian neoplasm, tubo-ovarian abscess, and endometriosis.  Pelvic ultrasound exam performed at [place] on 12/13 shows an 8.6 cm complex cystic mass with thick septations in the right adnexa.   Tumor markers were obtained in mid December and normal.  CEA was 3 and CA-125 was 31.4.   Today, the patient reports having an acute episode of abdominal pain 1 evening in mid December.  She thought this was related to having eaten out earlier in the day.  By the next day, her symptoms had improved significantly.  She was not able to get into her primary care physician and decided to go to urgent care.  An x-ray was obtained as well as a CT scan.  Given findings of an adnexal mass on CT scan, it was recommended that she see her OB/GYN.  Her OB/GYN had changed practices, but she ultimately saw Dr. Cletis Media in early January.  Interval History: After discussion yesterday, the patient would like to defer surgery at this time.  She is interested in endometriosis treatment.  Past Medical/Surgical History: Past Medical History:  Diagnosis Date   Abnormal electromyogram 04/15/2015   Adnexal mass    Allergic rhinitis 04/15/2015   Anemia    TAKES FE OCC   Atopic dermatitis 04/15/2015   Blood transfusion 02/16/2011   '05 with ovary surgery   Blood transfusion without reported diagnosis    Carpal tunnel syndrome 04/15/2015   Cervical radiculopathy at C6 04/15/2015   Constipation 04/15/2015   Endometriosis 04/11/2017   Endometriosis of colon 2009   Enlarged uterus 10/26/2019   Fibroids 2004   GERD (gastroesophageal reflux disease) 04/15/2015   H/O  cesarean section 07/17/2011   Pt had C/S then developed a left pelvic abscess and had ex lap with LSO. Undecided    History of iron deficiency anemia 06/05/2016   Hypertension     Hypertension 2007   DR 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   Metrorrhagia 04/11/2017   Numbness and tingling in left arm 04/15/2015   Ocular migraine 08/26/2018   Pain in left upper arm 01/31/2019   Palpitations 07/15/2018   Pelvic adhesions 06/06/2011   Pneumonia AGE 2   Polymenorrhea 04/11/2017   Preoperative cardiovascular examination 07/15/2018   Status post repeat low transverse cesarean section 94/08/6806   Umbilical hernia without obstruction and without gangrene 04/15/2015   Uterine leiomyoma 09/05/2011    Past Surgical History:  Procedure Laterality Date   ABDOMINAL HYSTERECTOMY N/A 01/13/2020   Procedure: ABDOMINAL SUPRACERVICAL HYSTERECTOMY with RIGHT SALPINGECTOMY;  Surgeon: Delsa Bern, MD;  Location: [place];  Service: Gynecology;  Laterality: N/A;   BREAST CYST EXCISION  02/22/2011   Procedure: CYST EXCISION BREAST;  Surgeon: Haywood Lasso, MD;  Location: [facility];  Service: General;  Laterality: Left;  Removal Left Breast Mass   CESAREAN SECTION     CESAREAN SECTION WITH BILATERAL TUBAL LIGATION  01/28/2012   Procedure: CESAREAN SECTION WITH BILATERAL TUBAL LIGATION;  Surgeon: Alwyn Pea, MD;  Location: [place] [facility];  Service: Obstetrics;  Laterality: Bilateral;  Repeat C/S - POSSIBLE BTL   CYSTOSCOPY WITH STENT PLACEMENT Bilateral 01/13/2020   Procedure: CYSTOSCOPY WITH STENT PLACEMENT;  Surgeon: Delsa Bern, MD;  Location: [place];  Service: Gynecology;  Laterality: Bilateral;  procedure 1   DILATION AND CURETTAGE OF UTERUS     DILITATION & CURRETTAGE/HYSTROSCOPY WITH HYDROTHERMAL ABLATION N/A 07/17/2018   Procedure: DILATATION & CURETTAGE/HYSTEROSCOPY WITH FAILED HYDROTHERMAL ABLATION;  Surgeon: Delsa Bern, MD;  Location: [place];  Service: Gynecology;  Laterality: N/A;   HYSTEROSCOPY WITH NOVASURE N/A 07/17/2018   Procedure:  HYSTEROSCOPY WITH NOVASURE;  Surgeon: Delsa Bern, MD;  Location: [place];  Service: Gynecology;  Laterality: N/A;   LAPAROSCOPY N/A 01/13/2020   Procedure: LAPAROSCOPY DIAGNOSTIC;  Surgeon: Delsa Bern, MD;  Location: [place];  Service: Gynecology;  Laterality: N/A;   salpyngooophorectomy     LSO 2005 for TOA    Family History  Problem Relation Age of Onset   Heart disease Mother    Diabetes Father    Heart disease Father        DIED AT 60   Lupus Sister    Uterine cancer Maternal Aunt    Hypertension Maternal Grandmother    Stroke Maternal Grandmother    Heart disease Maternal Grandfather    Diabetes Maternal Grandfather    Asthma Daughter    Colon cancer Neg Hx    Breast cancer Neg Hx    Ovarian cancer Neg Hx    Endometrial cancer Neg Hx    Pancreatic cancer Neg Hx    Prostate cancer Neg Hx     Social History   Socioeconomic History   Marital status: Married    Spouse name: CHRISTOPHER   Number of children: 1   Years of education: 16   Highest education level: Not on file  Occupational History   Occupation: TEACHER    Employer: [employer]  Tobacco Use   Smoking status: Never   Smokeless tobacco:  Never  Vaping Use   Vaping Use: Never used  Substance and Sexual Activity   Alcohol use: Yes    Comment: occ   Drug use: No   Sexual activity: Yes    Partners: Male    Birth control/protection: Surgical  Other Topics Concern   Not on file  Social History Narrative   Not on file   Social Determinants of Health   Financial Resource Strain: Not on file  Food Insecurity: Not on file  Transportation Needs: Not on file  Physical Activity: Not on file  Stress: Not on file  Social Connections: Not on file    Current Medications:  Current Outpatient Medications:    fluticasone (FLONASE) 50 MCG/ACT nasal spray, Place 1 spray into both nostrils daily as needed for allergies. , Disp: , Rfl:    montelukast (SINGULAIR) 10 MG tablet,  Take 10 mg by mouth daily as needed (allergies)., Disp: , Rfl:    triamcinolone cream (KENALOG) 0.5 %, Apply 1 application topically daily as needed (eczema)., Disp: , Rfl:    triamterene-hydrochlorothiazide (MAXZIDE-25) 37.5-25 MG tablet, Take 1 tablet by mouth daily., Disp: , Rfl:   Review of Symptoms: Per HPI   Physical Exam: LMP 01/04/2020 (Exact Date)  Deferred given limitations of phone visit..  Laboratory & Radiologic Studies: 03/02/21: Pelvic MRI IMPRESSION: 1. Complex lobulated multicystic mass in the right adnexa as described, composed of varying degrees of hemorrhagic cysts and endometriomas. No definitely suspicious enhancing nodularity visualized within the cysts. A central focus of enhancement is noted which is nonspecific and may represent the associated ovarian tissue. A follow-up study could be done in 3-6 months to ensure stability. 2. No lymphadenopathy or ascites. 3. Supracervical hysterectomy changes.  Assessment & Plan: NYARI OLSSON is a 46 y.o. woman with history of multiple abdominal surgeries, prior TOA, endometriosis and known pelvic adhesive disease with new complex and symptomatic adnexal mass, MRI findings very suggestive of endometriomas.  The patient and I had discussed MRI findings yesterday by phone.  She called back today to review options for treatment.  She is very much wanting to defer surgery at this time and is interested in endometriosis treatment.  Because she is not having significant symptoms, we talked about several options from a management standpoint.  One would be to start an oral progestin (norethindrone).  We could also use a GnRH agonist, such as Lupron.  I also discussed that we could use a similar type medication, although it is an antagonist, which is an oral medication and would similarly help shut down the ovary Terry Bryant).  I sent the patient information about all 3 medications by MyChart.  She is going to review this and let me  know either in a MyChart message or by phone next week which medication she would like to try.  We had previously discussed that we will plan to repeat imaging in 3-6 months.  If there is decrease in the size of her adnexal mass on treatment for endometriosis and she remains relatively asymptomatic, then it is possible we would be able to defer surgery altogether.  I discussed the assessment and treatment plan with the patient. The patient was provided with an opportunity to ask questions and all were answered. The patient agreed with the plan and demonstrated an understanding of the instructions.   The patient was advised to call back or see an in-person evaluation if the symptoms worsen or if the condition fails to improve as anticipated.   [place]  minutes of total time was spent for this patient encounter, including preparation, face-to-face counseling with the patient and coordination of care, and documentation of the encounter.   Jeral Pinch, MD  Division of Gynecologic Oncology  Department of Obstetrics and Gynecology  [facility]

## 2021-03-03 NOTE — Telephone Encounter (Signed)
Received call from [name] this afternoon. Patient following up after her phone call with Dr. Berline Lopes yesterday. She would like to delay surgery at this time and is unsure if she needs to contact her gynecologist or what the next step is with Dr. Berline Lopes. Patient states she would prefer to work with Dr. Berline Lopes for her endometriosis. Phone visit scheduled for today at 4:30 pm with Dr. Berline Lopes. Patient is in agreement of this.

## 2021-03-03 NOTE — Telephone Encounter (Signed)
In error

## 2021-03-07 ENCOUNTER — Other Ambulatory Visit: Payer: Self-pay | Admitting: Gynecologic Oncology

## 2021-03-07 DIAGNOSIS — N809 Endometriosis, unspecified: Secondary | ICD-10-CM

## 2021-03-07 MED ORDER — NORETHINDRONE ACETATE 5 MG PO TABS: 5.0000 mg | ORAL_TABLET | Freq: Every day | ORAL | 3 refills | Status: DC

## 2021-03-07 NOTE — Progress Notes (Signed)
Per Dr. Berline Lopes, Aygestin sent in for endometriosis.

## 2021-03-10 ENCOUNTER — Encounter (HOSPITAL_COMMUNITY): Admission: RE | Admit: 2021-03-10 | Payer: BC Managed Care – PPO | Source: Ambulatory Visit

## 2021-03-14 ENCOUNTER — Ambulatory Visit: Admit: 2021-03-14 | Payer: BC Managed Care – PPO | Admitting: Gynecologic Oncology

## 2021-03-14 DIAGNOSIS — N9489 Other specified conditions associated with female genital organs and menstrual cycle: Secondary | ICD-10-CM

## 2021-03-14 HISTORY — DX: Other specified conditions associated with female genital organs and menstrual cycle: N94.89

## 2021-03-14 SURGERY — LYSIS, ADHESIONS, ROBOT-ASSISTED, LAPAROSCOPIC
Anesthesia: General

## 2021-03-21 ENCOUNTER — Inpatient Hospital Stay: Payer: BC Managed Care – PPO | Admitting: Gynecologic Oncology

## 2021-03-21 ENCOUNTER — Telehealth: Payer: Self-pay | Admitting: *Deleted

## 2021-03-21 NOTE — Telephone Encounter (Signed)
Per Dr Berline Lopes canceled post op appts, patient aware

## 2021-03-29 ENCOUNTER — Other Ambulatory Visit: Payer: Self-pay | Admitting: Gynecologic Oncology

## 2021-03-29 DIAGNOSIS — N9489 Other specified conditions associated with female genital organs and menstrual cycle: Secondary | ICD-10-CM

## 2021-03-29 DIAGNOSIS — N809 Endometriosis, unspecified: Secondary | ICD-10-CM

## 2021-04-03 ENCOUNTER — Encounter: Payer: BC Managed Care – PPO | Admitting: Gynecologic Oncology

## 2021-05-22 ENCOUNTER — Telehealth: Payer: Self-pay | Admitting: *Deleted

## 2021-05-22 ENCOUNTER — Other Ambulatory Visit: Payer: Self-pay | Admitting: Gynecologic Oncology

## 2021-05-22 ENCOUNTER — Encounter: Payer: Self-pay | Admitting: Gynecologic Oncology

## 2021-05-22 DIAGNOSIS — N9489 Other specified conditions associated with female genital organs and menstrual cycle: Secondary | ICD-10-CM

## 2021-05-22 NOTE — Telephone Encounter (Signed)
Spoke with pt this morning to inform her that per Dr.Tucker she's recommending an ultrasound to begin with to reevaluate the complex adnexal mass. An ultrasound appointment has been scheduled for pt at [facility] on 05/25/21 @ 0930.  Pt is to arrive at 9am with a full bladder.  ?Pt agreed to appointment and verbalized understanding.  ?

## 2021-05-22 NOTE — Progress Notes (Signed)
Per Dr. Berline Lopes, plan for follow up imaging of complex adnexal mass s/p aygestin therapy. ?

## 2021-05-25 ENCOUNTER — Ambulatory Visit (HOSPITAL_COMMUNITY)
Admission: RE | Admit: 2021-05-25 | Discharge: 2021-05-25 | Disposition: A | Discharge status: Home / Self Care | Payer: BC Managed Care – PPO | Source: Ambulatory Visit | Attending: Gynecologic Oncology | Admitting: Gynecologic Oncology

## 2021-05-25 DIAGNOSIS — N9489 Other specified conditions associated with female genital organs and menstrual cycle: Secondary | ICD-10-CM | POA: Insufficient documentation

## 2021-05-26 ENCOUNTER — Inpatient Hospital Stay: Payer: BC Managed Care – PPO | Attending: Gynecologic Oncology | Admitting: Gynecologic Oncology

## 2021-05-26 ENCOUNTER — Encounter: Payer: Self-pay | Admitting: Gynecologic Oncology

## 2021-05-26 DIAGNOSIS — N736 Female pelvic peritoneal adhesions (postinfective): Secondary | ICD-10-CM

## 2021-05-26 DIAGNOSIS — N9489 Other specified conditions associated with female genital organs and menstrual cycle: Secondary | ICD-10-CM

## 2021-05-26 DIAGNOSIS — N809 Endometriosis, unspecified: Secondary | ICD-10-CM | POA: Diagnosis not present

## 2021-05-26 NOTE — Progress Notes (Signed)
Gynecologic Oncology Telehealth Consult Note: Gyn-Onc ? ?I connected with Terry Bryant on 05/26/21 at  4:20 PM EDT by telephone and verified that I am speaking with the correct person using two identifiers. ? ?I discussed the limitations, risks, security and privacy concerns of performing an evaluation and management service by telemedicine and the availability of in-person appointments. I also discussed with the patient that there may be a patient responsible charge related to this service. The patient expressed understanding and agreed to proceed. ? ?Other persons participating in the visit and their role in the encounter: None. ? ?Patient's location: Home ?Provider's location: [place] ? ?Reason for Visit: Follow-up recent ultrasound ? ?Treatment History: ?Patient surgical history is notable for prior left salpingooophorectomy in the setting of tubo-ovarian abscess.  This surgery was performed in 2005, with findings at the time of surgery includimg pelvic adhesions and left tubo-ovarian abscess.  This procedure required conversion from laparoscopy to ex lap.  Pathology from this surgery revealed tubo-ovarian adhesions, ovarian hemorrhagic cyst with features highly suspicious for endometrioma.  Portions of hemorrhagic soft tissue were noted to be associated with fibrinopurulent exudate.  Then in December 2021, in the setting of uterine fibroids and abnormal uterine bleeding with subsequent anemia, the patient underwent robotic converted to open surgery secondary to severe pelvic adhesions and obliteration of the anterior cul-de-sac.  Ureteral stents were placed, a portion of the uterine serosa was left on the bladder, posterior cul-de-sac was noted to have colonic adhesions.  The patient underwent supracervical hysterectomy and right salpingectomy. ?  ?CT of the abdomen and pelvis with contrast performed on 12/9 shows a complex cystic mass within the right adnexa containing multiple internal  septations and mural thickening as well as mild adjacent soft tissue stranding, measuring 9.6 x 6.6, new since previous study.  Differential diagnosis includes cystic ovarian neoplasm, tubo-ovarian abscess, and endometriosis.  Pelvic ultrasound exam performed at [place] on 12/13 shows an 8.6 cm complex cystic mass with thick septations in the right adnexa. ?  ?Tumor markers were obtained in mid December and normal.  CEA was 3 and CA-125 was 31.4. ?  ?Today, the patient reports having an acute episode of abdominal pain 1 evening in mid December.  She thought this was related to having eaten out earlier in the day.  By the next day, her symptoms had improved significantly.  She was not able to get into her primary care physician and decided to go to urgent care.  An x-ray was obtained as well as a CT scan.  Given findings of an adnexal mass on CT scan, it was recommended that she see her OB/GYN.  Her OB/GYN had changed practices, but she ultimately saw Dr. Cletis Media in early January. ? ?Pelvic ultrasound exam performed on 03/02/2021 showed a complex lobulated multicystic mass in the right adnexa composed of varying degrees of hemorrhagic cyst and endometriomas.  No definitively suspicious enhancing nodularity visualized within the cyst.  A central focus of enhancement is noted which is nonspecific and may represent the associated ovarian tissue.  Follow-up study in 3 to 6 months recommended.  No adenopathy or ascites. ? ?Given findings mot suggestive of an endometrioma, we discussed treatment options for endometriosis.  Patient was amenable to proceeding with Aygestin.   ? ?Interval History: ?Patient is overall doing well.  Continues to take Aygestin without significant side effects.  Denies any right-sided abdominal or pelvic pain.  Reports regular bowel and bladder function. ? ?Past Medical/Surgical History: ?  Past Medical History:  ?Diagnosis Date  ? Abnormal electromyogram 04/15/2015  ? Adnexal mass   ?  Allergic rhinitis 04/15/2015  ? Anemia   ? TAKES FE OCC  ? Atopic dermatitis 04/15/2015  ? Blood transfusion 02/16/2011  ? '05 with ovary surgery  ? Blood transfusion without reported diagnosis   ? Carpal tunnel syndrome 04/15/2015  ? Cervical radiculopathy at C6 04/15/2015  ? Constipation 04/15/2015  ? Endometriosis 04/11/2017  ? Endometriosis of colon 2009  ? Enlarged uterus 10/26/2019  ? Fibroids 2004  ? GERD (gastroesophageal reflux disease) 04/15/2015  ? H/O cesarean section 07/17/2011  ? Pt had C/S then developed a left pelvic abscess and had ex lap with LSO. Undecided   ? History of iron deficiency anemia 06/05/2016  ? Hypertension   ? Hypertension 2007  ? DR [facility]   ON MED  ? Hypertensive disorder 09/05/2011  ? Dr Jacklynn Lewis, on procardia '30mg'$  XL  ? Hypokalemia 09/19/2017  ? 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 Metrorrhagia 04/11/2017  ? Numbness and tingling in left arm 04/15/2015  ? Ocular migraine 08/26/2018  ? Pain in left upper arm 01/31/2019  ? Palpitations 07/15/2018  ? Pelvic adhesions 06/06/2011  ? Pneumonia AGE 23  ? Polymenorrhea 04/11/2017  ? Preoperative cardiovascular examination 07/15/2018  ? Status post repeat low transverse cesarean section 01/29/2012  ? Umbilical hernia without obstruction and without gangrene 04/15/2015  ? Uterine leiomyoma 09/05/2011  ? ? ?Past Surgical History:  ?Procedure Laterality Date  ? ABDOMINAL HYSTERECTOMY N/A 01/13/2020  ? Procedure: ABDOMINAL SUPRACERVICAL HYSTERECTOMY with RIGHT SALPINGECTOMY;  Surgeon: Delsa Bern, MD;  Location: [place];  Service: Gynecology;  Laterality: N/A;  ? BREAST CYST EXCISION  02/22/2011  ? Procedure: CYST EXCISION BREAST;  Surgeon: Haywood Lasso, MD;  Location: [facility];  Service: General;  Laterality: Left;  Removal Left Breast Mass  ? CESAREAN SECTION    ? CESAREAN SECTION WITH BILATERAL TUBAL LIGATION  01/28/2012  ? Procedure: CESAREAN SECTION WITH BILATERAL TUBAL LIGATION;  Surgeon: Alwyn Pea, MD;  Location: [place] [facility];   Service: Obstetrics;  Laterality: Bilateral;  Repeat C/S - POSSIBLE BTL  ? CYSTOSCOPY WITH STENT PLACEMENT Bilateral 01/13/2020  ? Procedure: CYSTOSCOPY WITH STENT PLACEMENT;  Surgeon: Delsa Bern, MD;  Location: [place];  Service: Gynecology;  Laterality: Bilateral;  procedure 1  ? DILATION AND CURETTAGE OF UTERUS    ? DILITATION & CURRETTAGE/HYSTROSCOPY WITH HYDROTHERMAL ABLATION N/A 07/17/2018  ? Procedure: DILATATION & CURETTAGE/HYSTEROSCOPY WITH FAILED HYDROTHERMAL ABLATION;  Surgeon: Delsa Bern, MD;  Location: [place];  Service: Gynecology;  Laterality: N/A;  ? HYSTEROSCOPY WITH NOVASURE N/A 07/17/2018  ? Procedure: HYSTEROSCOPY WITH NOVASURE;  Surgeon: Delsa Bern, MD;  Location: [place];  Service: Gynecology;  Laterality: N/A;  ? LAPAROSCOPY N/A 01/13/2020  ? Procedure: LAPAROSCOPY DIAGNOSTIC;  Surgeon: Delsa Bern, MD;  Location: [place];  Service: Gynecology;  Laterality: N/A;  ? salpyngooophorectomy    ? LSO 2005 for TOA  ? ? ?Family History  ?Problem Relation Age of Onset  ? Heart disease Mother   ? Diabetes Father   ? Heart disease Father   ?     DIED AT 45  ? Lupus Sister   ? Uterine cancer Maternal Aunt   ? Hypertension Maternal Grandmother   ? Stroke Maternal Grandmother   ? Heart disease Maternal Grandfather   ? Diabetes Maternal Grandfather   ? Asthma Daughter   ? Colon cancer Neg Hx   ?  Breast cancer Neg Hx   ? Ovarian cancer Neg Hx   ? Endometrial cancer Neg Hx   ? Pancreatic cancer Neg Hx   ? Prostate cancer Neg Hx   ? ? ?Social History  ? ?Socioeconomic History  ? Marital status: Married  ?  Spouse name: CHRISTOPHER  ? Number of children: 1  ? Years of education: 60  ? Highest education level: Not on file  ?Occupational History  ? Occupation: TEACHER  ?  Employer: [employer]  ?Tobacco Use  ? Smoking status: Never  ? Smokeless tobacco: Never  ?Vaping Use  ? Vaping Use: Never used  ?Substance and Sexual Activity  ? Alcohol use: Yes  ?  Comment:  occ  ? Drug use: No  ? Sexual activity: Yes  ?  Partners: Male  ?  Birth control/protection: Surgical  ?Other Topics Concern  ? Not on file  ?Social History Narrative  ? Not on file  ? ?Social Determinant

## 2021-05-29 ENCOUNTER — Telehealth: Payer: BC Managed Care – PPO | Admitting: Gynecologic Oncology

## 2021-07-18 ENCOUNTER — Encounter: Payer: Self-pay | Admitting: Gynecologic Oncology

## 2021-07-19 ENCOUNTER — Other Ambulatory Visit: Payer: Self-pay | Admitting: Gynecologic Oncology

## 2021-07-19 DIAGNOSIS — N809 Endometriosis, unspecified: Secondary | ICD-10-CM

## 2021-07-19 MED ORDER — NORETHINDRONE ACETATE 5 MG PO TABS: 5.0000 mg | ORAL_TABLET | Freq: Every day | ORAL | 6 refills | Status: DC

## 2021-07-19 NOTE — Progress Notes (Signed)
Refill sent in per pt request.

## 2021-08-14 ENCOUNTER — Inpatient Hospital Stay: Payer: BC Managed Care – PPO | Attending: Gynecologic Oncology | Admitting: Gynecologic Oncology

## 2021-08-14 ENCOUNTER — Encounter: Payer: Self-pay | Admitting: Gynecologic Oncology

## 2021-08-14 DIAGNOSIS — N9489 Other specified conditions associated with female genital organs and menstrual cycle: Secondary | ICD-10-CM | POA: Diagnosis not present

## 2021-08-14 DIAGNOSIS — N809 Endometriosis, unspecified: Secondary | ICD-10-CM | POA: Diagnosis not present

## 2021-08-14 NOTE — Progress Notes (Signed)
Gynecologic Oncology Telehealth Consult Note: Gyn-Onc  I connected with Terry Bryant on 08/14/21 at  4:00 PM EDT by telephone and verified that I am speaking with the correct person using two identifiers.  I discussed the limitations, risks, security and privacy concerns of performing an evaluation and management service by telemedicine and the availability of in-person appointments. I also discussed with the patient that there may be a patient responsible charge related to this service. The patient expressed understanding and agreed to proceed.  Other persons participating in the visit and their role in the encounter: non.  Patient's location: home Provider's location: [facility]  Reason for Visit: follow-up in the setting of endometriosis, adnexal mass  Treatment History: Patient surgical history is notable for prior left salpingooophorectomy in the setting of tubo-ovarian abscess.  This surgery was performed in 2005, with findings at the time of surgery includimg pelvic adhesions and left tubo-ovarian abscess.  This procedure required conversion from laparoscopy to ex lap.  Pathology from this surgery revealed tubo-ovarian adhesions, ovarian hemorrhagic cyst with features highly suspicious for endometrioma.  Portions of hemorrhagic soft tissue were noted to be associated with fibrinopurulent exudate.  Then in December 2021, in the setting of uterine fibroids and abnormal uterine bleeding with subsequent anemia, the patient underwent robotic converted to open surgery secondary to severe pelvic adhesions and obliteration of the anterior cul-de-sac.  Ureteral stents were placed, a portion of the uterine serosa was left on the bladder, posterior cul-de-sac was noted to have colonic adhesions.  The patient underwent supracervical hysterectomy and right salpingectomy.   CT of the abdomen and pelvis with contrast performed on 12/9 shows a complex cystic mass within the right adnexa containing multiple  internal septations and mural thickening as well as mild adjacent soft tissue stranding, measuring 9.6 x 6.6, new since previous study.  Differential diagnosis includes cystic ovarian neoplasm, tubo-ovarian abscess, and endometriosis.  Pelvic ultrasound exam performed at [place] on 12/13 shows an 8.6 cm complex cystic mass with thick septations in the right adnexa.   Tumor markers were obtained in mid December and normal.  CEA was 3 and CA-125 was 31.4.   Today, the patient reports having an acute episode of abdominal pain 1 evening in mid December.  She thought this was related to having eaten out earlier in the day.  By the next day, her symptoms had improved significantly.  She was not able to get into her primary care physician and decided to go to urgent care.  An x-ray was obtained as well as a CT scan.  Given findings of an adnexal mass on CT scan, it was recommended that she see her OB/GYN.  Her OB/GYN had changed practices, but she ultimately saw Dr. Cletis Media in early January.   Pelvic ultrasound exam performed on 03/02/2021 showed a complex lobulated multicystic mass in the right adnexa composed of varying degrees of hemorrhagic cyst and endometriomas.  No definitively suspicious enhancing nodularity visualized within the cyst.  A central focus of enhancement is noted which is nonspecific and may represent the associated ovarian tissue.  Follow-up study in 3 to 6 months recommended.  No adenopathy or ascites.   Given findings mot suggestive of an endometrioma, we discussed treatment options for endometriosis.  Patient was amenable to proceeding with Aygestin.    Pelvic ultrasound exam performed on 05/25/2021 showed right ovary now measures 3.5 x 4.2 x 4.1 cm with a simple cystic area measuring up to 2.8 cm and a hypoechoic area  measuring up to 1.4 cm.  Left ovary not visualized, uterus surgically absent.   Interval History: Doing well. Denies abdominal or pelvic. Reports normal bowel  function.  Occasional spotting, sporadic. Has had it since her hysterectomy, unchanged.  She is scheduled for mammogram and pap smear with her OBGYN in late August.   Past Medical/Surgical History: Past Medical History:  Diagnosis Date   Abnormal electromyogram 04/15/2015   Adnexal mass    Allergic rhinitis 04/15/2015   Anemia    TAKES FE OCC   Atopic dermatitis 04/15/2015   Blood transfusion 02/16/2011   '05 with ovary surgery   Blood transfusion without reported diagnosis    Carpal tunnel syndrome 04/15/2015   Cervical radiculopathy at C6 04/15/2015   Constipation 04/15/2015   Endometriosis 04/11/2017   Endometriosis of colon 2009   Enlarged uterus 10/26/2019   Fibroids 2004   GERD (gastroesophageal reflux disease) 04/15/2015   H/O cesarean section 07/17/2011   Pt had C/S then developed a left pelvic abscess and had ex lap with LSO. 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   Metrorrhagia 04/11/2017   Numbness and tingling in left arm 04/15/2015   Ocular migraine 08/26/2018   Pain in left upper arm 01/31/2019   Palpitations 07/15/2018   Pelvic adhesions 06/06/2011   Pneumonia AGE 72   Polymenorrhea 04/11/2017   Preoperative cardiovascular examination 07/15/2018   Status post repeat low transverse cesarean section 22/97/9892   Umbilical hernia without obstruction and without gangrene 04/15/2015   Uterine leiomyoma 09/05/2011    Past Surgical History:  Procedure Laterality Date   ABDOMINAL HYSTERECTOMY N/A 01/13/2020   Procedure: ABDOMINAL SUPRACERVICAL HYSTERECTOMY with RIGHT SALPINGECTOMY;  Surgeon: Delsa Bern, MD;  Location: [facility];  Service: Gynecology;  Laterality: N/A;   BREAST CYST EXCISION  02/22/2011   Procedure: CYST EXCISION BREAST;  Surgeon:  Haywood Lasso, MD;  Location: [facility];  Service: General;  Laterality: Left;  Removal Left Breast Mass   CESAREAN SECTION     CESAREAN SECTION WITH BILATERAL TUBAL LIGATION  01/28/2012   Procedure: CESAREAN SECTION WITH BILATERAL TUBAL LIGATION;  Surgeon: Alwyn Pea, MD;  Location: [facility];  Service: Obstetrics;  Laterality: Bilateral;  Repeat C/S - POSSIBLE BTL   CYSTOSCOPY WITH STENT PLACEMENT Bilateral 01/13/2020   Procedure: CYSTOSCOPY WITH STENT PLACEMENT;  Surgeon: Delsa Bern, MD;  Location: [facility];  Service: Gynecology;  Laterality: Bilateral;  procedure 1   DILATION AND CURETTAGE OF UTERUS     DILITATION & CURRETTAGE/HYSTROSCOPY WITH HYDROTHERMAL ABLATION N/A 07/17/2018   Procedure: DILATATION & CURETTAGE/HYSTEROSCOPY WITH FAILED HYDROTHERMAL ABLATION;  Surgeon: Delsa Bern, MD;  Location: [facility];  Service: Gynecology;  Laterality: N/A;   HYSTEROSCOPY WITH NOVASURE N/A 07/17/2018   Procedure: HYSTEROSCOPY WITH NOVASURE;  Surgeon: Delsa Bern, MD;  Location: [facility];  Service: Gynecology;  Laterality: N/A;   LAPAROSCOPY N/A 01/13/2020   Procedure: LAPAROSCOPY DIAGNOSTIC;  Surgeon: Delsa Bern, MD;  Location: [facility];  Service: Gynecology;  Laterality: N/A;   salpyngooophorectomy     LSO 2005 for TOA    Family History  Problem Relation Age of Onset   Heart disease Mother    Diabetes Father    Heart disease Father  DIED AT 45   Lupus Sister    Uterine cancer Maternal Aunt    Hypertension Maternal Grandmother    Stroke Maternal Grandmother    Heart disease Maternal Grandfather    Diabetes Maternal Grandfather    Asthma Daughter    Colon cancer Neg Hx    Breast cancer Neg Hx    Ovarian cancer Neg Hx    Endometrial cancer Neg Hx    Pancreatic cancer Neg Hx    Prostate cancer Neg Hx     Social History   Socioeconomic History   Marital status: Married    Spouse name: CHRISTOPHER   Number of children: 1   Years of  education: 16   Highest education level: Not on file  Occupational History   Occupation: Product manager: [employer]  Tobacco Use   Smoking status: Never   Smokeless tobacco: Never  Vaping Use   Vaping Use: Never used  Substance and Sexual Activity   Alcohol use: Yes    Comment: occ   Drug use: No   Sexual activity: Yes    Partners: Male    Birth control/protection: Surgical  Other Topics Concern   Not on file  Social History Narrative   Not on file   Social Determinants of Health   Financial Resource Strain: Not on file  Food Insecurity: Not on file  Transportation Needs: Not on file  Physical Activity: Not on file  Stress: Not on file  Social Connections: Not on file    Current Medications:  Current Outpatient Medications:    fluticasone (FLONASE) 50 MCG/ACT nasal spray, Place 1 spray into both nostrils daily as needed for allergies. , Disp: , Rfl:    montelukast (SINGULAIR) 10 MG tablet, Take 10 mg by mouth daily as needed (allergies)., Disp: , Rfl:    norethindrone (AYGESTIN) 5 MG tablet, Take 1 tablet (5 mg total) by mouth daily., Disp: 30 tablet, Rfl: 6   triamcinolone cream (KENALOG) 0.5 %, Apply 1 application topically daily as needed (eczema)., Disp: , Rfl:    triamterene-hydrochlorothiazide (MAXZIDE-25) 37.5-25 MG tablet, Take 1 tablet by mouth daily., Disp: , Rfl:   Review of Symptoms: Pertinent positives as per HPI.  Physical Exam: LMP 01/04/2020 (Exact Date)  Deferred given limitations of phone visit.  Laboratory & Radiologic Studies: None new  Assessment & Plan: Terry Bryant is a 46 y.o. woman with history of multiple abdominal surgeries, prior TOA, endometriosis and known pelvic adhesive disease with new complex and symptomatic adnexal mass, MRI findings very suggestive of endometriomas, with significant decrease in the size of her right adnexal mass on progesterone therapy.  Patient continues to do well on hormonal therapy.   Remains asymptomatic.  Repeat ultrasound performed in April showed significant decrease in size of the right ovarian mass.  There is now a simple cyst as well as a hypoechoic lesion that looks most consistent with a hemorrhagic cyst or endometrioma.  We had previously discussed releasing her back to her OB/GYN if she continued to do well on norethindrone.  I will send a message to her OB/GYN.  She is scheduled in August for Pap test as well as mammogram.  We discussed duration of therapy for the Aygestin.  My recommendation would be to keep her on some form of hormonal suppression until she reaches menopause.  This will help prevent redevelopment of a large ovarian cyst.   I discussed the assessment and treatment plan with the patient. The patient  was provided with an opportunity to ask questions and all were answered. The patient agreed with the plan and demonstrated an understanding of the instructions.   The patient was advised to call back or see an in-person evaluation if the symptoms worsen or if the condition fails to improve as anticipated.   12 minutes of total time was spent for this patient encounter, including preparation, face-to-face counseling with the patient and coordination of care, and documentation of the encounter.   Jeral Pinch, MD  Division of Gynecologic Oncology  Department of Obstetrics and Gynecology  [facility]

## 2021-10-31 ENCOUNTER — Other Ambulatory Visit: Payer: Self-pay | Admitting: Obstetrics and Gynecology

## 2021-10-31 DIAGNOSIS — R928 Other abnormal and inconclusive findings on diagnostic imaging of breast: Secondary | ICD-10-CM

## 2021-11-06 ENCOUNTER — Ambulatory Visit: Payer: BC Managed Care – PPO

## 2021-11-06 ENCOUNTER — Ambulatory Visit
Admission: RE | Admit: 2021-11-06 | Discharge: 2021-11-06 | Disposition: A | Discharge status: Home / Self Care | Payer: BC Managed Care – PPO | Source: Ambulatory Visit | Attending: Obstetrics and Gynecology | Admitting: Obstetrics and Gynecology

## 2021-11-06 DIAGNOSIS — R928 Other abnormal and inconclusive findings on diagnostic imaging of breast: Secondary | ICD-10-CM

## 2022-02-21 ENCOUNTER — Other Ambulatory Visit: Payer: Self-pay

## 2022-02-21 ENCOUNTER — Emergency Department (HOSPITAL_BASED_OUTPATIENT_CLINIC_OR_DEPARTMENT_OTHER)
Admission: EM | Admit: 2022-02-21 | Discharge: 2022-02-21 | Disposition: A | Discharge status: Home / Self Care | Payer: BC Managed Care – PPO | Attending: Emergency Medicine | Admitting: Emergency Medicine

## 2022-02-21 ENCOUNTER — Emergency Department (HOSPITAL_BASED_OUTPATIENT_CLINIC_OR_DEPARTMENT_OTHER): Payer: BC Managed Care – PPO

## 2022-02-21 ENCOUNTER — Encounter (HOSPITAL_BASED_OUTPATIENT_CLINIC_OR_DEPARTMENT_OTHER): Payer: Self-pay | Admitting: Urology

## 2022-02-21 DIAGNOSIS — M79602 Pain in left arm: Secondary | ICD-10-CM

## 2022-02-21 LAB — CBC
HCT: 41.2 % (ref 36.0–46.0)
Hemoglobin: 12.9 g/dL (ref 12.0–15.0)
MCH: 24.5 pg — ABNORMAL LOW (ref 26.0–34.0)
MCHC: 31.3 g/dL (ref 30.0–36.0)
MCV: 78.3 fL — ABNORMAL LOW (ref 80.0–100.0)
Platelets: 338 10*3/uL (ref 150–400)
RBC: 5.26 MIL/uL — ABNORMAL HIGH (ref 3.87–5.11)
RDW: 14 % (ref 11.5–15.5)
WBC: 6.7 10*3/uL (ref 4.0–10.5)
nRBC: 0 % (ref 0.0–0.2)

## 2022-02-21 LAB — BASIC METABOLIC PANEL
Anion gap: 10 (ref 5–15)
BUN: 15 mg/dL (ref 6–20)
CO2: 27 mmol/L (ref 22–32)
Calcium: 9.4 mg/dL (ref 8.9–10.3)
Chloride: 98 mmol/L (ref 98–111)
Creatinine, Ser: 1.05 mg/dL — ABNORMAL HIGH (ref 0.44–1.00)
GFR, Estimated: >60 mL/min (ref >60)
Glucose, Bld: 103 mg/dL — ABNORMAL HIGH (ref 70–99)
Potassium: 3.3 mmol/L — ABNORMAL LOW (ref 3.5–5.1)
Sodium: 135 mmol/L (ref 135–145)

## 2022-02-21 LAB — TROPONIN I (HIGH SENSITIVITY)
Troponin I (High Sensitivity): 3 ng/L (ref <18)
Troponin I (High Sensitivity): 3 ng/L (ref <18)

## 2022-02-21 NOTE — ED Triage Notes (Signed)
Pt states PCP sent for EKG changes  States left arm pain that started today 1200  Denies N/V, Denies SOB   H/o enlarged heart

## 2022-02-21 NOTE — ED Notes (Signed)
Patient transported to X-ray 

## 2022-02-21 NOTE — ED Notes (Signed)
D/c paperwork reviewed with pt, including follow up care.  No questions or concerns voiced at time of d/c. . Pt verbalized understanding, Ambulatory without assistance to ED exit, NAD.   

## 2022-02-21 NOTE — ED Provider Notes (Signed)
[facility] EMERGENCY DEPARTMENT Provider Note   CSN: [id number] Arrival date & time: 02/21/22  1654     History {Add pertinent medical, surgical, social history, OB history to HPI:1} Chief Complaint  Patient presents with   EKG changes    Terry Bryant is a 47 y.o. female presenting to the left arm discomfort.  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  She says it feels like throbbing pain down her entire left arm.  She has not had this feeling before.  It is not associated with deep inspiration or with activity.  The symptoms have improved since it started but it is still present.  She denies any trauma preceding this.  She denies any lightheaded, diaphoresis, nausea or vomiting.  She noted that the school nurse found that her blood pressure was "higher than normal" and referred her to urgent care.  Urgent care they did an EKG where there is some concern for potential EKG changes and sent her into the ED.  She has had an extensive cardiac workup including in July 2020, where she had a CT coronary scan with a calcium score of 0.  Echocardiogram that same year was unremarkable.  She did have long-term cardiac monitoring performed which showed occasional sinus tachycardia jumping to a heart rate of the 178, otherwise regular heart rate.  She does report that she experiences anxiety attacks and sudden jumps and her heart rate at home, particularly at night.  She denies any history of DVT or PE.  She is on estrogen medication as a cancer preventative.  HPI     Home Medications Prior to Admission medications   Medication Sig Start Date End Date Taking? Authorizing Provider  fluticasone (FLONASE) 50 MCG/ACT nasal spray Place 1 spray into both nostrils daily as needed for allergies.     Joline Salt, RN  montelukast (SINGULAIR) 10 MG tablet Take 10 mg by mouth daily  as needed (allergies).    Joline Salt, RN  norethindrone (AYGESTIN) 5 MG tablet Take 1 tablet (5 mg total) by mouth daily. 07/19/21   Cross, Lenna Sciara D, NP  triamcinolone cream (KENALOG) 0.5 % Apply 1 application topically daily as needed (eczema).    [provider]  triamterene-hydrochlorothiazide (MAXZIDE-25) 37.5-25 MG tablet Take 1 tablet by mouth daily.    Joline Salt, RN      Allergies    Bee venom and Penicillins    Review of Systems   Review of Systems  Physical Exam Updated Vital Signs BP (!) 146/92 (BP Location: Right Arm)   Pulse 69   Temp 97.8 F (36.6 C) (Oral)   Resp 14   Ht '5\' 6"'$  (1.676 m)   Wt 93.4 kg   LMP 01/04/2020 (Exact Date)   SpO2 99%   BMI 33.23 kg/m  Physical Exam Constitutional:      General: She is not in acute distress. HENT:     Head: Normocephalic and atraumatic.  Eyes:     Conjunctiva/sclera: Conjunctivae normal.     Pupils: Pupils are equal, round, and reactive to light.  Cardiovascular:     Rate and Rhythm: Normal rate and regular rhythm.  Pulmonary:     Effort: Pulmonary effort is normal. No respiratory distress.  Abdominal:     General: There is no distension.     Palpations: There is no mass.  Tenderness: There is no abdominal tenderness.  Skin:    General: Skin is warm and dry.  Neurological:     General: No focal deficit present.     Mental Status: She is alert and oriented to person, place, and time. Mental status is at baseline.     Sensory: No sensory deficit.     Motor: No weakness.  Psychiatric:        Mood and Affect: Mood normal.        Behavior: Behavior normal.     ED Results / Procedures / Treatments   Labs (all labs ordered are listed, but only abnormal results are displayed) Labs Reviewed  BASIC METABOLIC PANEL - Abnormal; Notable for the following components:      Result Value   Potassium 3.3 (*)    Glucose, Bld 103 (*)    Creatinine, Ser 1.05 (*)    All other components within normal  limits  CBC - Abnormal; Notable for the following components:   RBC 5.26 (*)    MCV 78.3 (*)    MCH 24.5 (*)    All other components within normal limits  TROPONIN I (HIGH SENSITIVITY)  TROPONIN I (HIGH SENSITIVITY)    EKG None  Radiology DG Chest 2 View  Result Date: 02/21/2022 CLINICAL DATA:  Left arm pain.  EKG changes. EXAM: CHEST - 2 VIEW COMPARISON:  X-ray and CT angiogram 10/25/2020. Older exams as well. FINDINGS: No consolidation, pneumothorax or effusion. Normal cardiopericardial silhouette without edema. IMPRESSION: No acute cardiopulmonary disease. Electronically Signed   By: Jill Side M.D.   On: 02/21/2022 17:36    Procedures Procedures  {Document cardiac monitor, telemetry assessment procedure when appropriate:1}  Medications Ordered in ED Medications - No data to display  ED Course/ Medical Decision Making/ A&P                           Medical Decision Making Amount and/or Complexity of Data Reviewed Labs: ordered. Radiology: ordered.   This patient presents to the ED with concern for left arm pain. This involves an extensive number of treatment options, and is a complaint that carries with it a high risk of complications and morbidity.  The differential diagnosis includes radiculopathy versus DVT versus muscle pain versus atypical ACS versus other  Co-morbidities that complicate the patient evaluation: Estrogen use a slightly higher risk of venous thrombosis  External records from outside source obtained and reviewed including 2021 cardiac w/u, CTCA  I ordered and personally interpreted labs.  The pertinent results include: Delta troponins are flat negative.  Very mild hypokalemia.  Remaining labs unremarkable  I ordered imaging studies including x-ray of the chest, vascular DVT ultrasound left upper extremity I independently visualized and interpreted imaging which showed *** I agree with the radiologist interpretation  The patient was maintained on a  cardiac monitor.  I personally viewed and interpreted the cardiac monitored which showed an underlying rhythm of: Sinus rhythm  Per my interpretation the patient's ECG shows normal sinus rhythm with nonspecific T wave inversion in lead III.  I do not appreciate any significant changes in the lateral leads.  No STEMI  I have reviewed the patients home medicines and have made adjustments as needed  Test Considered: I have a low suspicion for pulmonary embolism clinically not feel she needed emergent CT PE study.  After the interventions noted above, I reevaluated the patient and found that they have: improved  Social Determinants  of Health:***  Dispostion:  After consideration of the diagnostic results and the patients response to treatment, I feel that the patent would benefit from ***.   {Document critical care time when appropriate:1} {Document review of labs and clinical decision tools ie heart score, Chads2Vasc2 etc:1}  {Document your independent review of radiology images, and any outside records:1} {Document your discussion with family members, caretakers, and with consultants:1} {Document social determinants of health affecting pt's care:1} {Document your decision making why or why not admission, treatments were needed:1} Final Clinical Impression(s) / ED Diagnoses Final diagnoses:  None    Rx / DC Orders ED Discharge Orders     None

## 2022-02-21 NOTE — ED Notes (Signed)
Ultrasound complete 

## 2022-02-21 NOTE — ED Notes (Signed)
ED Provider at bedside. 

## 2022-02-21 NOTE — ED Provider Triage Note (Signed)
Emergency Medicine Provider Triage Evaluation Note  Terry Bryant , a 47 y.o. female  was evaluated in triage.  States she went to urgent care at her PCP because her BP was elevated at work. She states at work her left arm started "bothering her." States at [facility] they did an EKG which the provider noted changes from previous and sent her to the ER. Currently denying chest pain. States she is having ongoing left arm pain. Mild SOB. No palpitations..  Review of Systems  Positive: See above Negative:   Physical Exam  BP (!) 147/107   Pulse 82   Temp 98 F (36.7 C) (Oral)   Resp 16   Ht '5\' 6"'$  (1.676 m)   Wt 93.4 kg   LMP 01/04/2020 (Exact Date)   SpO2 97%   BMI 33.23 kg/m  Gen:   Awake, no distress   Resp:  Normal effort  MSK:   Moves extremities without difficulty  Other:  S1/s2 without murmur  Medical Decision Making  Medically screening exam initiated at 6:47 PM.  Appropriate orders placed.  Terry Bryant was informed that the remainder of the evaluation will be completed by another provider, this initial triage assessment does not replace that evaluation, and the importance of remaining in the ED until their evaluation is complete.  Will order repeat EKG for ongoing left arm pain.    Mickie Hillier, PA-C 02/21/22 [phone]

## 2022-02-21 NOTE — Discharge Instructions (Signed)
You should return to the emergency department if you have worsening pain, loss of feeling in your arm, chest pain, dizziness, lightheadedness, loss of consciousness, or any other emergency medical concerns.  I would recommend using ibuprofen and Tylenol as needed as over-the-counter the medicines for your left arm pain.

## 2022-02-23 NOTE — Progress Notes (Unsigned)
Cardiology Office Note:    Date:  02/26/2022   ID:  Terry Bryant, DOB 08/05/1975, MRN [id number]  PCP:  Deborah Chalk, FNP   [facility] Providers Cardiologist:  Shirlee More, MD     Referring MD: Deborah Chalk, FNP   Chief Complaint: follow-up left arm pain   History of Present Illness:    Terry Bryant is a very pleasant 47 y.o. female with a hx of HTN, iron deficiency anemia, chest pain, coronary CTA with no evidence of CAD, 0 Agatston units.   Referred to cardiology for palpitations and seen by Dr. Bettina Gavia 07/2018. Preop EKG for hysteroscopic endometrial ablation revealed very minor nonspecific T wave abnormality in inferior leads felt by Dr. Bettina Gavia to be near normal. She had been seen in ED on at least 3 occasions for chest pain. Cardiac monitor completed 08/14/18 revealed sinus rhythm with rare ventricular and supraventricular ectopy, no evidence of atrial fibrillation/flutter or SVT. Echo revealed normal LV function with mild LV hypertrophy, no significant valve disease. Coronary CTA revealed calcium score of 0.   Last cardiology clinic visit was 12/23/19 with Dr. Bettina Gavia. At the time she was seeking clearance for upcoming laparoscopic cholecystectomy. No changes were made to her treatment plan and she was advised to follow-up as needed.  Today, she is here for follow-up from recent ED visit. On 02/21/22 she had numbness and tingling in her left arm and her BP was elevated at 142/92 mmHg. Also felt nauseated which has been occurring more frequently recently. Contacted her PCP and was advised to go to the UC associated with their practice. It was felt that EKG was changed, so she was sent to ED. In ED, she had negative troponin x 2 and EKG was repeated. With low suspicion for ACS, she was advised to follow-up with cardiology clinic. Left arm pain has improved, still notes some numbness on occasion. Was taken off nifedipine and started on triamterene-hctz recently by PCP. Has  been working on improved diet and is currently practicing intermittent fasting. Does not exercise on a consistent basis. She denies chest pain, shortness of breath, lower extremity edema, fatigue, palpitations, melena, hematuria, hemoptysis, diaphoresis, weakness, presyncope, syncope, orthopnea, and PND.   Past Medical History:  Diagnosis Date   Abnormal electromyogram 04/15/2015   Adnexal mass    Allergic rhinitis 04/15/2015   Anemia    TAKES FE OCC   Atopic dermatitis 04/15/2015   Blood transfusion 02/16/2011   '05 with ovary surgery   Blood transfusion without reported diagnosis    Carpal tunnel syndrome 04/15/2015   Cervical radiculopathy at C6 04/15/2015   Constipation 04/15/2015   Endometriosis 04/11/2017   Endometriosis of colon 2009   Enlarged uterus 10/26/2019   Fibroids 2004   GERD (gastroesophageal reflux disease) 04/15/2015   H/O cesarean section 07/17/2011   Pt had C/S then developed a left pelvic abscess and had ex lap with LSO. 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   Metrorrhagia 04/11/2017   Numbness and tingling in left arm 04/15/2015   Ocular migraine 08/26/2018   Pain in left upper arm 01/31/2019   Palpitations 07/15/2018   Pelvic adhesions 06/06/2011   Pneumonia AGE 43   Polymenorrhea 04/11/2017   Preoperative  cardiovascular examination 07/15/2018   Status post repeat low transverse cesarean section 17/79/3903   Umbilical hernia without obstruction and without gangrene 04/15/2015   Uterine leiomyoma 09/05/2011    Past Surgical History:  Procedure Laterality Date   ABDOMINAL HYSTERECTOMY N/A 01/13/2020   Procedure: ABDOMINAL SUPRACERVICAL HYSTERECTOMY with RIGHT SALPINGECTOMY;  Surgeon: Delsa Bern, MD;  Location: [place];  Service:  Gynecology;  Laterality: N/A;   BREAST CYST EXCISION  02/22/2011   Procedure: CYST EXCISION BREAST;  Surgeon: Haywood Lasso, MD;  Location: [facility];  Service: General;  Laterality: Left;  Removal Left Breast Mass   CESAREAN SECTION     CESAREAN SECTION WITH BILATERAL TUBAL LIGATION  01/28/2012   Procedure: CESAREAN SECTION WITH BILATERAL TUBAL LIGATION;  Surgeon: Alwyn Pea, MD;  Location: [facility];  Service: Obstetrics;  Laterality: Bilateral;  Repeat C/S - POSSIBLE BTL   CYSTOSCOPY WITH STENT PLACEMENT Bilateral 01/13/2020   Procedure: CYSTOSCOPY WITH STENT PLACEMENT;  Surgeon: Delsa Bern, MD;  Location: [place];  Service: Gynecology;  Laterality: Bilateral;  procedure 1   DILATION AND CURETTAGE OF UTERUS     DILITATION & CURRETTAGE/HYSTROSCOPY WITH HYDROTHERMAL ABLATION N/A 07/17/2018   Procedure: DILATATION & CURETTAGE/HYSTEROSCOPY WITH FAILED HYDROTHERMAL ABLATION;  Surgeon: Delsa Bern, MD;  Location: [place];  Service: Gynecology;  Laterality: N/A;   HYSTEROSCOPY WITH NOVASURE N/A 07/17/2018   Procedure: HYSTEROSCOPY WITH NOVASURE;  Surgeon: Delsa Bern, MD;  Location: [place];  Service: Gynecology;  Laterality: N/A;   LAPAROSCOPY N/A 01/13/2020   Procedure: LAPAROSCOPY DIAGNOSTIC;  Surgeon: Delsa Bern, MD;  Location: [place];  Service: Gynecology;  Laterality: N/A;   salpyngooophorectomy     LSO 2005 for TOA    Current Medications: Current Meds  Medication Sig   diclofenac Sodium (VOLTAREN) 1 % GEL APPLY 2 GRAMS TOPICALLY TO THE AFFECTED AREA THREE TIMES DAILY FOR 5 DAYS   fluticasone (FLONASE) 50 MCG/ACT nasal spray Place 1 spray into both nostrils daily as needed for allergies.    iron polysaccharides (FERREX 150) 150 MG capsule    montelukast (SINGULAIR) 10 MG tablet Take 10 mg by mouth daily as needed (allergies).   norethindrone (AYGESTIN) 5 MG tablet Take 1 tablet (5 mg total) by mouth daily.   triamcinolone cream (KENALOG) 0.5 %  Apply 1 application topically daily as needed (eczema).   triamterene-hydrochlorothiazide (MAXZIDE-25) 37.5-25 MG tablet Take 1 tablet by mouth daily.     Allergies:   Bee venom and Penicillins   Social History   Socioeconomic History   Marital status: Married    Spouse name: CHRISTOPHER   Number of children: 1   Years of education: 16   Highest education level: Not on file  Occupational History   Occupation: TEACHER    Employer: [employer]  Tobacco Use   Smoking status: Never   Smokeless tobacco: Never  Vaping Use   Vaping Use: Never used  Substance and Sexual Activity   Alcohol use: Yes    Comment: occ   Drug use: No   Sexual activity: Yes    Partners: Male    Birth control/protection: Surgical  Other Topics Concern   Not on file  Social History Narrative   Not on file   Social Determinants of Health   Financial Resource Strain: Not on file  Food Insecurity: Not on file  Transportation Needs: Not on file  Physical Activity: Not on file  Stress: Not on file  Social Connections: Not on  file     Family History: The patient's family history includes Asthma in her daughter; Diabetes in her father and maternal grandfather; Heart disease in her father, maternal grandfather, and mother; Hypertension in her maternal grandmother; Lupus in her sister; Stroke in her maternal grandmother; Uterine cancer in her maternal aunt. There is no history of Colon cancer, Breast cancer, Ovarian cancer, Endometrial cancer, Pancreatic cancer, or Prostate cancer.  ROS:   Please see the history of present illness.  All other systems reviewed and are negative.  Labs/Other Studies Reviewed:    The following studies were reviewed today:  CCTA 09/12/2018 1. Coronary artery calcium score 0 Agatston units. This suggests low risk for future cardiac events.   2.  No significant coronary disease was visualized.  Echo 09/11/2018 1. The left ventricle has normal systolic function  with an ejection  fraction of 60-65%. The cavity size was normal. There is mildly increased  left ventricular wall thickness. Left ventricular diastolic Doppler  parameters are consistent with impaired  relaxation.   2. The right ventricle has normal systolic function. The cavity was  normal. There is no increase in right ventricular wall thickness.   3. The aorta is normal in size and structure.   4. The inferior vena cava was normal in size with <50% respiratory  variability.   Cardiac Monitor 08/14/2018 The rhythm throughout was sinus with minimum average and maximum heart rates of 53, 88 and 178 bpm.  The maximum heart rate was sinus tachycardia.   There was no pauses of 3 seconds or greater and no episodes of sinus node or AV block.   Ventricular ectopy was rare with isolated PVC   Supraventricular ectopy was rare with isolated APCs.  There are no episodes of atrial fibrillation flutter or SVT.   There was one diary entry of chest pain with sinus rhythm 74 bpm     Recent Labs: 02/21/2022: BUN 15; Creatinine, Ser 1.05; Hemoglobin 12.9; Platelets 338; Potassium 3.3; Sodium 135  Recent Lipid Panel No results found for: "CHOL", "TRIG", "HDL", "CHOLHDL", "VLDL", "LDLCALC", "LDLDIRECT"   Risk Assessment/Calculations:       Physical Exam:    VS:  BP 128/80   Pulse 74   Ht '5\' 5"'$  (1.651 m)   Wt 205 lb 6.4 oz (93.2 kg)   LMP 01/04/2020 (Exact Date)   SpO2 98%   BMI 34.18 kg/m     Wt Readings from Last 3 Encounters:  02/26/22 205 lb 6.4 oz (93.2 kg)  02/21/22 205 lb 14.6 oz (93.4 kg)  02/24/21 206 lb (93.4 kg)     GEN:  Well nourished, well developed in no acute distress HEENT: Normal NECK: No JVD; No carotid bruits CARDIAC: RRR, no murmurs, rubs, gallops RESPIRATORY:  Clear to auscultation without rales, wheezing or rhonchi  ABDOMEN: Soft, non-tender, non-distended MUSCULOSKELETAL:  No edema; No deformity. 2+ pedal pulses, equal bilaterally SKIN: Warm and  dry NEUROLOGIC:  Alert and oriented x 3 PSYCHIATRIC:  Normal affect   EKG:  EKG is not ordered today.     Diagnoses:    1. Cardiac risk counseling   2. Abnormal electrocardiogram   3. Essential hypertension   4. Pain in left upper arm    Assessment and Plan:     Abnormal EKG: EKG 02/21/22 revealed question of new TWI in lead III and documentation of LVH. She has tall peaked QRS complexes that have been present on past EKGs. Older EKGs have presence of flattened T waves. No  chest pain, normal troponin. Advised continued monitoring and healthy lifestyle as noted below.   Left arm pain: Intense left arm pain that led to ED visit 02/21/22 has improved. Occasional mild pain in left arm described as numbness. Admits she got anxious because of family hx of early CAD. Encouraged her to monitor for numbness, symptoms that may be 2/2 nerve impingement and report to PCP.   Cardiac risk counseling: We discussed her coronary CTA in 2020 with no evidence of CAD, calcium score of 0.  Encouraged heart healthy, mostly plant based diet, 150 minutes of moderate intensity exercise each week, good BP control, and good cholesterol control for heart health. LDL elevated at 167 June 2023. She is working on better diet and exercise regimen. I will see her back in 6 months to readdress.   Hypertension: BP is well controlled today. Recent medication change by PCP. Advised her to continue to monitor. She reports occasional episodes of tachycardia. Could consider low dose BB if additional BP control is needed.      Disposition: 6 months with me  Medication Adjustments/Labs and Tests Ordered: Current medicines are reviewed at length with the patient today.  Concerns regarding medicines are outlined above.  No orders of the defined types were placed in this encounter.  No orders of the defined types were placed in this encounter.   Patient Instructions  Medication Instructions:   Your physician recommends that  you continue on your current medications as directed. Please refer to the Current Medication list given to you today.   *If you need a refill on your cardiac medications before your next appointment, please call your pharmacy*   Lab Work:  None ordered.  If you have labs (blood work) drawn today and your tests are completely normal, you will receive your results only by: [place] (if you have MyChart) OR A paper copy in the mail If you have any lab test that is abnormal or we need to change your treatment, we will call you to review the results.   Testing/Procedures:  None ordered.   Follow-Up: At [facility], you and your health needs are our priority.  As part of our continuing mission to provide you with exceptional heart care, we have created designated Provider Care Teams.  These Care Teams include your primary Cardiologist (physician) and Advanced Practice Providers (APPs -  Physician Assistants and Nurse Practitioners) who all work together to provide you with the care you need, when you need it.  We recommend signing up for the patient portal called "MyChart".  Sign up information is provided on this After Visit Summary.  MyChart is used to connect with patients for Virtual Visits (Telemedicine).  Patients are able to view lab/test results, encounter notes, upcoming appointments, etc.  Non-urgent messages can be sent to your provider as well.   To learn more about what you can do with MyChart, go to [email or web address].    Your next appointment:   5 month(s)  Provider:   Christen Bame, NP         Other Instructions  DASH Eating Plan DASH stands for Dietary Approaches to Stop Hypertension. The DASH eating plan is a healthy eating plan that has been shown to: Reduce high blood pressure (hypertension). Reduce your risk for type 2 diabetes, heart disease, and stroke. Help with weight loss. What are tips for following this plan? Reading food  labels Check food labels for the amount of salt (sodium) per serving. Choose foods with  less than 5 percent of the Daily Value of sodium. Generally, foods with less than 300 milligrams (mg) of sodium per serving fit into this eating plan. To find whole grains, look for the word "whole" as the first word in the ingredient list. Shopping Buy products labeled as "low-sodium" or "no salt added." Buy fresh foods. Avoid canned foods and pre-made or frozen meals. Cooking Avoid adding salt when cooking. Use salt-free seasonings or herbs instead of table salt or sea salt. Check with your health care provider or pharmacist before using salt substitutes. Do not fry foods. Cook foods using healthy methods such as baking, boiling, grilling, roasting, and broiling instead. Cook with heart-healthy oils, such as olive, canola, avocado, soybean, or sunflower oil. Meal planning  Eat a balanced diet that includes: 4 or more servings of fruits and 4 or more servings of vegetables each day. Try to fill one-half of your plate with fruits and vegetables. 6-8 servings of whole grains each day. Less than 6 oz (170 g) of lean meat, poultry, or fish each day. A 3-oz (85-g) serving of meat is about the same size as a deck of cards. One egg equals 1 oz (28 g). 2-3 servings of low-fat dairy each day. One serving is 1 cup (237 mL). 1 serving of nuts, seeds, or beans 5 times each week. 2-3 servings of heart-healthy fats. Healthy fats called omega-3 fatty acids are found in foods such as walnuts, flaxseeds, fortified milks, and eggs. These fats are also found in cold-water fish, such as sardines, salmon, and mackerel. Limit how much you eat of: Canned or prepackaged foods. Food that is high in trans fat, such as some fried foods. Food that is high in saturated fat, such as fatty meat. Desserts and other sweets, sugary drinks, and other foods with added sugar. Full-fat dairy products. Do not salt foods before eating. Do not  eat more than 4 egg yolks a week. Try to eat at least 2 vegetarian meals a week. Eat more home-cooked food and less restaurant, buffet, and fast food. Lifestyle When eating at a restaurant, ask that your food be prepared with less salt or no salt, if possible. If you drink alcohol: Limit how much you use to: 0-1 drink a day for women who are not pregnant. 0-2 drinks a day for men. Be aware of how much alcohol is in your drink. In the [place] one drink equals one 12 oz bottle of beer (355 mL), one 5 oz glass of wine (148 mL), or one 1 oz glass of hard liquor (44 mL). General information Avoid eating more than 2,300 mg of salt a day. If you have hypertension, you may need to reduce your sodium intake to 1,500 mg a day. Work with your health care provider to maintain a healthy body weight or to lose weight. Ask what an ideal weight is for you. Get at least 30 minutes of exercise that causes your heart to beat faster (aerobic exercise) most days of the week. Activities may include walking, swimming, or biking. Work with your health care provider or dietitian to adjust your eating plan to your individual calorie needs. What foods should I eat? Fruits All fresh, dried, or frozen fruit. Canned fruit in natural juice (without added sugar). Vegetables Fresh or frozen vegetables (raw, steamed, roasted, or grilled). Low-sodium or reduced-sodium tomato and vegetable juice. Low-sodium or reduced-sodium tomato sauce and tomato paste. Low-sodium or reduced-sodium canned vegetables. Grains Whole-grain or whole-wheat bread. Whole-grain or whole-wheat pasta. [place] Shark  rice. Oatmeal. Quinoa. Bulgur. Whole-grain and low-sodium cereals. Pita bread. Low-fat, low-sodium crackers. Whole-wheat flour tortillas. Meats and other proteins Skinless chicken or [place]. Ground chicken or [place]. Pork with fat trimmed off. Fish and seafood. Egg whites. Dried beans, peas, or lentils. Unsalted nuts, nut butters, and seeds. Unsalted  canned beans. Lean cuts of beef with fat trimmed off. Low-sodium, lean precooked or cured meat, such as sausages or meat loaves. Dairy Low-fat (1%) or fat-free (skim) milk. Reduced-fat, low-fat, or fat-free cheeses. Nonfat, low-sodium ricotta or cottage cheese. Low-fat or nonfat yogurt. Low-fat, low-sodium cheese. Fats and oils Soft margarine without trans fats. Vegetable oil. Reduced-fat, low-fat, or light mayonnaise and salad dressings (reduced-sodium). Canola, safflower, olive, avocado, soybean, and sunflower oils. Avocado. Seasonings and condiments Herbs. Spices. Seasoning mixes without salt. Other foods Unsalted popcorn and pretzels. Fat-free sweets. The items listed above may not be a complete list of foods and beverages you can eat. Contact a dietitian for more information. What foods should I avoid? Fruits Canned fruit in a light or heavy syrup. Fried fruit. Fruit in cream or butter sauce. Vegetables Creamed or fried vegetables. Vegetables in a cheese sauce. Regular canned vegetables (not low-sodium or reduced-sodium). Regular canned tomato sauce and paste (not low-sodium or reduced-sodium). Regular tomato and vegetable juice (not low-sodium or reduced-sodium). Angie Fava. Olives. Grains Baked goods made with fat, such as croissants, muffins, or some breads. Dry pasta or rice meal packs. Meats and other proteins Fatty cuts of meat. Ribs. Fried meat. Berniece Salines. Bologna, salami, and other precooked or cured meats, such as sausages or meat loaves. Fat from the back of a pig (fatback). Bratwurst. Salted nuts and seeds. Canned beans with added salt. Canned or smoked fish. Whole eggs or egg yolks. Chicken or [place] with skin. Dairy Whole or 2% milk, cream, and half-and-half. Whole or full-fat cream cheese. Whole-fat or sweetened yogurt. Full-fat cheese. Nondairy creamers. Whipped toppings. Processed cheese and cheese spreads. Fats and oils Butter. Stick margarine. Lard. Shortening. Ghee. Bacon fat.  Tropical oils, such as coconut, palm kernel, or palm oil. Seasonings and condiments Onion salt, garlic salt, seasoned salt, table salt, and sea salt. Worcestershire sauce. Tartar sauce. Barbecue sauce. Teriyaki sauce. Soy sauce, including reduced-sodium. Steak sauce. Canned and packaged gravies. Fish sauce. Oyster sauce. Cocktail sauce. Store-bought horseradish. Ketchup. Mustard. Meat flavorings and tenderizers. Bouillon cubes. Hot sauces. Pre-made or packaged marinades. Pre-made or packaged taco seasonings. Relishes. Regular salad dressings. Other foods Salted popcorn and pretzels. The items listed above may not be a complete list of foods and beverages you should avoid. Contact a dietitian for more information. Where to find more information National Heart, Lung, and Blood Institute: https://wilson-eaton.com/ American Heart Association: www.heart.org Academy of Nutrition and Dietetics: www.eatright.[place]: www.kidney.org Summary The DASH eating plan is a healthy eating plan that has been shown to reduce high blood pressure (hypertension). It may also reduce your risk for type 2 diabetes, heart disease, and stroke. When on the DASH eating plan, aim to eat more fresh fruits and vegetables, whole grains, lean proteins, low-fat dairy, and heart-healthy fats. With the DASH eating plan, you should limit salt (sodium) intake to 2,300 mg a day. If you have hypertension, you may need to reduce your sodium intake to 1,500 mg a day. Work with your health care provider or dietitian to adjust your eating plan to your individual calorie needs. This information is not intended to replace advice given to you by your health care provider. Make sure you discuss any  questions you have with your health care provider. Document Revised: 01/02/2019 Document Reviewed: 01/02/2019 Elsevier Patient Education  [place] refers to food and lifestyle choices  that are based on the traditions of countries located on the [employer]. It focuses on eating more fruits, vegetables, whole grains, beans, nuts, seeds, and heart-healthy fats, and eating less dairy, meat, eggs, and processed foods with added sugar, salt, and fat. This way of eating has been shown to help prevent certain conditions and improve outcomes for people who have chronic diseases, like kidney disease and heart disease. What are tips for following this plan? Reading food labels Check the serving size of packaged foods. For foods such as rice and pasta, the serving size refers to the amount of cooked product, not dry. Check the total fat in packaged foods. Avoid foods that have saturated fat or trans fats. Check the ingredient list for added sugars, such as corn syrup. Shopping  Buy a variety of foods that offer a balanced diet, including: Fresh fruits and vegetables (produce). Grains, beans, nuts, and seeds. Some of these may be available in unpackaged forms or large amounts (in bulk). Fresh seafood. Poultry and eggs. Low-fat dairy products. Buy whole ingredients instead of prepackaged foods. Buy fresh fruits and vegetables in-season from local farmers markets. Buy plain frozen fruits and vegetables. If you do not have access to quality fresh seafood, buy precooked frozen shrimp or canned fish, such as tuna, salmon, or sardines. Stock your pantry so you always have certain foods on hand, such as olive oil, canned tuna, canned tomatoes, rice, pasta, and beans. Cooking Cook foods with extra-virgin olive oil instead of using butter or other vegetable oils. Have meat as a side dish, and have vegetables or grains as your main dish. This means having meat in small portions or adding small amounts of meat to foods like pasta or stew. Use beans or vegetables instead of meat in common dishes like chili or lasagna. Experiment with different cooking methods. Try roasting, broiling,  steaming, and sauting vegetables. Add frozen vegetables to soups, stews, pasta, or rice. Add nuts or seeds for added healthy fats and plant protein at each meal. You can add these to yogurt, salads, or vegetable dishes. Marinate fish or vegetables using olive oil, lemon juice, garlic, and fresh herbs. Meal planning Plan to eat one vegetarian meal one day each week. Try to work up to two vegetarian meals, if possible. Eat seafood two or more times a week. Have healthy snacks readily available, such as: Vegetable sticks with hummus. Greek yogurt. Fruit and nut trail mix. Eat balanced meals throughout the week. This includes: Fruit: 2-3 servings a day. Vegetables: 4-5 servings a day. Low-fat dairy: 2 servings a day. Fish, poultry, or lean meat: 1 serving a day. Beans and legumes: 2 or more servings a week. Nuts and seeds: 1-2 servings a day. Whole grains: 6-8 servings a day. Extra-virgin olive oil: 3-4 servings a day. Limit red meat and sweets to only a few servings a month. Lifestyle  Cook and eat meals together with your family, when possible. Drink enough fluid to keep your urine pale yellow. Be physically active every day. This includes: Aerobic exercise like running or swimming. Leisure activities like gardening, walking, or housework. Get 7-8 hours of sleep each night. If recommended by your health care provider, drink red wine in moderation. This means 1 glass a day for nonpregnant women and 2 glasses a day for men.  A glass of wine equals 5 oz (150 mL). What foods should I eat? Fruits Apples. Apricots. Avocado. Berries. Bananas. Cherries. Dates. Figs. Grapes. Lemons. Melon. Oranges. Peaches. Plums. Pomegranate. Vegetables Artichokes. Beets. Broccoli. Cabbage. Carrots. Eggplant. Green beans. Chard. Kale. Spinach. Onions. Leeks. Peas. Squash. Tomatoes. Peppers. Radishes. Grains Whole-grain pasta. Brown rice. Bulgur wheat. Polenta. Couscous. Whole-wheat bread. [place]. Meats and other proteins Beans. Almonds. Sunflower seeds. Pine nuts. Peanuts. [place]. Salmon. Scallops. Shrimp. [place]. Tilapia. Clams. Oysters. Eggs. Poultry without skin. Dairy Low-fat milk. Cheese. Greek yogurt. Fats and oils Extra-virgin olive oil. Avocado oil. Grapeseed oil. Beverages Water. Red wine. Herbal tea. Sweets and desserts Greek yogurt with honey. Baked apples. Poached pears. Trail mix. Seasonings and condiments Basil. Cilantro. Coriander. Cumin. Mint. Parsley. Sage. Rosemary. Tarragon. Garlic. Oregano. Thyme. Pepper. Balsamic vinegar. Tahini. Hummus. Tomato sauce. Olives. Mushrooms. The items listed above may not be a complete list of foods and beverages you can eat. Contact a dietitian for more information. What foods should I limit? This is a list of foods that should be eaten rarely or only on special occasions. Fruits Fruit canned in syrup. Vegetables Deep-fried potatoes (french fries). Grains Prepackaged pasta or rice dishes. Prepackaged cereal with added sugar. Prepackaged snacks with added sugar. Meats and other proteins Beef. Pork. Lamb. Poultry with skin. Hot dogs. Berniece Salines. Dairy Ice cream. Sour cream. Whole milk. Fats and oils Butter. Canola oil. Vegetable oil. Beef fat (tallow). Lard. Beverages Juice. Sugar-sweetened soft drinks. Beer. Liquor and spirits. Sweets and desserts Cookies. Cakes. Pies. Candy. Seasonings and condiments Mayonnaise. Pre-made sauces and marinades. The items listed above may not be a complete list of foods and beverages you should limit. Contact a dietitian for more information. Summary The Mediterranean diet includes both food and lifestyle choices. Eat a variety of fresh fruits and vegetables, beans, nuts, seeds, and whole grains. Limit the amount of red meat and sweets that you eat. If recommended by your health care provider, drink red wine in moderation. This means 1 glass a day for nonpregnant women and 2 glasses a day  for men. A glass of wine equals 5 oz (150 mL). This information is not intended to replace advice given to you by your health care provider. Make sure you discuss any questions you have with your health care provider. Document Revised: 03/06/2019 Document Reviewed: 01/01/2019 Elsevier Patient Education  [place]. Adopting a Healthy Lifestyle.   Weight: Know what a healthy weight is for you (roughly BMI <25) and aim to maintain this. You can calculate your body mass index on your smart phone  Diet: Aim for 7+ servings of fruits and vegetables daily Limit animal fats in diet for cholesterol and heart health - choose grass fed whenever available Avoid highly processed foods (fast food burgers, tacos, fried chicken, pizza, hot dogs, french fries)  Saturated fat comes in the form of butter, lard, coconut oil, margarine, partially hydrogenated oils, and fat in meat. These increase your risk of cardiovascular disease.  Use healthy plant oils, such as olive, canola, soy, corn, sunflower and peanut.  Whole foods such as fruits, vegetables and whole grains have fiber  Men need > 38 grams of fiber per day Women need > 25 grams of fiber per day  Load up on vegetables and fruits - one-half of your plate: Aim for color and variety, and remember that potatoes dont count. Go for whole grains - one-quarter of your plate: Whole wheat, barley, wheat berries, quinoa, oats, brown rice, and foods  made with them. If you want pasta, go with whole wheat pasta. Protein power - one-quarter of your plate: Fish, chicken, beans, and nuts are all healthy, versatile protein sources. Limit red meat. You need carbohydrates for energy! The type of carbohydrate is more important than the amount. Choose carbohydrates such as vegetables, fruits, whole grains, beans, and nuts in the place of white rice, white pasta, potatoes (baked or fried), macaroni and cheese, cakes, cookies, and donuts.  If youre thirsty, drink water.  Coffee and tea are good in moderation, but skip sugary drinks and limit milk and dairy products to one or two daily servings. Keep sugar intake at 6 teaspoons or 24 grams or LESS       Exercise: Aim for 150 min of moderate intensity exercise weekly for heart health, and weights twice weekly for bone health Stay active - any steps are better than no steps! Aim for 7-9 hours of sleep daily          Signed, Emmaline Life, NP  02/26/2022 11:59 AM    [place]

## 2022-02-26 ENCOUNTER — Ambulatory Visit: Payer: BC Managed Care – PPO | Attending: Nurse Practitioner | Admitting: Nurse Practitioner

## 2022-02-26 ENCOUNTER — Encounter: Payer: Self-pay | Admitting: Nurse Practitioner

## 2022-02-26 VITALS — BP 128/80 | HR 74 | Ht 65.0 in | Wt 205.4 lb

## 2022-02-26 DIAGNOSIS — M79622 Pain in left upper arm: Secondary | ICD-10-CM

## 2022-02-26 DIAGNOSIS — R9431 Abnormal electrocardiogram [ECG] [EKG]: Secondary | ICD-10-CM

## 2022-02-26 DIAGNOSIS — Z7189 Other specified counseling: Secondary | ICD-10-CM | POA: Diagnosis not present

## 2022-02-26 DIAGNOSIS — I10 Essential (primary) hypertension: Secondary | ICD-10-CM | POA: Diagnosis not present

## 2022-02-26 NOTE — Patient Instructions (Signed)
Medication Instructions:   Your physician recommends that you continue on your current medications as directed. Please refer to the Current Medication list given to you today.   *If you need a refill on your cardiac medications before your next appointment, please call your pharmacy*   Lab Work:  None ordered.  If you have labs (blood work) drawn today and your tests are completely normal, you will receive your results only by: [place] (if you have MyChart) OR A paper copy in the mail If you have any lab test that is abnormal or we need to change your treatment, we will call you to review the results.   Testing/Procedures:  None ordered.   Follow-Up: At [facility], you and your health needs are our priority.  As part of our continuing mission to provide you with exceptional heart care, we have created designated Provider Care Teams.  These Care Teams include your primary Cardiologist (physician) and Advanced Practice Providers (APPs -  Physician Assistants and Nurse Practitioners) who all work together to provide you with the care you need, when you need it.  We recommend signing up for the patient portal called "MyChart".  Sign up information is provided on this After Visit Summary.  MyChart is used to connect with patients for Virtual Visits (Telemedicine).  Patients are able to view lab/test results, encounter notes, upcoming appointments, etc.  Non-urgent messages can be sent to your provider as well.   To learn more about what you can do with MyChart, go to [email or web address].    Your next appointment:   5 month(s)  Provider:   Christen Bame, NP         Other Instructions  DASH Eating Plan DASH stands for Dietary Approaches to Stop Hypertension. The DASH eating plan is a healthy eating plan that has been shown to: Reduce high blood pressure (hypertension). Reduce your risk for type 2 diabetes, heart disease, and stroke. Help with weight  loss. What are tips for following this plan? Reading food labels Check food labels for the amount of salt (sodium) per serving. Choose foods with less than 5 percent of the Daily Value of sodium. Generally, foods with less than 300 milligrams (mg) of sodium per serving fit into this eating plan. To find whole grains, look for the word "whole" as the first word in the ingredient list. Shopping Buy products labeled as "low-sodium" or "no salt added." Buy fresh foods. Avoid canned foods and pre-made or frozen meals. Cooking Avoid adding salt when cooking. Use salt-free seasonings or herbs instead of table salt or sea salt. Check with your health care provider or pharmacist before using salt substitutes. Do not fry foods. Cook foods using healthy methods such as baking, boiling, grilling, roasting, and broiling instead. Cook with heart-healthy oils, such as olive, canola, avocado, soybean, or sunflower oil. Meal planning  Eat a balanced diet that includes: 4 or more servings of fruits and 4 or more servings of vegetables each day. Try to fill one-half of your plate with fruits and vegetables. 6-8 servings of whole grains each day. Less than 6 oz (170 g) of lean meat, poultry, or fish each day. A 3-oz (85-g) serving of meat is about the same size as a deck of cards. One egg equals 1 oz (28 g). 2-3 servings of low-fat dairy each day. One serving is 1 cup (237 mL). 1 serving of nuts, seeds, or beans 5 times each week. 2-3 servings of heart-healthy fats. Healthy fats called  omega-3 fatty acids are found in foods such as walnuts, flaxseeds, fortified milks, and eggs. These fats are also found in cold-water fish, such as sardines, salmon, and mackerel. Limit how much you eat of: Canned or prepackaged foods. Food that is high in trans fat, such as some fried foods. Food that is high in saturated fat, such as fatty meat. Desserts and other sweets, sugary drinks, and other foods with added  sugar. Full-fat dairy products. Do not salt foods before eating. Do not eat more than 4 egg yolks a week. Try to eat at least 2 vegetarian meals a week. Eat more home-cooked food and less restaurant, buffet, and fast food. Lifestyle When eating at a restaurant, ask that your food be prepared with less salt or no salt, if possible. If you drink alcohol: Limit how much you use to: 0-1 drink a day for women who are not pregnant. 0-2 drinks a day for men. Be aware of how much alcohol is in your drink. In the [place] one drink equals one 12 oz bottle of beer (355 mL), one 5 oz glass of wine (148 mL), or one 1 oz glass of hard liquor (44 mL). General information Avoid eating more than 2,300 mg of salt a day. If you have hypertension, you may need to reduce your sodium intake to 1,500 mg a day. Work with your health care provider to maintain a healthy body weight or to lose weight. Ask what an ideal weight is for you. Get at least 30 minutes of exercise that causes your heart to beat faster (aerobic exercise) most days of the week. Activities may include walking, swimming, or biking. Work with your health care provider or dietitian to adjust your eating plan to your individual calorie needs. What foods should I eat? Fruits All fresh, dried, or frozen fruit. Canned fruit in natural juice (without added sugar). Vegetables Fresh or frozen vegetables (raw, steamed, roasted, or grilled). Low-sodium or reduced-sodium tomato and vegetable juice. Low-sodium or reduced-sodium tomato sauce and tomato paste. Low-sodium or reduced-sodium canned vegetables. Grains Whole-grain or whole-wheat bread. Whole-grain or whole-wheat pasta. Brown rice. [place]. Bulgur. Whole-grain and low-sodium cereals. Pita bread. Low-fat, low-sodium crackers. Whole-wheat flour tortillas. Meats and other proteins Skinless chicken or [place]. Ground chicken or [place]. Pork with fat trimmed off. Fish and seafood. Egg whites. Dried  beans, peas, or lentils. Unsalted nuts, nut butters, and seeds. Unsalted canned beans. Lean cuts of beef with fat trimmed off. Low-sodium, lean precooked or cured meat, such as sausages or meat loaves. Dairy Low-fat (1%) or fat-free (skim) milk. Reduced-fat, low-fat, or fat-free cheeses. Nonfat, low-sodium ricotta or cottage cheese. Low-fat or nonfat yogurt. Low-fat, low-sodium cheese. Fats and oils Soft margarine without trans fats. Vegetable oil. Reduced-fat, low-fat, or light mayonnaise and salad dressings (reduced-sodium). Canola, safflower, olive, avocado, soybean, and sunflower oils. Avocado. Seasonings and condiments Herbs. Spices. Seasoning mixes without salt. Other foods Unsalted popcorn and pretzels. Fat-free sweets. The items listed above may not be a complete list of foods and beverages you can eat. Contact a dietitian for more information. What foods should I avoid? Fruits Canned fruit in a light or heavy syrup. Fried fruit. Fruit in cream or butter sauce. Vegetables Creamed or fried vegetables. Vegetables in a cheese sauce. Regular canned vegetables (not low-sodium or reduced-sodium). Regular canned tomato sauce and paste (not low-sodium or reduced-sodium). Regular tomato and vegetable juice (not low-sodium or reduced-sodium). Angie Fava. Olives. Grains Baked goods made with fat, such as croissants, muffins, or some  breads. Dry pasta or rice meal packs. Meats and other proteins Fatty cuts of meat. Ribs. Fried meat. Berniece Salines. Bologna, salami, and other precooked or cured meats, such as sausages or meat loaves. Fat from the back of a pig (fatback). Bratwurst. Salted nuts and seeds. Canned beans with added salt. Canned or smoked fish. Whole eggs or egg yolks. Chicken or [place] with skin. Dairy Whole or 2% milk, cream, and half-and-half. Whole or full-fat cream cheese. Whole-fat or sweetened yogurt. Full-fat cheese. Nondairy creamers. Whipped toppings. Processed cheese and cheese  spreads. Fats and oils Butter. Stick margarine. Lard. Shortening. Ghee. Bacon fat. Tropical oils, such as coconut, palm kernel, or palm oil. Seasonings and condiments Onion salt, garlic salt, seasoned salt, table salt, and sea salt. Worcestershire sauce. Tartar sauce. Barbecue sauce. Teriyaki sauce. Soy sauce, including reduced-sodium. Steak sauce. Canned and packaged gravies. Fish sauce. Oyster sauce. Cocktail sauce. Store-bought horseradish. Ketchup. Mustard. Meat flavorings and tenderizers. Bouillon cubes. Hot sauces. Pre-made or packaged marinades. Pre-made or packaged taco seasonings. Relishes. Regular salad dressings. Other foods Salted popcorn and pretzels. The items listed above may not be a complete list of foods and beverages you should avoid. Contact a dietitian for more information. Where to find more information National Heart, Lung, and Blood Institute: https://wilson-eaton.com/ American Heart Association: www.heart.org Academy of Nutrition and Dietetics: www.eatright.[place]: www.kidney.org Summary The DASH eating plan is a healthy eating plan that has been shown to reduce high blood pressure (hypertension). It may also reduce your risk for type 2 diabetes, heart disease, and stroke. When on the DASH eating plan, aim to eat more fresh fruits and vegetables, whole grains, lean proteins, low-fat dairy, and heart-healthy fats. With the DASH eating plan, you should limit salt (sodium) intake to 2,300 mg a day. If you have hypertension, you may need to reduce your sodium intake to 1,500 mg a day. Work with your health care provider or dietitian to adjust your eating plan to your individual calorie needs. This information is not intended to replace advice given to you by your health care provider. Make sure you discuss any questions you have with your health care provider. Document Revised: 01/02/2019 Document Reviewed: 01/02/2019 Elsevier Patient Education  [place] refers to food and lifestyle choices that are based on the traditions of countries located on the [employer]. It focuses on eating more fruits, vegetables, whole grains, beans, nuts, seeds, and heart-healthy fats, and eating less dairy, meat, eggs, and processed foods with added sugar, salt, and fat. This way of eating has been shown to help prevent certain conditions and improve outcomes for people who have chronic diseases, like kidney disease and heart disease. What are tips for following this plan? Reading food labels Check the serving size of packaged foods. For foods such as rice and pasta, the serving size refers to the amount of cooked product, not dry. Check the total fat in packaged foods. Avoid foods that have saturated fat or trans fats. Check the ingredient list for added sugars, such as corn syrup. Shopping  Buy a variety of foods that offer a balanced diet, including: Fresh fruits and vegetables (produce). Grains, beans, nuts, and seeds. Some of these may be available in unpackaged forms or large amounts (in bulk). Fresh seafood. Poultry and eggs. Low-fat dairy products. Buy whole ingredients instead of prepackaged foods. Buy fresh fruits and vegetables in-season from local farmers markets. Buy plain frozen fruits and vegetables. If you do not  have access to quality fresh seafood, buy precooked frozen shrimp or canned fish, such as tuna, salmon, or sardines. Stock your pantry so you always have certain foods on hand, such as olive oil, canned tuna, canned tomatoes, rice, pasta, and beans. Cooking Cook foods with extra-virgin olive oil instead of using butter or other vegetable oils. Have meat as a side dish, and have vegetables or grains as your main dish. This means having meat in small portions or adding small amounts of meat to foods like pasta or stew. Use beans or vegetables instead of meat in common dishes like chili or  lasagna. Experiment with different cooking methods. Try roasting, broiling, steaming, and sauting vegetables. Add frozen vegetables to soups, stews, pasta, or rice. Add nuts or seeds for added healthy fats and plant protein at each meal. You can add these to yogurt, salads, or vegetable dishes. Marinate fish or vegetables using olive oil, lemon juice, garlic, and fresh herbs. Meal planning Plan to eat one vegetarian meal one day each week. Try to work up to two vegetarian meals, if possible. Eat seafood two or more times a week. Have healthy snacks readily available, such as: Vegetable sticks with hummus. Greek yogurt. Fruit and nut trail mix. Eat balanced meals throughout the week. This includes: Fruit: 2-3 servings a day. Vegetables: 4-5 servings a day. Low-fat dairy: 2 servings a day. Fish, poultry, or lean meat: 1 serving a day. Beans and legumes: 2 or more servings a week. Nuts and seeds: 1-2 servings a day. Whole grains: 6-8 servings a day. Extra-virgin olive oil: 3-4 servings a day. Limit red meat and sweets to only a few servings a month. Lifestyle  Cook and eat meals together with your family, when possible. Drink enough fluid to keep your urine pale yellow. Be physically active every day. This includes: Aerobic exercise like running or swimming. Leisure activities like gardening, walking, or housework. Get 7-8 hours of sleep each night. If recommended by your health care provider, drink red wine in moderation. This means 1 glass a day for nonpregnant women and 2 glasses a day for men. A glass of wine equals 5 oz (150 mL). What foods should I eat? Fruits Apples. Apricots. Avocado. Berries. Bananas. Cherries. Dates. Figs. Grapes. Lemons. Melon. Oranges. Peaches. Plums. Pomegranate. Vegetables Artichokes. Beets. Broccoli. Cabbage. Carrots. Eggplant. Green beans. Chard. Kale. Spinach. Onions. Leeks. Peas. Squash. Tomatoes. Peppers. Radishes. Grains Whole-grain pasta. Brown  rice. Bulgur wheat. Polenta. Couscous. Whole-wheat bread. [place]. Meats and other proteins Beans. Almonds. Sunflower seeds. Pine nuts. Peanuts. [place]. Salmon. Scallops. Shrimp. [place]. Tilapia. Clams. Oysters. Eggs. Poultry without skin. Dairy Low-fat milk. Cheese. Greek yogurt. Fats and oils Extra-virgin olive oil. Avocado oil. Grapeseed oil. Beverages Water. Red wine. Herbal tea. Sweets and desserts Greek yogurt with honey. Baked apples. Poached pears. Trail mix. Seasonings and condiments Basil. Cilantro. Coriander. Cumin. Mint. Parsley. Sage. Rosemary. Tarragon. Garlic. Oregano. Thyme. Pepper. Balsamic vinegar. Tahini. Hummus. Tomato sauce. Olives. Mushrooms. The items listed above may not be a complete list of foods and beverages you can eat. Contact a dietitian for more information. What foods should I limit? This is a list of foods that should be eaten rarely or only on special occasions. Fruits Fruit canned in syrup. Vegetables Deep-fried potatoes (french fries). Grains Prepackaged pasta or rice dishes. Prepackaged cereal with added sugar. Prepackaged snacks with added sugar. Meats and other proteins Beef. Pork. Lamb. Poultry with skin. Hot dogs. Berniece Salines. Dairy Ice cream. Sour cream. Whole milk. Fats and oils  Butter. Canola oil. Vegetable oil. Beef fat (tallow). Lard. Beverages Juice. Sugar-sweetened soft drinks. Beer. Liquor and spirits. Sweets and desserts Cookies. Cakes. Pies. Candy. Seasonings and condiments Mayonnaise. Pre-made sauces and marinades. The items listed above may not be a complete list of foods and beverages you should limit. Contact a dietitian for more information. Summary The Mediterranean diet includes both food and lifestyle choices. Eat a variety of fresh fruits and vegetables, beans, nuts, seeds, and whole grains. Limit the amount of red meat and sweets that you eat. If recommended by your health care provider, drink red wine in moderation.  This means 1 glass a day for nonpregnant women and 2 glasses a day for men. A glass of wine equals 5 oz (150 mL). This information is not intended to replace advice given to you by your health care provider. Make sure you discuss any questions you have with your health care provider. Document Revised: 03/06/2019 Document Reviewed: 01/01/2019 Elsevier Patient Education  [place]. Adopting a Healthy Lifestyle.   Weight: Know what a healthy weight is for you (roughly BMI <25) and aim to maintain this. You can calculate your body mass index on your smart phone  Diet: Aim for 7+ servings of fruits and vegetables daily Limit animal fats in diet for cholesterol and heart health - choose grass fed whenever available Avoid highly processed foods (fast food burgers, tacos, fried chicken, pizza, hot dogs, french fries)  Saturated fat comes in the form of butter, lard, coconut oil, margarine, partially hydrogenated oils, and fat in meat. These increase your risk of cardiovascular disease.  Use healthy plant oils, such as olive, canola, soy, corn, sunflower and peanut.  Whole foods such as fruits, vegetables and whole grains have fiber  Men need > 38 grams of fiber per day Women need > 25 grams of fiber per day  Load up on vegetables and fruits - one-half of your plate: Aim for color and variety, and remember that potatoes dont count. Go for whole grains - one-quarter of your plate: Whole wheat, barley, wheat berries, quinoa, oats, brown rice, and foods made with them. If you want pasta, go with whole wheat pasta. Protein power - one-quarter of your plate: Fish, chicken, beans, and nuts are all healthy, versatile protein sources. Limit red meat. You need carbohydrates for energy! The type of carbohydrate is more important than the amount. Choose carbohydrates such as vegetables, fruits, whole grains, beans, and nuts in the place of white rice, white pasta, potatoes (baked or fried), macaroni and  cheese, cakes, cookies, and donuts.  If youre thirsty, drink water. Coffee and tea are good in moderation, but skip sugary drinks and limit milk and dairy products to one or two daily servings. Keep sugar intake at 6 teaspoons or 24 grams or LESS       Exercise: Aim for 150 min of moderate intensity exercise weekly for heart health, and weights twice weekly for bone health Stay active - any steps are better than no steps! Aim for 7-9 hours of sleep daily

## 2022-07-19 ENCOUNTER — Other Ambulatory Visit: Payer: Self-pay | Admitting: Gynecologic Oncology

## 2022-07-19 DIAGNOSIS — N809 Endometriosis, unspecified: Secondary | ICD-10-CM

## 2022-07-19 NOTE — Telephone Encounter (Signed)
Additional refills to come from [facility]

## 2022-07-23 ENCOUNTER — Ambulatory Visit: Payer: BC Managed Care – PPO | Admitting: Nurse Practitioner

## 2022-07-24 NOTE — Progress Notes (Deleted)
Cardiology Office Note:    Date:  07/24/2022   ID:  Terry Bryant, DOB February 10, 1976, MRN [id number]  PCP:  Eather Colas, FNP   [facility] Providers Cardiologist:  Norman Herrlich, MD     Referring MD: Eather Colas, FNP   Chief Complaint: follow-up left arm pain   History of Present Illness:    Terry Bryant is a very pleasant 47 y.o. female with a hx of HTN, iron deficiency anemia, chest pain, coronary CTA with no evidence of CAD, 0 Agatston units.   Referred to cardiology for palpitations and seen by Dr. Dulce Sellar 07/2018. Preop EKG for hysteroscopic endometrial ablation revealed very minor nonspecific T wave abnormality in inferior leads felt by Dr. Dulce Sellar to be near normal. She had been seen in ED on at least 3 occasions for chest pain. Cardiac monitor completed 08/14/18 revealed sinus rhythm with rare ventricular and supraventricular ectopy, no evidence of atrial fibrillation/flutter or SVT. Echo revealed normal LV function with mild LV hypertrophy, no significant valve disease. Coronary CTA revealed calcium score of 0.   Seen in cardiology clinic visit 12/23/19 by Dr. Dulce Sellar. At the time she was seeking clearance for upcoming laparoscopic cholecystectomy. No changes were made to her treatment plan and she was advised to follow-up as needed.  Seen by me on 02/26/22 for follow-up from recent ED visit. On 02/21/22 she had numbness and tingling in her left arm and her BP was elevated at 142/92 mmHg. Also felt nauseated. Contacted PCP and was advised to go to the UC associated with their practice. It was felt that EKG was changed, so she was sent to ED. In ED, she had negative troponin x 2 and EKG was repeated. With low suspicion for ACS, she was advised to follow-up with cardiology clinic. Left arm pain has improved, still notes some numbness on occasion. Was taken off nifedipine and started on triamterene-hctz recently by PCP. Has been working on improved diet and is currently practicing  intermittent fasting. Does not exercise on a consistent basis. No chest pain, shortness of breath, lower extremity edema, fatigue, palpitations, presyncope, syncope, orthopnea, and PND. Symptoms of arm numbness not felt to be cardiac in nature. She was encouraged to work on reducing CV risk with healthy diet, regular exercise, and good BP control.   Today,    Past Medical History:  Diagnosis Date   Abnormal electromyogram 04/15/2015   Adnexal mass    Allergic rhinitis 04/15/2015   Anemia    TAKES FE OCC   Atopic dermatitis 04/15/2015   Blood transfusion 02/16/2011   '05 with ovary surgery   Blood transfusion without reported diagnosis    Carpal tunnel syndrome 04/15/2015   Cervical radiculopathy at C6 04/15/2015   Constipation 04/15/2015   Endometriosis 04/11/2017   Endometriosis of colon 2009   Enlarged uterus 10/26/2019   Fibroids 2004   GERD (gastroesophageal reflux disease) 04/15/2015   H/O cesarean section 07/17/2011   Pt had C/S then developed a left pelvic abscess and had ex lap with LSO. 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   Metrorrhagia 04/11/2017   Numbness and tingling in left arm 04/15/2015   Ocular migraine 08/26/2018   Pain in left upper arm 01/31/2019  Palpitations 07/15/2018   Pelvic adhesions 06/06/2011   Pneumonia AGE 71   Polymenorrhea 04/11/2017   Preoperative cardiovascular examination 07/15/2018   Status post repeat low transverse cesarean section 01/29/2012   Umbilical hernia without obstruction and without gangrene 04/15/2015   Uterine leiomyoma 09/05/2011    Past Surgical History:  Procedure Laterality Date   ABDOMINAL HYSTERECTOMY N/A 01/13/2020   Procedure: ABDOMINAL SUPRACERVICAL HYSTERECTOMY with RIGHT SALPINGECTOMY;   Surgeon: Silverio Lay, MD;  Location: [facility] OR;  Service: Gynecology;  Laterality: N/A;   BREAST CYST EXCISION  02/22/2011   Procedure: CYST EXCISION BREAST;  Surgeon: Currie Paris, MD;  Location: [facility];  Service: General;  Laterality: Left;  Removal Left Breast Mass   CESAREAN SECTION     CESAREAN SECTION WITH BILATERAL TUBAL LIGATION  01/28/2012   Procedure: CESAREAN SECTION WITH BILATERAL TUBAL LIGATION;  Surgeon: Esmeralda Arthur, MD;  Location: [facility];  Service: Obstetrics;  Laterality: Bilateral;  Repeat C/S - POSSIBLE BTL   CYSTOSCOPY WITH STENT PLACEMENT Bilateral 01/13/2020   Procedure: CYSTOSCOPY WITH STENT PLACEMENT;  Surgeon: Silverio Lay, MD;  Location: [facility] OR;  Service: Gynecology;  Laterality: Bilateral;  procedure 1   DILATION AND CURETTAGE OF UTERUS     DILITATION & CURRETTAGE/HYSTROSCOPY WITH HYDROTHERMAL ABLATION N/A 07/17/2018   Procedure: DILATATION & CURETTAGE/HYSTEROSCOPY WITH FAILED HYDROTHERMAL ABLATION;  Surgeon: Silverio Lay, MD;  Location: [facility];  Service: Gynecology;  Laterality: N/A;   HYSTEROSCOPY WITH NOVASURE N/A 07/17/2018   Procedure: HYSTEROSCOPY WITH NOVASURE;  Surgeon: Silverio Lay, MD;  Location: [facility];  Service: Gynecology;  Laterality: N/A;   LAPAROSCOPY N/A 01/13/2020   Procedure: LAPAROSCOPY DIAGNOSTIC;  Surgeon: Silverio Lay, MD;  Location: [facility];  Service: Gynecology;  Laterality: N/A;   salpyngooophorectomy     LSO 2005 for TOA    Current Medications: No outpatient medications have been marked as taking for the 07/30/22 encounter (Appointment) with Lissa Hoard, Zachary George, NP.     Allergies:   Bee venom and Penicillins   Social History   Socioeconomic History   Marital status: Married    Spouse name: CHRISTOPHER   Number of children: 1   Years of education: 16   Highest education level: Not on file  Occupational History   Occupation: TEACHER    Employer: [employer]  Tobacco Use    Smoking status: Never   Smokeless tobacco: Never  Vaping Use   Vaping Use: Never used  Substance and Sexual Activity   Alcohol use: Yes    Comment: occ   Drug use: No   Sexual activity: Yes    Partners: Male    Birth control/protection: Surgical  Other Topics Concern   Not on file  Social History Narrative   Not on file   Social Determinants of Health   Financial Resource Strain: Not on file  Food Insecurity: Not on file  Transportation Needs: Not on file  Physical Activity: Not on file  Stress: Not on file  Social Connections: Not on file     Family History: The patient's family history includes Asthma in her daughter; Diabetes in her father and maternal grandfather; Heart disease in her father, maternal grandfather, and mother; Hypertension in her maternal grandmother; Lupus in her sister; Stroke in her maternal grandmother; Uterine cancer in her maternal aunt. There is no history of Colon cancer, Breast cancer, Ovarian cancer, Endometrial cancer, Pancreatic cancer, or Prostate cancer.  ROS:   Please see the history of present  illness.  *** All other systems reviewed and are negative.  Labs/Other Studies Reviewed:    The following studies were reviewed today:  CCTA 09/12/2018 1. Coronary artery calcium score 0 Agatston units. This suggests low risk for future cardiac events.   2.  No significant coronary disease was visualized.  Echo 09/11/2018 1. The left ventricle has normal systolic function with an ejection  fraction of 60-65%. The cavity size was normal. There is mildly increased  left ventricular wall thickness. Left ventricular diastolic Doppler  parameters are consistent with impaired  relaxation.   2. The right ventricle has normal systolic function. The cavity was  normal. There is no increase in right ventricular wall thickness.   3. The aorta is normal in size and structure.   4. The inferior vena cava was normal in size with <50% respiratory   variability.   Cardiac Monitor 08/14/2018 The rhythm throughout was sinus with minimum average and maximum heart rates of 53, 88 and 178 bpm.  The maximum heart rate was sinus tachycardia.   There was no pauses of 3 seconds or greater and no episodes of sinus node or AV block.   Ventricular ectopy was rare with isolated PVC   Supraventricular ectopy was rare with isolated APCs.  There are no episodes of atrial fibrillation flutter or SVT.   There was one diary entry of chest pain with sinus rhythm 74 bpm     Recent Labs: 02/21/2022: BUN 15; Creatinine, Ser 1.05; Hemoglobin 12.9; Platelets 338; Potassium 3.3; Sodium 135  Recent Lipid Panel No results found for: "CHOL", "TRIG", "HDL", "CHOLHDL", "VLDL", "LDLCALC", "LDLDIRECT"   Risk Assessment/Calculations:       Physical Exam:    VS:  LMP 01/04/2020 (Exact Date)     Wt Readings from Last 3 Encounters:  02/26/22 205 lb 6.4 oz (93.2 kg)  02/21/22 205 lb 14.6 oz (93.4 kg)  02/24/21 206 lb (93.4 kg)     GEN:  Well nourished, well developed in no acute distress HEENT: Normal NECK: No JVD; No carotid bruits CARDIAC: RRR, no murmurs, rubs, gallops RESPIRATORY:  Clear to auscultation without rales, wheezing or rhonchi  ABDOMEN: Soft, non-tender, non-distended MUSCULOSKELETAL:  No edema; No deformity. 2+ pedal pulses, equal bilaterally SKIN: Warm and dry NEUROLOGIC:  Alert and oriented x 3 PSYCHIATRIC:  Normal affect   EKG:  EKG is not ordered today.     Diagnoses:    No diagnosis found.  Assessment and Plan:     Abnormal EKG: EKG 02/21/22 revealed question of new TWI in lead III and documentation of LVH. She has tall peaked QRS complexes that have been present on past EKGs. Older EKGs have presence of flattened T waves. No chest pain, normal troponin. Advised continued monitoring and healthy lifestyle as noted below.   Left arm pain: Intense left arm pain that led to ED visit 02/21/22 has improved. Occasional mild pain in  left arm described as numbness. Admits she got anxious because of family hx of early CAD. Encouraged her to monitor for numbness, symptoms that may be 2/2 nerve impingement and report to PCP.   Cardiac risk counseling: We discussed her coronary CTA in 2020 with no evidence of CAD, calcium score of 0.  Encouraged heart healthy, mostly plant based diet, 150 minutes of moderate intensity exercise each week, good BP control, and good cholesterol control for heart health. LDL elevated at 167 June 2023. She is working on better diet and exercise regimen. I will see her  back in 6 months to readdress.   Hypertension: BP is well controlled today. Recent medication change by PCP. Advised her to continue to monitor. She reports occasional episodes of tachycardia. Could consider low dose BB if additional BP control is needed.      Disposition: ***  Medication Adjustments/Labs and Tests Ordered: Current medicines are reviewed at length with the patient today.  Concerns regarding medicines are outlined above.  No orders of the defined types were placed in this encounter.  No orders of the defined types were placed in this encounter.   There are no Patient Instructions on file for this visit.   Signed, Levi Aland, NP  07/24/2022 7:50 AM    [facility]

## 2022-07-30 ENCOUNTER — Ambulatory Visit: Payer: BC Managed Care – PPO | Attending: Nurse Practitioner | Admitting: Nurse Practitioner

## 2022-07-30 ENCOUNTER — Encounter: Payer: Self-pay | Admitting: Nurse Practitioner

## 2022-09-13 ENCOUNTER — Encounter (HOSPITAL_BASED_OUTPATIENT_CLINIC_OR_DEPARTMENT_OTHER): Payer: Self-pay | Admitting: Emergency Medicine

## 2022-09-13 ENCOUNTER — Emergency Department (HOSPITAL_BASED_OUTPATIENT_CLINIC_OR_DEPARTMENT_OTHER)
Admission: EM | Admit: 2022-09-13 | Discharge: 2022-09-13 | Disposition: A | Discharge status: Home / Self Care | Payer: BC Managed Care – PPO | Attending: Emergency Medicine | Admitting: Emergency Medicine

## 2022-09-13 ENCOUNTER — Emergency Department (HOSPITAL_BASED_OUTPATIENT_CLINIC_OR_DEPARTMENT_OTHER): Payer: BC Managed Care – PPO

## 2022-09-13 ENCOUNTER — Other Ambulatory Visit: Payer: Self-pay

## 2022-09-13 DIAGNOSIS — M79602 Pain in left arm: Secondary | ICD-10-CM | POA: Insufficient documentation

## 2022-09-13 DIAGNOSIS — E876 Hypokalemia: Secondary | ICD-10-CM | POA: Insufficient documentation

## 2022-09-13 DIAGNOSIS — R6884 Jaw pain: Secondary | ICD-10-CM | POA: Diagnosis present

## 2022-09-13 DIAGNOSIS — I2510 Atherosclerotic heart disease of native coronary artery without angina pectoris: Secondary | ICD-10-CM | POA: Diagnosis not present

## 2022-09-13 LAB — BASIC METABOLIC PANEL
Anion gap: 10 (ref 5–15)
BUN: 12 mg/dL (ref 6–20)
CO2: 31 mmol/L (ref 22–32)
Calcium: 9.2 mg/dL (ref 8.9–10.3)
Chloride: 96 mmol/L — ABNORMAL LOW (ref 98–111)
Creatinine, Ser: 1.07 mg/dL — ABNORMAL HIGH (ref 0.44–1.00)
GFR, Estimated: >60 mL/min (ref >60)
Glucose, Bld: 127 mg/dL — ABNORMAL HIGH (ref 70–99)
Potassium: 2.8 mmol/L — ABNORMAL LOW (ref 3.5–5.1)
Sodium: 137 mmol/L (ref 135–145)

## 2022-09-13 LAB — CBC
HCT: 38.6 % (ref 36.0–46.0)
Hemoglobin: 12.5 g/dL (ref 12.0–15.0)
MCH: 25.3 pg — ABNORMAL LOW (ref 26.0–34.0)
MCHC: 32.4 g/dL (ref 30.0–36.0)
MCV: 78 fL — ABNORMAL LOW (ref 80.0–100.0)
Platelets: 295 10*3/uL (ref 150–400)
RBC: 4.95 MIL/uL (ref 3.87–5.11)
RDW: 13.3 % (ref 11.5–15.5)
WBC: 5.5 10*3/uL (ref 4.0–10.5)
nRBC: 0 % (ref 0.0–0.2)

## 2022-09-13 LAB — TROPONIN I (HIGH SENSITIVITY): Troponin I (High Sensitivity): 4 ng/L (ref <18)

## 2022-09-13 MED ORDER — POTASSIUM CHLORIDE CRYS ER 20 MEQ PO TBCR: 40.0000 meq | EXTENDED_RELEASE_TABLET | Freq: Every day | ORAL | 0 refills | Status: DC

## 2022-09-13 MED ORDER — HYDROCODONE-ACETAMINOPHEN 5-325 MG PO TABS
1.0000 | ORAL_TABLET | Freq: Once | ORAL | Status: AC
  Administered 2022-09-13: 1 via ORAL
  Filled 2022-09-13: qty 1

## 2022-09-13 MED ORDER — POTASSIUM CHLORIDE CRYS ER 20 MEQ PO TBCR
40.0000 meq | EXTENDED_RELEASE_TABLET | Freq: Once | ORAL | Status: DC
  Filled 2022-09-13: qty 2

## 2022-09-13 MED ORDER — POTASSIUM CHLORIDE 20 MEQ PO PACK
40.0000 meq | PACK | Freq: Once | ORAL | Status: AC
  Administered 2022-09-13: 40 meq via ORAL
  Filled 2022-09-13: qty 2

## 2022-09-13 MED ORDER — ONDANSETRON 4 MG PO TBDP
8.0000 mg | ORAL_TABLET | Freq: Once | ORAL | Status: AC
  Administered 2022-09-13: 8 mg via ORAL
  Filled 2022-09-13: qty 2

## 2022-09-13 NOTE — ED Provider Notes (Signed)
[facility] [facility] Provider Note   CSN: [id number] Arrival date & time: 09/13/22  1932     History  Chief Complaint  Patient presents with   Tingling   Jaw Pain    Terry Bryant is a 47 y.o. female.  47 year old female with past medical history significant for hypertension, hyperlipidemia, cardiomegaly presents today for concern of right-sided jaw pain that is constant and left arm pain.  No chest pain, or dyspnea.  No peripheral edema.  Reports significant history of CAD for her father who passed away from a heart attack at the age of 74.  She sees cardiology.  Last saw them 6 months ago.  Denies other complaints.  The history is provided by the patient. No language interpreter was used.       Home Medications Prior to Admission medications   Medication Sig Start Date End Date Taking? Authorizing Provider  diclofenac Sodium (VOLTAREN) 1 % GEL APPLY 2 GRAMS TOPICALLY TO THE AFFECTED AREA THREE TIMES DAILY FOR 5 DAYS    [provider]  fluticasone (FLONASE) 50 MCG/ACT nasal spray Place 1 spray into both nostrils daily as needed for allergies.     Alver Fisher, RN  iron polysaccharides (FERREX 150) 150 MG capsule     [provider]  montelukast (SINGULAIR) 10 MG tablet Take 10 mg by mouth daily as needed (allergies).    Alver Fisher, RN  norethindrone (AYGESTIN) 5 MG tablet TAKE 1 TABLET(5 MG) BY MOUTH DAILY 07/19/22   Cross, Efraim Kaufmann D, NP  triamcinolone cream (KENALOG) 0.5 % Apply 1 application topically daily as needed (eczema).    [provider]  triamterene-hydrochlorothiazide (MAXZIDE-25) 37.5-25 MG tablet Take 1 tablet by mouth daily.    Alver Fisher, RN      Allergies    Bee venom and Penicillins    Review of Systems   Review of Systems  Constitutional:  Negative for fever.  Respiratory:  Negative for cough and shortness of breath.   Cardiovascular:  Negative for chest pain.   Musculoskeletal:  Positive for myalgias.  Neurological:  Negative for light-headedness.  All other systems reviewed and are negative.   Physical Exam Updated Vital Signs BP 126/84 (BP Location: Right Arm)   Pulse 85   Temp 98.1 F (36.7 C) (Oral)   Resp 20   Ht 5\' 5"  (1.651 m)   Wt 91.6 kg   LMP 01/04/2020 (Exact Date)   SpO2 96%   BMI 33.61 kg/m  Physical Exam Vitals and nursing note reviewed.  Constitutional:      General: She is not in acute distress.    Appearance: Normal appearance. She is not ill-appearing.  HENT:     Head: Normocephalic and atraumatic.     Nose: Nose normal.  Eyes:     General: No scleral icterus.    Extraocular Movements: Extraocular movements intact.     Conjunctiva/sclera: Conjunctivae normal.  Cardiovascular:     Rate and Rhythm: Normal rate and regular rhythm.     Heart sounds: Normal heart sounds.  Pulmonary:     Effort: Pulmonary effort is normal. No respiratory distress.     Breath sounds: Normal breath sounds. No wheezing or rales.  Musculoskeletal:        General: Normal range of motion.     Cervical back: Normal range of motion.     Right lower leg: No edema.     Left lower leg: No edema.  Skin:    General: Skin is warm and dry.  Neurological:     General: No focal deficit present.     Mental Status: She is alert. Mental status is at baseline.     ED Results / Procedures / Treatments   Labs (all labs ordered are listed, but only abnormal results are displayed) Labs Reviewed  BASIC METABOLIC PANEL  CBC  TROPONIN I (HIGH SENSITIVITY)  TROPONIN I (HIGH SENSITIVITY)    EKG None  Radiology No results found.  Procedures Procedures    Medications Ordered in ED Medications  HYDROcodone-acetaminophen (NORCO/VICODIN) 5-325 MG per tablet 1 tablet (has no administration in time range)  ondansetron (ZOFRAN-ODT) disintegrating tablet 8 mg (has no administration in time range)    ED Course/ Medical Decision Making/ A&P                                  Medical Decision Making Amount and/or Complexity of Data Reviewed Labs: ordered. Radiology: ordered.  Risk Prescription drug management.   Medical Decision Making / ED Course   47 year old female with past medical history significant for hypertension, hyperlipidemia who presents today for right-sided jaw pain, and left arm pain.  No chest pain, dyspnea, or palpitations.  Does follow with cardiology last 6 months ago but is unsure why.  No history of CAD.  This is evident from her cardiology visit from November 2021.  Low heart score of 3.  CBC without leukocytosis or anemia.  BMP does show hypokalemia 2.8.  Otherwise no acute concerns.  Initial troponin of 4.  Chest x-ray without acute cardiopulmonary process.  EKG without acute ischemic changes.  No evidence of ACS.  Given duration of symptoms and low heart score will defer repeat troponin.  40 mEq of potassium ordered.  5 days worth of potassium repletion ordered.  Discussed close follow-up with PCP within 3 to 4 days to have repeat potassium level obtained.  She is in agreement.  Pain is improved.  Discussed return precautions.  Patient voices understanding and is in agreement with plan.  Final Clinical Impression(s) / ED Diagnoses Final diagnoses:  Hypokalemia    Rx / DC Orders ED Discharge Orders          Ordered    potassium chloride SA (KLOR-CON M) 20 MEQ tablet  Daily        09/13/22 2248              Marita Kansas, PA-C 09/13/22 2259    Sloan Leiter, DO 09/18/22 0009

## 2022-09-13 NOTE — Discharge Instructions (Signed)
Your workup today was reassuring.  No evidence of heart attack.  Your potassium however was low.  You received a potassium supplement in the emergency department.  Additional supplement sent into the pharmacy for the next 5 days.  Please call your primary care doctor or your cardiologist to schedule a potassium level recheck by Monday.  For any concerning symptoms return to the emergency room.

## 2022-09-13 NOTE — ED Triage Notes (Signed)
Patient arrived via POV c/o right jaw pain with tingling down left arm 3 days. Patient states jaw pain started Monday, tingling is intermittent. Patient states 10/10 jaw pain. Patient is AO x 4, VS WDL, normal gait.

## 2023-10-17 ENCOUNTER — Other Ambulatory Visit: Payer: Self-pay | Admitting: Obstetrics and Gynecology

## 2023-10-17 DIAGNOSIS — Z1231 Encounter for screening mammogram for malignant neoplasm of breast: Secondary | ICD-10-CM

## 2023-10-31 ENCOUNTER — Encounter (HOSPITAL_BASED_OUTPATIENT_CLINIC_OR_DEPARTMENT_OTHER): Payer: Self-pay

## 2023-10-31 ENCOUNTER — Emergency Department (HOSPITAL_BASED_OUTPATIENT_CLINIC_OR_DEPARTMENT_OTHER)

## 2023-10-31 ENCOUNTER — Emergency Department (HOSPITAL_BASED_OUTPATIENT_CLINIC_OR_DEPARTMENT_OTHER)
Admission: EM | Admit: 2023-10-31 | Discharge: 2023-10-31 | Disposition: A | Discharge status: Home / Self Care | Attending: Emergency Medicine | Admitting: Emergency Medicine

## 2023-10-31 ENCOUNTER — Other Ambulatory Visit: Payer: Self-pay

## 2023-10-31 DIAGNOSIS — Z79899 Other long term (current) drug therapy: Secondary | ICD-10-CM | POA: Insufficient documentation

## 2023-10-31 DIAGNOSIS — M79641 Pain in right hand: Secondary | ICD-10-CM | POA: Diagnosis present

## 2023-10-31 DIAGNOSIS — I10 Essential (primary) hypertension: Secondary | ICD-10-CM | POA: Insufficient documentation

## 2023-10-31 DIAGNOSIS — E876 Hypokalemia: Secondary | ICD-10-CM

## 2023-10-31 LAB — CBC WITH DIFFERENTIAL/PLATELET
Abs Immature Granulocytes: 0.03 K/uL (ref 0.00–0.07)
Basophils Absolute: 0.1 K/uL (ref 0.0–0.1)
Basophils Relative: 1 %
Eosinophils Absolute: 0.4 K/uL (ref 0.0–0.5)
Eosinophils Relative: 5 %
HCT: 38.9 % (ref 36.0–46.0)
Hemoglobin: 12.4 g/dL (ref 12.0–15.0)
Immature Granulocytes: 0 %
Lymphocytes Relative: 43 %
Lymphs Abs: 3 K/uL (ref 0.7–4.0)
MCH: 24.9 pg — ABNORMAL LOW (ref 26.0–34.0)
MCHC: 31.9 g/dL (ref 30.0–36.0)
MCV: 78.3 fL — ABNORMAL LOW (ref 80.0–100.0)
Monocytes Absolute: 0.4 K/uL (ref 0.1–1.0)
Monocytes Relative: 6 %
Neutro Abs: 3.1 K/uL (ref 1.7–7.7)
Neutrophils Relative %: 45 %
Platelets: 328 K/uL (ref 150–400)
RBC: 4.97 MIL/uL (ref 3.87–5.11)
RDW: 13.3 % (ref 11.5–15.5)
WBC: 6.9 K/uL (ref 4.0–10.5)
nRBC: 0 % (ref 0.0–0.2)

## 2023-10-31 LAB — BASIC METABOLIC PANEL WITH GFR
Anion gap: 12 (ref 5–15)
BUN: 10 mg/dL (ref 6–20)
CO2: 28 mmol/L (ref 22–32)
Calcium: 9.5 mg/dL (ref 8.9–10.3)
Chloride: 97 mmol/L — ABNORMAL LOW (ref 98–111)
Creatinine, Ser: 0.88 mg/dL (ref 0.44–1.00)
GFR, Estimated: >60 mL/min (ref >60)
Glucose, Bld: 149 mg/dL — ABNORMAL HIGH (ref 70–99)
Potassium: 2.9 mmol/L — ABNORMAL LOW (ref 3.5–5.1)
Sodium: 137 mmol/L (ref 135–145)

## 2023-10-31 LAB — TROPONIN T, HIGH SENSITIVITY: Troponin T High Sensitivity: <15 ng/L (ref 0–19)

## 2023-10-31 MED ORDER — POTASSIUM CHLORIDE CRYS ER 20 MEQ PO TBCR: 20.0000 meq | EXTENDED_RELEASE_TABLET | Freq: Two times a day (BID) | ORAL | 0 refills | Status: AC

## 2023-10-31 MED ORDER — ACETAMINOPHEN 500 MG PO TABS
1000.0000 mg | ORAL_TABLET | Freq: Once | ORAL | Status: AC
  Administered 2023-10-31: 1000 mg via ORAL
  Filled 2023-10-31: qty 2

## 2023-10-31 MED ORDER — POTASSIUM CHLORIDE CRYS ER 20 MEQ PO TBCR
40.0000 meq | EXTENDED_RELEASE_TABLET | Freq: Once | ORAL | Status: AC
  Administered 2023-10-31: 40 meq via ORAL
  Filled 2023-10-31: qty 2

## 2023-10-31 NOTE — ED Notes (Signed)
 ED Provider at bedside.

## 2023-10-31 NOTE — Discharge Instructions (Signed)
 Potassium was mildly low today.  Recommend doing oral potassium for the next few days.  Follow-up with your primary care doctor return if symptoms worsen.

## 2023-10-31 NOTE — ED Triage Notes (Signed)
 Patient here POV from Home.  Endorses Right Hand pain for about 9 hours. Sitting at the desk with no known trauma when pain began. Sharp.  NAD noted during triage. A&Ox4. GCS 15. Ambulatory.

## 2023-10-31 NOTE — ED Provider Notes (Addendum)
 [place] [facility] Provider Note   CSN: [id number] Arrival date & time: 10/31/23  2205     Patient presents with: Hand Pain   Terry Bryant is a 48 y.o. female.   Patient here with sharp right hand pain for the last 9 hours.  She is notice that her blood pressure was elevated.  Her dad heart attack at a young age and she is concerned that this could be cardiac related.  But she denies any chest pain neck pain.  Pain is locally in the right hand.  She denies any trauma.  She denies any weakness numbness tingling.  No headache no vision loss no speech changes.  Denies any fevers or chills.  He has a history of high blood pressure and high cholesterol.  Medical chart says history of carpal tunnel and cervical radiculopathy.  Overall she denies any neck pain.  She denies any pain down the arm.  Denies any redness swelling.  The history is provided by the patient.       Prior to Admission medications   Medication Sig Start Date End Date Taking? Authorizing Provider  diclofenac Sodium (VOLTAREN) 1 % GEL APPLY 2 GRAMS TOPICALLY TO THE AFFECTED AREA THREE TIMES DAILY FOR 5 DAYS    [provider]  fluticasone (FLONASE) 50 MCG/ACT nasal spray Place 1 spray into both nostrils daily as needed for allergies.     Curt Myla SAILOR, RN  iron  polysaccharides (FERREX 150) 150 MG capsule     [provider]  montelukast (SINGULAIR) 10 MG tablet Take 10 mg by mouth daily as needed (allergies).    Lindner, Jodi N, RN  norethindrone  (AYGESTIN ) 5 MG tablet TAKE 1 TABLET(5 MG) BY MOUTH DAILY 07/19/22   Cross, Melissa D, NP  potassium chloride  SA (KLOR-CON  M) 20 MEQ tablet Take 2 tablets (40 mEq total) by mouth daily for 5 days. 09/13/22 09/18/22  Hildegard Loge, PA-C  triamcinolone cream (KENALOG) 0.5 % Apply 1 application topically daily as needed (eczema).    [provider]  triamterene -hydrochlorothiazide  (MAXZIDE -25) 37.5-25 MG tablet Take 1  tablet by mouth daily.    Curt Myla SAILOR, RN    Allergies: Bee venom and Penicillins    Review of Systems  Updated Vital Signs BP (!) 153/98 (BP Location: Left Arm)   Pulse 93   Temp 98.1 F (36.7 C)   Resp 18   Ht 5' 5 (1.651 m)   Wt 90.7 kg   LMP 01/04/2020 (Exact Date)   SpO2 96%   BMI 33.28 kg/m   Physical Exam Vitals and nursing note reviewed.  Constitutional:      General: She is not in acute distress.    Appearance: She is well-developed. She is not ill-appearing.  HENT:     Head: Normocephalic and atraumatic.     Nose: Nose normal.     Mouth/Throat:     Mouth: Mucous membranes are moist.  Eyes:     Extraocular Movements: Extraocular movements intact.     Conjunctiva/sclera: Conjunctivae normal.     Pupils: Pupils are equal, round, and reactive to light.  Cardiovascular:     Rate and Rhythm: Normal rate and regular rhythm.     Pulses: Normal pulses.     Heart sounds: Normal heart sounds. No murmur heard. Pulmonary:     Effort: Pulmonary effort is normal. No respiratory distress.     Breath sounds: Normal breath sounds.  Abdominal:  Palpations: Abdomen is soft.     Tenderness: There is no abdominal tenderness.  Musculoskeletal:        General: No swelling or tenderness.     Cervical back: Normal range of motion and neck supple. No rigidity or tenderness.     Comments: No midline spinal tenderness, she is not tender in the right hand, there is no redness or swelling, cannot really reproduce tingling sensation in her hand on Tinel testing  Skin:    General: Skin is warm and dry.     Capillary Refill: Capillary refill takes less than 2 seconds.     Findings: No erythema.  Neurological:     General: No focal deficit present.     Mental Status: She is alert and oriented to person, place, and time.     Cranial Nerves: No cranial nerve deficit.     Sensory: No sensory deficit.     Motor: No weakness.     Coordination: Coordination normal.     Comments:  5+ out of 5 strength throughout, normal sensation, no drift, normal finger-nose-finger, normal speech  Psychiatric:        Mood and Affect: Mood normal.     (all labs ordered are listed, but only abnormal results are displayed) Labs Reviewed  CBC WITH DIFFERENTIAL/PLATELET - Abnormal; Notable for the following components:      Result Value   MCV 78.3 (*)    MCH 24.9 (*)    All other components within normal limits  BASIC METABOLIC PANEL WITH GFR - Abnormal; Notable for the following components:   Potassium 2.9 (*)    Chloride 97 (*)    Glucose, Bld 149 (*)    All other components within normal limits  TROPONIN T, HIGH SENSITIVITY    EKG: EKG Interpretation Date/Time:  Thursday October 31 2023 22:32:57 EDT Ventricular Rate:  80 PR Interval:  138 QRS Duration:  93 QT Interval:  360 QTC Calculation: 416 R Axis:   86  Text Interpretation: Sinus rhythm Probable LVH with secondary repol abnrm Confirmed by Ruthe Cornet [phone]) on 10/31/2023 10:34:13 PM  Radiology: [facility] Chest Portable 1 View Result Date: 10/31/2023 CLINICAL DATA:  pain EXAM: PORTABLE CHEST 1 VIEW COMPARISON:  Chest x-ray 09/13/2022 FINDINGS: The heart and mediastinal contours are within normal limits. No focal consolidation. No pulmonary edema. No pleural effusion. No pneumothorax. No acute osseous abnormality. IMPRESSION: No active disease. Electronically Signed   By: Morgane  Naveau M.D.   On: 10/31/2023 22:57     Procedures   Medications Ordered in the ED  acetaminophen  (TYLENOL ) tablet 1,000 mg (1,000 mg Oral Given 10/31/23 2239)                                    Medical Decision Making Amount and/or Complexity of Data Reviewed Labs: ordered. Radiology: ordered.  Risk OTC drugs.   Terry Bryant is here with sharp right hand pain has been ongoing for 9 hours.  She has a history of hypertension high cholesterol.  She states her blood pressures are mildly elevated today she is concerned for  cardiac process.  Family history of cardiac process in the family with her dad having an MI in her 2s.  Patient denies any chest pain shortness of breath headache neck pain.  She has a history of carpal tunnel cervical radiculopathy per chart review.  Overall I think that this is likely some  sort of peripheral neuropathy process or some sort of mild carpal tunnel process but will evaluate for cardiac process to be conservative.  She is not having any symptoms to suggest a cervical radiculopathy.  She not having any symptoms to suggest a stroke.  She got normal strength and sensation throughout.  I have no concern for trauma.  She is not really tender to palpation of her right hand.  She has got good pulses and good color of her hand and doubt any arterial process or blood clot.  Will check CBC BMP troponin EKG and chest x-ray if unremarkable however follow-up with her primary care doctor outpatient and recommend Tylenol  and ibuprofen  otherwise. Per chart review, unremarkable coronary CT 3 years ago with no disease.  EKG shows nonspecific ST changes that are seen on prior EKGs.  I do not see any obvious ischemic changes.  I reviewed interpreted EKG and reviewed prior EKGs.  Overall lab work unremarkable.  Mild hypokalemia 2.9.  Will give oral repletion and some potassium for home but troponin normal.  Creatinine unremarkable.  Understands return precautions and recommend follow-up with primary care doctor.  This chart was dictated using voice recognition software.  Despite best efforts to proofread,  errors can occur which can change the documentation meaning.      Final diagnoses:  Right hand pain    ED Discharge Orders     None          Ruthe Cornet, DO 10/31/23 2246    Ruthe Cornet, DO 10/31/23 2250    Ruthe Cornet, DO 10/31/23 2317

## 2023-11-06 ENCOUNTER — Telehealth (HOSPITAL_COMMUNITY): Payer: Self-pay | Admitting: Obstetrics and Gynecology

## 2023-11-06 NOTE — Telephone Encounter (Signed)
 RNCM received  phone call from patient.  Patient states she was in ED 9/18 and given prescription for Potassium tablets and she cannot swallow, states she needs liquid.  Patient states she is at the Pharmacy now and they will not fill liquid since prescription is for tablets.  Patient was to follow up with PCP.  Encouraged patient to follow up with her PCP in the morning for prescription.  Patient is agreeable to this plan.

## 2024-03-17 ENCOUNTER — Other Ambulatory Visit: Payer: Self-pay | Admitting: Obstetrics and Gynecology

## 2024-03-17 DIAGNOSIS — Z1231 Encounter for screening mammogram for malignant neoplasm of breast: Secondary | ICD-10-CM
